# Patient Record
Sex: Male | Born: 1945 | Race: White | Hispanic: No | Marital: Married | Smoking: Never smoker
Health system: Southern US, Community
[De-identification: ages and names within clinical notes are randomized; demographics above are authoritative.]

## PROBLEM LIST (undated history)

## (undated) DIAGNOSIS — J189 Pneumonia, unspecified organism: Secondary | ICD-10-CM

## (undated) DIAGNOSIS — K629 Disease of anus and rectum, unspecified: Secondary | ICD-10-CM

## (undated) DIAGNOSIS — C801 Malignant (primary) neoplasm, unspecified: Secondary | ICD-10-CM

## (undated) DIAGNOSIS — I10 Essential (primary) hypertension: Secondary | ICD-10-CM

## (undated) DIAGNOSIS — M1990 Unspecified osteoarthritis, unspecified site: Secondary | ICD-10-CM

## (undated) DIAGNOSIS — H269 Unspecified cataract: Secondary | ICD-10-CM

## (undated) DIAGNOSIS — K219 Gastro-esophageal reflux disease without esophagitis: Secondary | ICD-10-CM

## (undated) DIAGNOSIS — E785 Hyperlipidemia, unspecified: Secondary | ICD-10-CM

## (undated) DIAGNOSIS — T7840XA Allergy, unspecified, initial encounter: Secondary | ICD-10-CM

## (undated) HISTORY — DX: Essential (primary) hypertension: I10

## (undated) HISTORY — PX: CATARACT EXTRACTION: SUR2

## (undated) HISTORY — PX: MOLE REMOVAL: SHX2046

## (undated) HISTORY — DX: Unspecified osteoarthritis, unspecified site: M19.90

## (undated) HISTORY — PX: NECK MASS EXCISION: SHX2079

## (undated) HISTORY — PX: TONSILLECTOMY: SUR1361

## (undated) HISTORY — DX: Allergy, unspecified, initial encounter: T78.40XA

## (undated) HISTORY — DX: Unspecified cataract: H26.9

## (undated) HISTORY — PX: COLONOSCOPY: SHX174

## (undated) HISTORY — DX: Gastro-esophageal reflux disease without esophagitis: K21.9

## (undated) HISTORY — DX: Hyperlipidemia, unspecified: E78.5

---

## 2010-10-17 LAB — HM COLONOSCOPY

## 2012-04-03 LAB — TSH: TSH: 2.37 u[IU]/mL (ref <=5.90)

## 2013-11-06 LAB — CBC AND DIFFERENTIAL
HEMATOCRIT: 41 % (ref 41–53)
HEMOGLOBIN: 14.7 g/dL (ref 13.5–17.5)
NEUTROS ABS: 59 /uL
Platelets: 210 10*3/uL (ref 150–399)
WBC: 5.8 10^3/mL

## 2013-11-06 LAB — BASIC METABOLIC PANEL
BUN: 18 mg/dL (ref 4–21)
CREATININE: 1.3 mg/dL (ref <=1.3)
GLUCOSE: 91 mg/dL
Potassium: 4.6 mmol/L (ref 3.4–5.3)
SODIUM: 141 mmol/L (ref 137–147)

## 2013-11-06 LAB — HEPATIC FUNCTION PANEL
ALK PHOS: 52 U/L (ref 25–125)
ALT: 12 U/L (ref 10–40)
AST: 14 U/L (ref 14–40)
Bilirubin, Total: 0.5 mg/dL

## 2013-11-06 LAB — LIPID PANEL
Cholesterol: 207 mg/dL — AB (ref 0–200)
HDL: 33 mg/dL — AB (ref 35–70)
LDL CALC: 151 mg/dL
LDL/HDL RATIO: 4.6
Triglycerides: 115 mg/dL (ref 40–160)

## 2014-05-16 LAB — PSA: PSA: 1.9

## 2014-06-04 DIAGNOSIS — M791 Myalgia, unspecified site: Secondary | ICD-10-CM | POA: Insufficient documentation

## 2014-06-04 DIAGNOSIS — K649 Unspecified hemorrhoids: Secondary | ICD-10-CM | POA: Insufficient documentation

## 2014-06-04 DIAGNOSIS — I10 Essential (primary) hypertension: Secondary | ICD-10-CM | POA: Insufficient documentation

## 2014-06-04 DIAGNOSIS — Z23 Encounter for immunization: Secondary | ICD-10-CM | POA: Insufficient documentation

## 2014-06-04 DIAGNOSIS — M25559 Pain in unspecified hip: Secondary | ICD-10-CM | POA: Insufficient documentation

## 2014-06-04 DIAGNOSIS — E7800 Pure hypercholesterolemia, unspecified: Secondary | ICD-10-CM | POA: Insufficient documentation

## 2014-06-04 DIAGNOSIS — E663 Overweight: Secondary | ICD-10-CM | POA: Insufficient documentation

## 2014-06-04 DIAGNOSIS — K589 Irritable bowel syndrome without diarrhea: Secondary | ICD-10-CM | POA: Insufficient documentation

## 2014-06-04 DIAGNOSIS — Z8669 Personal history of other diseases of the nervous system and sense organs: Secondary | ICD-10-CM | POA: Insufficient documentation

## 2014-06-04 DIAGNOSIS — L84 Corns and callosities: Secondary | ICD-10-CM | POA: Insufficient documentation

## 2014-09-23 DIAGNOSIS — I10 Essential (primary) hypertension: Secondary | ICD-10-CM | POA: Insufficient documentation

## 2014-09-23 DIAGNOSIS — Z789 Other specified health status: Secondary | ICD-10-CM | POA: Insufficient documentation

## 2014-09-23 DIAGNOSIS — N400 Enlarged prostate without lower urinary tract symptoms: Secondary | ICD-10-CM | POA: Insufficient documentation

## 2014-09-23 DIAGNOSIS — M1990 Unspecified osteoarthritis, unspecified site: Secondary | ICD-10-CM | POA: Insufficient documentation

## 2014-09-23 DIAGNOSIS — E559 Vitamin D deficiency, unspecified: Secondary | ICD-10-CM | POA: Insufficient documentation

## 2014-09-23 DIAGNOSIS — J309 Allergic rhinitis, unspecified: Secondary | ICD-10-CM | POA: Insufficient documentation

## 2014-09-23 DIAGNOSIS — L821 Other seborrheic keratosis: Secondary | ICD-10-CM | POA: Insufficient documentation

## 2014-09-23 DIAGNOSIS — K219 Gastro-esophageal reflux disease without esophagitis: Secondary | ICD-10-CM | POA: Insufficient documentation

## 2014-09-23 DIAGNOSIS — E7800 Pure hypercholesterolemia, unspecified: Secondary | ICD-10-CM | POA: Insufficient documentation

## 2014-10-30 DIAGNOSIS — Z961 Presence of intraocular lens: Secondary | ICD-10-CM | POA: Diagnosis not present

## 2014-10-30 DIAGNOSIS — H40013 Open angle with borderline findings, low risk, bilateral: Secondary | ICD-10-CM | POA: Diagnosis not present

## 2014-11-25 ENCOUNTER — Ambulatory Visit (INDEPENDENT_AMBULATORY_CARE_PROVIDER_SITE_OTHER): Payer: Medicare PPO | Admitting: Family Medicine

## 2014-11-25 ENCOUNTER — Encounter: Payer: Self-pay | Admitting: Family Medicine

## 2014-11-25 VITALS — BP 142/76 | HR 68 | Temp 98.0°F | Resp 16 | Ht 73.0 in | Wt 218.0 lb

## 2014-11-25 DIAGNOSIS — I10 Essential (primary) hypertension: Secondary | ICD-10-CM

## 2014-11-25 DIAGNOSIS — K219 Gastro-esophageal reflux disease without esophagitis: Secondary | ICD-10-CM

## 2014-11-25 DIAGNOSIS — E785 Hyperlipidemia, unspecified: Secondary | ICD-10-CM | POA: Diagnosis not present

## 2014-11-25 DIAGNOSIS — Z789 Other specified health status: Secondary | ICD-10-CM

## 2014-11-25 DIAGNOSIS — Z889 Allergy status to unspecified drugs, medicaments and biological substances status: Secondary | ICD-10-CM | POA: Diagnosis not present

## 2014-11-25 NOTE — Progress Notes (Signed)
Patient ID: Henry [name], male   DOB: 1945-06-28, 69 y.o.   MRN: [id number]   ORLIN KANN  MRN: [id number] DOB: [birth year]  Subjective:  HPI   1. Essential hypertension Patient is a 69 year old male who presents for follow up of his hypertension.  His last visit was 05/16/14.  His blood pressure  At that visit was 138/88.  No management changes were made at that time.  He reports good compliance and tolerance of his medicine.   - COMPLETE METABOLIC PANEL WITH GFR - TSH - CBC With Differential/Platelet  2. Gastroesophageal reflux disease, esophagitis presence not specified Patient reports that as long as he take the Omeprazole he has no difficulties.  3. Hyperlipidemia  Patient has been intolerant of statins.  He is currently not taking any medications. - Lipid Panel With LDL/HDL Ratio  4. Statin intolerance     Patient Active Problem List   Diagnosis Date Noted  . Allergic rhinitis 09/23/2014  . 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 Acid reflux 09/23/2014  . Hypercholesteremia 09/23/2014  . BP (high blood pressure) 09/23/2014  . Arthritis, degenerative 09/23/2014  . Basal cell papilloma 09/23/2014  . Drug intolerance 09/23/2014  . Avitaminosis D 09/23/2014  . Essential (primary) hypertension 06/04/2014  . Hemorrhoid 06/04/2014  . Adaptive colitis 06/04/2014  . Muscle ache 06/04/2014  . Excess weight 06/04/2014  . Hypercholesterolemia without hypertriglyceridemia 06/04/2014    Past Medical History  Diagnosis Date  . Allergy   . Arthritis   . Cataract   . GERD (gastroesophageal reflux disease)   . Hyperlipidemia   . Hypertension     History   Social History  . Marital Status: Married    Spouse Name: N/A  . Number of Children: N/A  . Years of Education: N/A   Occupational History  . Not on file.   Social History Main Topics  . Smoking status: Never Smoker   . Smokeless tobacco: Not on file  . Alcohol Use: 0.0 oz/week    0 Standard drinks or  equivalent per week     Comment: 2 beers 4-5 times per week  . Drug Use: No  . Sexual Activity: Not on file   Other Topics Concern  . Not on file   Social History Narrative    Outpatient Prescriptions Prior to Visit  Medication Sig Dispense Refill  . aspirin 81 MG tablet Take by mouth.    . cholecalciferol (VITAMIN D) 1000 UNITS tablet Take by mouth.    Marland Kitchen GLUCOSAMINE HCL PO Take by mouth.    Marland Kitchen lisinopril (PRINIVIL,ZESTRIL) 40 MG tablet Take by mouth.    Marland Kitchen omeprazole (PRILOSEC) 20 MG capsule Take by mouth.     No facility-administered medications prior to visit.    Allergies  Allergen Reactions  . Lovastatin Other (See Comments)  . Pravastatin Nausea And Vomiting    joint ache  . Pravastatin Sodium     joint ache  . Simvastatin Other (See Comments)    muscle ache muscle ache  . Statins   . Sulfa Antibiotics Rash and Itching    Review of Systems  Constitutional: Negative.   Respiratory: Negative.   Cardiovascular: Negative.   Neurological: Negative for dizziness and headaches.   Objective:  BP 142/76 mmHg  Pulse 68  Temp(Src) 98 F (36.7 C) (Oral)  Resp 16  Ht 6\' 1"  (1.854 m)  Wt 218 lb (98.884 kg)  BMI 28.77 kg/m2  Physical Exam  Constitutional: He is well-developed,  well-nourished, and in no distress.  Eyes: Conjunctivae and EOM are normal. Pupils are equal, round, and reactive to light.  Neck: Normal range of motion. Neck supple.  Cardiovascular: Normal rate, regular rhythm and normal heart sounds.   Pulmonary/Chest: Effort normal and breath sounds normal.  Skin: Skin is warm and dry.  Psychiatric: Mood, memory, affect and judgment normal.    Assessment and Plan :   1. Essential hypertension Patient stable.  Encouraged to work on exercise.  Will obtain labs today.  Continue medications.  Check readings at home and bring record in on next visit.  2. Gastroesophageal reflux disease, esophagitis presence not specified Well controlled on medication, no  changes.  3. Hyperlipidemia Patient intolerant of statins.  Will check levels today.  May need to add a different agent if levels go up.    4. Statin intolerance Discussed statin intolerance with the patient what it actually means.   Miguel Aschoff MD [facility] 11/25/2014 9:17 AM

## 2014-11-26 LAB — COMPREHENSIVE METABOLIC PANEL
ALT: 15 IU/L (ref 0–44)
AST: 14 IU/L (ref 0–40)
Albumin/Globulin Ratio: 1.7 (ref 1.1–2.5)
Albumin: 4.3 g/dL (ref 3.6–4.8)
Alkaline Phosphatase: 60 IU/L (ref 39–117)
BUN / CREAT RATIO: 14 (ref 10–22)
BUN: 19 mg/dL (ref 8–27)
Bilirubin Total: 0.6 mg/dL (ref 0.0–1.2)
CO2: 24 mmol/L (ref 18–29)
CREATININE: 1.34 mg/dL — AB (ref 0.76–1.27)
Calcium: 9.7 mg/dL (ref 8.6–10.2)
Chloride: 105 mmol/L (ref 97–108)
GFR calc Af Amer: 63 mL/min/{1.73_m2} (ref >59)
GFR calc non Af Amer: 55 mL/min/{1.73_m2} — ABNORMAL LOW (ref >59)
Globulin, Total: 2.6 g/dL (ref 1.5–4.5)
Glucose: 102 mg/dL — ABNORMAL HIGH (ref 65–99)
POTASSIUM: 4.7 mmol/L (ref 3.5–5.2)
Sodium: 142 mmol/L (ref 134–144)
Total Protein: 6.9 g/dL (ref 6.0–8.5)

## 2014-11-26 LAB — LIPID PANEL WITH LDL/HDL RATIO
Cholesterol, Total: 226 mg/dL — ABNORMAL HIGH (ref 100–199)
HDL: 33 mg/dL — AB (ref >39)
LDL Calculated: 169 mg/dL — ABNORMAL HIGH (ref 0–99)
LDL/HDL RATIO: 5.1 ratio — AB (ref 0.0–3.6)
TRIGLYCERIDES: 122 mg/dL (ref 0–149)
VLDL CHOLESTEROL CAL: 24 mg/dL (ref 5–40)

## 2014-11-26 LAB — TSH: TSH: 1.73 u[IU]/mL (ref 0.450–4.500)

## 2014-11-28 LAB — CBC WITH DIFFERENTIAL/PLATELET

## 2015-03-05 ENCOUNTER — Ambulatory Visit (INDEPENDENT_AMBULATORY_CARE_PROVIDER_SITE_OTHER): Payer: Medicare PPO

## 2015-03-05 DIAGNOSIS — Z23 Encounter for immunization: Secondary | ICD-10-CM

## 2015-03-12 DIAGNOSIS — M545 Low back pain, unspecified: Secondary | ICD-10-CM | POA: Insufficient documentation

## 2015-04-17 ENCOUNTER — Telehealth: Payer: Self-pay | Admitting: Family Medicine

## 2015-04-17 ENCOUNTER — Other Ambulatory Visit: Payer: Self-pay

## 2015-04-17 MED ORDER — LISINOPRIL 40 MG PO TABS: 40.0000 mg | ORAL_TABLET | Freq: Every day | ORAL | Status: DC

## 2015-04-17 NOTE — Telephone Encounter (Signed)
Pt contacted office for refill request on the following medications: lisinopril (PRINIVIL,ZESTRIL) 40 MG tablet to [facility] on [address] Thanks TNP

## 2015-04-19 DIAGNOSIS — M545 Low back pain: Secondary | ICD-10-CM | POA: Diagnosis not present

## 2015-05-29 ENCOUNTER — Ambulatory Visit (INDEPENDENT_AMBULATORY_CARE_PROVIDER_SITE_OTHER): Payer: Medicare Other | Admitting: Family Medicine

## 2015-05-29 ENCOUNTER — Encounter: Payer: Self-pay | Admitting: Family Medicine

## 2015-05-29 VITALS — BP 158/76 | HR 78 | Temp 98.1°F | Resp 16 | Ht 73.25 in | Wt 228.0 lb

## 2015-05-29 DIAGNOSIS — N529 Male erectile dysfunction, unspecified: Secondary | ICD-10-CM | POA: Diagnosis not present

## 2015-05-29 DIAGNOSIS — Z0000 Encounter for general adult medical examination without abnormal findings: Secondary | ICD-10-CM | POA: Diagnosis not present

## 2015-05-29 DIAGNOSIS — Z1211 Encounter for screening for malignant neoplasm of colon: Secondary | ICD-10-CM | POA: Diagnosis not present

## 2015-05-29 LAB — IFOBT (OCCULT BLOOD): IFOBT: NEGATIVE

## 2015-05-29 MED ORDER — SILDENAFIL CITRATE 100 MG PO TABS: 50.0000 mg | ORAL_TABLET | Freq: Every day | ORAL | Status: DC | PRN

## 2015-05-29 MED ORDER — OMEPRAZOLE 20 MG PO CPDR: 20.0000 mg | DELAYED_RELEASE_CAPSULE | Freq: Every day | ORAL | Status: DC

## 2015-05-29 MED ORDER — LISINOPRIL 40 MG PO TABS: 40.0000 mg | ORAL_TABLET | Freq: Every day | ORAL | Status: DC

## 2015-05-29 NOTE — Addendum Note (Signed)
Addended by: Arnette Norris on: 05/29/2015 12:31 PM   Modules accepted: Orders

## 2015-05-29 NOTE — Progress Notes (Signed)
Patient ID: Henry [name], male   DOB: [birth year], 70 y.o.   MRN: [id number]  Visit Date: 05/29/2015  Today's Provider: Wilhemena Durie, MD   Chief Complaint  Patient presents with  . Medicare Wellness   Subjective:   Henry [name] is a 70 y.o. male who presents today for his Subsequent Annual Wellness Visit. He feels well. 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 He reports he is sleeping well. Immunization History  Administered Date(s) Administered  . Influenza, High Dose Seasonal PF 03/05/2015  . Pneumococcal Conjugate-13 04/24/2013  . Pneumococcal Polysaccharide-23 05/16/2014  . Tdap 01/19/2005  . Zoster 03/30/2012   LAST Colonoscopy 12/17/10 hemorrhoids, repeat 5 to 10 years.   Review of Systems  Constitutional: Negative.   HENT: Negative.   Eyes: Negative.   Respiratory: Negative.   Cardiovascular: Negative.   Gastrointestinal: Negative.   Endocrine: Negative.   Genitourinary: Negative.   Musculoskeletal: Negative.   Skin: Negative.   Allergic/Immunologic: Negative.   Neurological: Negative.   Hematological: Negative.   Psychiatric/Behavioral: Negative.     Patient Active Problem List   Diagnosis Date Noted  . Low back pain 03/12/2015  . Allergic rhinitis 09/23/2014  . 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 Acid reflux 09/23/2014  . Hypercholesteremia 09/23/2014  . BP (high blood pressure) 09/23/2014  . Arthritis, degenerative 09/23/2014  . Basal cell papilloma 09/23/2014  . Drug intolerance 09/23/2014  . Avitaminosis D 09/23/2014  . Essential (primary) hypertension 06/04/2014  . Hemorrhoid 06/04/2014  . Adaptive colitis 06/04/2014  . Muscle ache 06/04/2014  . Excess weight 06/04/2014  . Hypercholesterolemia without hypertriglyceridemia 06/04/2014    Social History   Social History  . Marital Status: Married    Spouse Name: N/A  . Number of Children: N/A  . Years of Education: N/A    Occupational History  . Not on file.   Social History Main Topics  . Smoking status: Never Smoker   . Smokeless tobacco: Never Used  . Alcohol Use: 0.0 oz/week    0 Standard drinks or equivalent per week     Comment: 2 beers 4-5 times per week  . Drug Use: No  . Sexual Activity: Not on file   Other Topics Concern  . Not on file   Social History Narrative    Past Surgical History  Procedure Laterality Date  . Cataract extraction    . Neck mass excision    . Mole removal    . Tonsillectomy      His family history includes CAD in his mother; COPD in his sister; Heart attack in his father; Heart disease in his mother; Rheumatic fever in his mother.    Outpatient Prescriptions Prior to Visit  Medication Sig Dispense Refill  . aspirin 81 MG tablet Take by mouth.    . cholecalciferol (VITAMIN D) 1000 UNITS tablet Take by mouth.    Marland Kitchen GLUCOSAMINE HCL PO Take by mouth.    Marland Kitchen lisinopril (PRINIVIL,ZESTRIL) 40 MG tablet Take 1 tablet (40 mg total) by mouth daily. 30 tablet 12  . omeprazole (PRILOSEC) 20 MG capsule Take by mouth.     No facility-administered medications prior to visit.    Allergies  Allergen Reactions  . Lovastatin Other (See Comments)  . Pravastatin Nausea And Vomiting    joint ache  . Pravastatin Sodium     joint ache  . Simvastatin Other (See Comments)    muscle ache  muscle ache  . Statins   . Sulfa Antibiotics Rash and Itching    Patient Care Team: Jerrol Banana., MD as PCP - General (Family Medicine)  Objective:   Vitals:  Filed Vitals:   05/29/15 0910  BP: 158/76  Pulse: 78  Temp: 98.1 F (36.7 C)  Resp: 16  Height: 6' 1.25" (1.861 m)  Weight: 228 lb (103.42 kg)    Physical Exam  Activities of Daily Living In your present state of health, do you have any difficulty performing the following activities: 05/29/2015  Hearing? Y  Vision? N  Difficulty concentrating or making decisions? N  Walking or climbing stairs? N   Dressing or bathing? N  Doing errands, shopping? N    Fall Risk Assessment Fall Risk  05/29/2015  Falls in the past year? No     Depression Screen PHQ 2/9 Scores 05/29/2015  PHQ - 2 Score 0    Cognitive Testing - 6-CIT    Year: 0 4 points  Month: 0 3 points  Memorize "Pia Mau, [address] [place]"  Time (within 1 hour:) 0 3 points  Count backwards from 20: 0 2 4 points  Name months of year: 0 2 4 points  Repeat Address: 0 2 4 6 8 10  points   Total Score: 6/28  Interpretation : Normal (0-7) Abnormal (8-28)    Assessment & Plan:     Annual Wellness Visit  Reviewed patient's Family Medical History Reviewed and updated list of patient's medical providers Assessment of cognitive impairment was done Assessed patient's functional ability Established a written schedule for health screening [place] Completed and Reviewed   ED Discussed issue at length. Try Viagra 100 mg daily when necessary. #5 with year refills. Chronic low back pain [facility] 05/29/2015 9:10 AM  ------------------------------------------------------------------------------------------------------------

## 2015-06-26 ENCOUNTER — Other Ambulatory Visit: Payer: Self-pay

## 2015-06-26 MED ORDER — SILDENAFIL CITRATE 20 MG PO TABS: ORAL_TABLET | ORAL | Status: DC

## 2015-07-23 ENCOUNTER — Other Ambulatory Visit: Payer: Self-pay | Admitting: Family Medicine

## 2015-07-23 NOTE — Telephone Encounter (Signed)
Pt contacted office for refill request on the following medications: sildenafil (REVATIO) 20 MG tablet Pt stated that RX was written for 10 pills and he would like it to be changed to 25 pills per refill sent to [facility]. I advised pt that Dr. Rosanna Randy is out of the office until Monday 07/28/15. Pt stated no hurry. Please advise. Thanks TNP

## 2015-07-23 NOTE — Telephone Encounter (Signed)
Please review-aa 

## 2015-07-24 ENCOUNTER — Other Ambulatory Visit: Payer: Self-pay | Admitting: Family Medicine

## 2015-07-24 NOTE — Telephone Encounter (Signed)
For review by Dr. Rosanna Randy when he is back in the office.

## 2015-07-28 MED ORDER — SILDENAFIL CITRATE 20 MG PO TABS: ORAL_TABLET | ORAL | Status: DC

## 2015-07-28 NOTE — Telephone Encounter (Signed)
Done-aa 

## 2015-07-28 NOTE — Telephone Encounter (Signed)
Ok to change to 25 tabs.12rf.

## 2015-09-30 ENCOUNTER — Ambulatory Visit (INDEPENDENT_AMBULATORY_CARE_PROVIDER_SITE_OTHER): Payer: Medicare Other | Admitting: Family Medicine

## 2015-09-30 VITALS — BP 146/92 | HR 64 | Temp 98.3°F | Resp 16 | Wt 228.0 lb

## 2015-09-30 DIAGNOSIS — N529 Male erectile dysfunction, unspecified: Secondary | ICD-10-CM | POA: Diagnosis not present

## 2015-09-30 DIAGNOSIS — I10 Essential (primary) hypertension: Secondary | ICD-10-CM | POA: Diagnosis not present

## 2015-09-30 DIAGNOSIS — E663 Overweight: Secondary | ICD-10-CM

## 2015-09-30 DIAGNOSIS — M545 Low back pain: Secondary | ICD-10-CM

## 2015-09-30 DIAGNOSIS — G8929 Other chronic pain: Secondary | ICD-10-CM

## 2015-09-30 NOTE — Progress Notes (Signed)
Patient ID: Henry [name], male   DOB: 05-05-1946, 70 y.o.   MRN: [id number]    Subjective:  HPI  Patient is here to follow up on chronic back pain. He has had a back pain that has been re current for 8 years, pain located in the lower back. Flare ups vary. 1 year ago he had daily back pain all summer long, but the next summer he felt fine. Recent episode started 3 weeks ago. He has been taking Ibuprofen. He has never had back surgeries or imaging done before. He received tens unit from [facility] place but has not been able to use it because he states he does not have anyone to help him with this. He denies leg weakness, any numbness or tingling.  He use to have chronic knee pain also for 20 years that was constant but that has resolved and he feels fine with that.  Prior to Admission medications   Medication Sig Start Date End Date Taking? Authorizing Provider  aspirin 81 MG tablet Take by mouth. 10/02/12  Yes Historical Provider, MD  cetirizine (ZYRTEC) 10 MG tablet Take 10 mg by mouth daily.   Yes Historical Provider, MD  cholecalciferol (VITAMIN D) 1000 UNITS tablet Take by mouth. 10/02/12  Yes Historical Provider, MD  GLUCOSAMINE HCL PO Take by mouth. 10/02/12  Yes Historical Provider, MD  lisinopril (PRINIVIL,ZESTRIL) 40 MG tablet Take 1 tablet (40 mg total) by mouth daily. 05/29/15  Yes Richard Maceo Pro., MD  omeprazole (PRILOSEC) 20 MG capsule Take 1 capsule (20 mg total) by mouth daily. 05/29/15  Yes Richard Maceo Pro., MD  sildenafil (REVATIO) 20 MG tablet Up to 5 tablets daily as needed 07/28/15  Yes Richard Maceo Pro., MD    Patient Active Problem List   Diagnosis Date Noted  . Low back pain 03/12/2015  . Allergic rhinitis 09/23/2014  . 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 Acid reflux 09/23/2014  . Hypercholesteremia 09/23/2014  . BP (high blood pressure) 09/23/2014  . Arthritis, degenerative 09/23/2014  . Basal cell papilloma 09/23/2014  . Drug intolerance  09/23/2014  . Avitaminosis D 09/23/2014  . Essential (primary) hypertension 06/04/2014  . Hemorrhoid 06/04/2014  . Adaptive colitis 06/04/2014  . Muscle ache 06/04/2014  . Excess weight 06/04/2014  . Hypercholesterolemia without hypertriglyceridemia 06/04/2014    Past Medical History  Diagnosis Date  . Allergy   . Arthritis   . Cataract   . GERD (gastroesophageal reflux disease)   . Hyperlipidemia   . Hypertension     Social History   Social History  . Marital Status: Married    Spouse Name: N/A  . Number of Children: N/A  . Years of Education: N/A   Occupational History  . Not on file.   Social History Main Topics  . Smoking status: Never Smoker   . Smokeless tobacco: Never Used  . Alcohol Use: 0.0 oz/week    0 Standard drinks or equivalent per week     Comment: 2 beers 4-5 times per week  . Drug Use: No  . Sexual Activity: Not on file   Other Topics Concern  . Not on file   Social History Narrative    Allergies  Allergen Reactions  . Lovastatin Other (See Comments)  . Pravastatin Nausea And Vomiting    joint ache  . Pravastatin Sodium     joint ache  . Simvastatin Other (See Comments)    muscle ache muscle ache  . Statins   .  Sulfa Antibiotics Rash and Itching    Review of Systems  Constitutional: Negative.   Respiratory: Negative.   Cardiovascular: Negative.   Gastrointestinal: Negative.   Musculoskeletal: Positive for back pain.  Psychiatric/Behavioral: Negative.     Immunization History  Administered Date(s) Administered  . Influenza, High Dose Seasonal PF 03/05/2015  . Pneumococcal Conjugate-13 04/24/2013  . Pneumococcal Polysaccharide-23 05/16/2014  . Tdap 01/19/2005  . Zoster 03/30/2012   Objective:  BP 146/92 mmHg  Pulse 64  Temp(Src) 98.3 F (36.8 C)  Resp 16  Wt 228 lb (103.42 kg)  Physical Exam  Constitutional: He is oriented to person, place, and time and well-developed, well-nourished, and in no distress.  HENT:    Head: Normocephalic and atraumatic.  Eyes: Conjunctivae are normal. Pupils are equal, round, and reactive to light.  Neck: Normal range of motion. Neck supple.  Cardiovascular: Normal rate, regular rhythm, normal heart sounds and intact distal pulses.   No murmur heard. Pulmonary/Chest: Breath sounds normal. No respiratory distress. He has no wheezes.  Musculoskeletal: He exhibits no edema or tenderness.  Above SI joint discomfort on the left but no tenderness today on the exam, negative straight leg raise.  Neurological: He is alert and oriented to person, place, and time.  Psychiatric: Mood, memory, affect and judgment normal.    Lab Results  Component Value Date   WBC CANCELED 11/25/2014   HGB 14.7 11/06/2013   HCT CANCELED 11/25/2014   PLT CANCELED 11/25/2014   GLUCOSE 102* 11/25/2014   CHOL 226* 11/25/2014   TRIG 122 11/25/2014   HDL 33* 11/25/2014   LDLCALC 169* 11/25/2014   TSH 1.730 11/25/2014   PSA 1.9 05/16/2014    CMP     Component Value Date/Time   NA 142 11/25/2014 0925   K 4.7 11/25/2014 0925   CL 105 11/25/2014 0925   CO2 24 11/25/2014 0925   GLUCOSE 102* 11/25/2014 0925   BUN 19 11/25/2014 0925   CREATININE 1.34* 11/25/2014 0925   CREATININE 1.3 11/06/2013   CALCIUM 9.7 11/25/2014 0925   PROT 6.9 11/25/2014 0925   ALBUMIN 4.3 11/25/2014 0925   AST 14 11/25/2014 0925   ALT 15 11/25/2014 0925   ALKPHOS 60 11/25/2014 0925   BILITOT 0.6 11/25/2014 0925   GFRNONAA 55* 11/25/2014 0925   GFRAA 63 11/25/2014 0925    Assessment and Plan :  1. Chronic low back pain Encouraged patient to use tens unit and get his wife to help him apply it. Flare ups vary for this issue. Advised patient he can use Ibuprofen no more than half of the days of the month. Follow. Physical therapy certainly can help and a TENS unit can probably be very helpful. Rx Written for TENS unit 2. Excess weight Work on habits, loosing weight can help back pain also.  3. Essential  (primary) hypertension Elevated today. Patient is up 10 lbs, encouraged patient to walk and watch his b/p. Will re access on the next visit, may need to change medication then. Follow.  Patient was seen and examined by Dr. Eulas Post and note was scribed by Theressa Millard, RMA.    Miguel Aschoff MD [facility] 09/30/2015 9:41 AM

## 2015-12-10 ENCOUNTER — Telehealth: Payer: Self-pay

## 2015-12-10 NOTE — Telephone Encounter (Signed)
Nothing else that I know of. If he can put septum times a week and question the patient when he comes in next visit that be great. thank you

## 2015-12-10 NOTE — Telephone Encounter (Signed)
Spoke with Henry [name] from [place] in regards to the TENS Unit, we send them office notes about patient's back pain to get the unit covered. She states medicare will not cover the unit for back pain there has to be some other pain like in the legs or knees or having cervical/thoracic pain. Per last note when we saw him he denied having any other issues except the chronic back pain in the lumbar region. I wanted to let you know. She needs a call back letting her know if there is something else we missed in the note that would help get this covered if not I guess they will close the case, I am not sure I told her I would call her back. CB: [phone]

## 2015-12-10 NOTE — Telephone Encounter (Signed)
thx

## 2015-12-17 ENCOUNTER — Ambulatory Visit: Payer: Self-pay | Admitting: Family Medicine

## 2015-12-24 ENCOUNTER — Telehealth: Payer: Self-pay | Admitting: Family Medicine

## 2015-12-24 NOTE — Telephone Encounter (Signed)
Henry [name] with [facility] would like a letter on our letter head stating that the order for the Tens Unit was for back pain and the knee pain was written in error. She would like if faxed attention [facility] and faxed to [phone]. Please advise. Thanks TNP

## 2015-12-25 NOTE — Telephone Encounter (Signed)
Ok to write that--notes are for chronic back pain. TENS unit requesting came  I think with knee pain written on it

## 2015-12-25 NOTE — Telephone Encounter (Signed)
Letter is typed, waiting on approval from Dr. Rosanna Randy.  ED

## 2015-12-25 NOTE — Telephone Encounter (Signed)
Done and faxed ED

## 2015-12-25 NOTE — Telephone Encounter (Signed)
Please review. Thanks!  

## 2016-03-18 ENCOUNTER — Encounter: Payer: Self-pay | Admitting: Family Medicine

## 2016-03-18 ENCOUNTER — Ambulatory Visit (INDEPENDENT_AMBULATORY_CARE_PROVIDER_SITE_OTHER): Payer: Medicare Other | Admitting: Family Medicine

## 2016-03-18 VITALS — BP 142/78 | HR 70 | Temp 97.9°F | Resp 18 | Wt 230.0 lb

## 2016-03-18 DIAGNOSIS — Z23 Encounter for immunization: Secondary | ICD-10-CM

## 2016-03-18 DIAGNOSIS — L308 Other specified dermatitis: Secondary | ICD-10-CM | POA: Diagnosis not present

## 2016-03-18 DIAGNOSIS — I10 Essential (primary) hypertension: Secondary | ICD-10-CM | POA: Diagnosis not present

## 2016-03-18 DIAGNOSIS — S39012A Strain of muscle, fascia and tendon of lower back, initial encounter: Secondary | ICD-10-CM

## 2016-03-18 MED ORDER — AMLODIPINE BESYLATE 5 MG PO TABS: 5.0000 mg | ORAL_TABLET | Freq: Every day | ORAL | 3 refills | Status: DC

## 2016-03-18 MED ORDER — TRIAMCINOLONE ACETONIDE 0.1 % EX CREA: 1.0000 "application " | TOPICAL_CREAM | Freq: Two times a day (BID) | CUTANEOUS | 0 refills | Status: DC

## 2016-03-18 NOTE — Progress Notes (Signed)
Subjective:  HPI  Hypertension, follow-up:  BP Readings from Last 3 Encounters:  03/18/16 (!) 142/78  09/30/15 (!) 146/92  05/29/15 (!) 158/76    He was last seen for hypertension 6 months ago.  BP at that visit was 146/92. Management since that visit includes none. He reports good compliance with treatment. He is not having side effects.  He is exercising. Walks a lot and gardening. He is adherent to low salt diet.   Outside blood pressures are not being checked. He is experiencing none.  Patient denies chest pain, chest pressure/discomfort, claudication, dyspnea, exertional chest pressure/discomfort, fatigue, irregular heart beat, lower extremity edema, near-syncope, orthopnea, palpitations, paroxysmal nocturnal dyspnea and syncope.   Cardiovascular risk factors include advanced age (older than 71 for men, 30 for women), hypertension and male gender.   Wt Readings from Last 3 Encounters:  03/18/16 230 lb (104.3 kg)  09/30/15 228 lb (103.4 kg)  05/29/15 228 lb (103.4 kg)   ------------------------------------------------------------------------   Back pain- Pt reports that he hurt his back while riding on a boat. But the pain did not start until the day after while he was getting out of a chair. Back pain started 4 days ago.  He reports that the back pain is worse in the mornings and the more he moves the better it feels. He says sometimes it runs into his hip. He has tried Ibuprofen so this and it has not helped.   Prior to Admission medications   Medication Sig Start Date End Date Taking? Authorizing Provider  aspirin 81 MG tablet Take by mouth. 10/02/12   Historical Provider, MD  cetirizine (ZYRTEC) 10 MG tablet Take 10 mg by mouth daily.    Historical Provider, MD  cholecalciferol (VITAMIN D) 1000 UNITS tablet Take by mouth. 10/02/12   Historical Provider, MD  GLUCOSAMINE HCL PO Take by mouth. 10/02/12   Historical Provider, MD  lisinopril (PRINIVIL,ZESTRIL) 40 MG tablet  Take 1 tablet (40 mg total) by mouth daily. 05/29/15   Richard Maceo Pro., MD  omeprazole (PRILOSEC) 20 MG capsule Take 1 capsule (20 mg total) by mouth daily. 05/29/15   Richard Maceo Pro., MD  sildenafil (REVATIO) 20 MG tablet Up to 5 tablets daily as needed 07/28/15   Jerrol Banana., MD    Patient Active Problem List   Diagnosis Date Noted  . Low back pain 03/12/2015  . Allergic rhinitis 09/23/2014  . 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 Acid reflux 09/23/2014  . Hypercholesteremia 09/23/2014  . BP (high blood pressure) 09/23/2014  . Arthritis, degenerative 09/23/2014  . Basal cell papilloma 09/23/2014  . Drug intolerance 09/23/2014  . Avitaminosis D 09/23/2014  . Essential (primary) hypertension 06/04/2014  . Hemorrhoid 06/04/2014  . Adaptive colitis 06/04/2014  . Muscle ache 06/04/2014  . Excess weight 06/04/2014  . Hypercholesterolemia without hypertriglyceridemia 06/04/2014    Past Medical History:  Diagnosis Date  . Allergy   . Arthritis   . Cataract   . GERD (gastroesophageal reflux disease)   . Hyperlipidemia   . Hypertension     Social History   Social History  . Marital status: Married    Spouse name: N/A  . Number of children: N/A  . Years of education: N/A   Occupational History  . Not on file.   Social History Main Topics  . Smoking status: Never Smoker  . Smokeless tobacco: Never Used  . Alcohol use 0.0 oz/week     Comment: 2  beers 4-5 times per week  . Drug use: No  . Sexual activity: Not on file   Other Topics Concern  . Not on file   Social History Narrative  . No narrative on file    Allergies  Allergen Reactions  . Lovastatin Other (See Comments)  . Pravastatin Nausea And Vomiting    joint ache  . Pravastatin Sodium     joint ache  . Simvastatin Other (See Comments)    muscle ache muscle ache  . Statins   . Sulfa Antibiotics Rash and Itching    Review of Systems  Constitutional: Negative.   HENT: Negative.    Eyes: Negative.   Respiratory: Negative.   Cardiovascular: Negative.   Gastrointestinal: Negative.   Genitourinary: Negative.   Musculoskeletal: Positive for back pain.  Skin: Negative.   Neurological: Negative.   Endo/Heme/Allergies: Negative.   Psychiatric/Behavioral: Negative.     Immunization History  Administered Date(s) Administered  . Influenza, High Dose Seasonal PF 03/05/2015  . Pneumococcal Conjugate-13 04/24/2013  . Pneumococcal Polysaccharide-23 05/16/2014  . Tdap 01/19/2005  . Zoster 03/30/2012   Objective:  BP (!) 142/78 (BP Location: Left Arm, Patient Position: Sitting, Cuff Size: Normal)   Pulse 70   Temp 97.9 F (36.6 C) (Oral)   Resp 18   Wt 230 lb (104.3 kg)   BMI 30.14 kg/m   Physical Exam  Constitutional: He is oriented to person, place, and time and well-developed, well-nourished, and in no distress.  HENT:  Head: Normocephalic and atraumatic.  Right Ear: External ear normal.  Left Ear: External ear normal.  Nose: Nose normal.  Eyes: Conjunctivae and EOM are normal. Pupils are equal, round, and reactive to light.  Neck: Normal range of motion. Neck supple.  Cardiovascular: Normal rate, regular rhythm, normal heart sounds and intact distal pulses.   Pulmonary/Chest: Effort normal and breath sounds normal.  Abdominal: Soft.  Musculoskeletal: Normal range of motion. He exhibits no tenderness.  Neurological: He is alert and oriented to person, place, and time. He has normal reflexes. Gait normal. GCS score is 15.  Skin: Skin is warm and dry.  Mild eczema behind the left ear.  Psychiatric: Mood, memory, affect and judgment normal.    Lab Results  Component Value Date   WBC CANCELED 11/25/2014   HGB 14.7 11/06/2013   HCT CANCELED 11/25/2014   PLT CANCELED 11/25/2014   GLUCOSE 102 (H) 11/25/2014   CHOL 226 (H) 11/25/2014   TRIG 122 11/25/2014   HDL 33 (L) 11/25/2014   LDLCALC 169 (H) 11/25/2014   TSH 1.730 11/25/2014   PSA 1.9 05/16/2014      CMP     Component Value Date/Time   NA 142 11/25/2014 0925   K 4.7 11/25/2014 0925   CL 105 11/25/2014 0925   CO2 24 11/25/2014 0925   GLUCOSE 102 (H) 11/25/2014 0925   BUN 19 11/25/2014 0925   CREATININE 1.34 (H) 11/25/2014 0925   CALCIUM 9.7 11/25/2014 0925   PROT 6.9 11/25/2014 0925   ALBUMIN 4.3 11/25/2014 0925   AST 14 11/25/2014 0925   ALT 15 11/25/2014 0925   ALKPHOS 60 11/25/2014 0925   BILITOT 0.6 11/25/2014 0925   GFRNONAA 55 (L) 11/25/2014 0925   GFRAA 63 11/25/2014 0925    Assessment and Plan :  1. Essential hypertension Elevated the last 3 OV. Will add Amlodipine. Follow up 2 months.  - amLODipine (NORVASC) 5 MG tablet; Take 1 tablet (5 mg total) by mouth daily.  Dispense:  90 tablet; Refill: 3  2. Need for influenza vaccination  - Flu vaccine HIGH DOSE PF  3. Strain of lumbar region, initial encounter More than likely muscular. Instructed to use heating pad and call back if he does not continue to improve.  Recurrent issue. Physical therapy referral may be helpful in the future.  4. Other eczema  - triamcinolone cream (KENALOG) 0.1 %; Apply 1 application topically 2 (two) times daily. To affected area. Only use for a week at a time.  Dispense: 30 g; Refill: 0   HPI, Exam, and A&P Transcribed under the direction and in the presence of Richard L. Cranford Mon, MD  Electronically Signed: Webb Laws, CMA I have done the exam and reviewed the above chart and it is accurate to the best of my knowledge.  Miguel Aschoff MD [facility] 03/18/2016 8:25 AM

## 2016-04-02 ENCOUNTER — Telehealth: Payer: Self-pay | Admitting: Family Medicine

## 2016-04-02 NOTE — Telephone Encounter (Signed)
Pt has prescribed amLODipine (NORVASC) 5 MG tablet.  After he started taking it for several days.  He started having diarrhea.  He stopped the medication and the diarrhea stopped.  He does not know if it is coincidental or not.  He just wants to know if this would be a side effect of the amlodipine and if he should try to take it again and see what happens.  He does not want to have diarrhea though as a side effect.  He knows Dr. Rosanna Randy is out if the office for a week.  Thanks Con Memos

## 2016-04-02 NOTE — Telephone Encounter (Signed)
Please review and advise if you can-aa

## 2016-04-05 NOTE — Telephone Encounter (Signed)
Likely coincidental, but not impossible. Patient my try starting amlodipine again to see if it has the same effect. If it happens again, it will likely get better with time. But if it is intolerable to patient, he may stop the drug if he wishes and discuss alternatives with Dr. Rosanna Randy.

## 2016-04-05 NOTE — Telephone Encounter (Signed)
Please call patient Henry [name] sent it to me but I am not there today.-aa

## 2016-04-06 NOTE — Telephone Encounter (Signed)
Pt informed

## 2016-06-02 ENCOUNTER — Encounter: Payer: Self-pay | Admitting: Family Medicine

## 2016-07-01 ENCOUNTER — Other Ambulatory Visit: Payer: Self-pay | Admitting: Family Medicine

## 2016-07-15 ENCOUNTER — Encounter: Payer: Self-pay | Admitting: Family Medicine

## 2016-07-27 ENCOUNTER — Encounter: Payer: Medicare Other | Admitting: Family Medicine

## 2016-08-03 ENCOUNTER — Ambulatory Visit (INDEPENDENT_AMBULATORY_CARE_PROVIDER_SITE_OTHER): Payer: Medicare Other | Admitting: Family Medicine

## 2016-08-03 VITALS — BP 118/80 | HR 80 | Temp 98.3°F | Resp 16 | Ht 73.0 in | Wt 224.0 lb

## 2016-08-03 DIAGNOSIS — Z0000 Encounter for general adult medical examination without abnormal findings: Secondary | ICD-10-CM

## 2016-08-03 DIAGNOSIS — E7800 Pure hypercholesterolemia, unspecified: Secondary | ICD-10-CM

## 2016-08-03 DIAGNOSIS — I10 Essential (primary) hypertension: Secondary | ICD-10-CM | POA: Diagnosis not present

## 2016-08-03 DIAGNOSIS — Z1211 Encounter for screening for malignant neoplasm of colon: Secondary | ICD-10-CM

## 2016-08-03 LAB — IFOBT (OCCULT BLOOD): IFOBT: NEGATIVE

## 2016-08-03 NOTE — Progress Notes (Signed)
Patient: Henry [name], Male    DOB: 02-21-1946, 71 y.o.   MRN: [id number] Visit Date: 08/03/2016  Today's Provider: Wilhemena Durie, MD   Chief Complaint  Patient presents with  . Annual Exam   Subjective:  Henry [name] is a 71 y.o. male who presents today for health maintenance and complete physical. He feels well. He reports exercising none. He reports he is sleeping well. Family History --Mother died at 12 with Rheumatic Fever.Father died at 34 with ALS.  Immunization History  Administered Date(s) Administered  . Influenza, High Dose Seasonal PF 03/05/2015, 03/18/2016  . Pneumococcal Conjugate-13 04/24/2013  . Pneumococcal Polysaccharide-23 05/16/2014  . Tdap 01/19/2005  . Zoster 03/30/2012   12/27/10/ Colonoscopy-normal  Review of Systems  Constitutional: Negative.   HENT: Positive for tinnitus.   Eyes: Negative.   Respiratory: Negative.   Cardiovascular: Negative.   Gastrointestinal: Negative.   Endocrine: Negative.   Musculoskeletal: Negative.   Skin: Negative.   Allergic/Immunologic: Negative.   Neurological: Negative.   Hematological: Negative.   Psychiatric/Behavioral: Negative.     Social History   Social History  . Marital status: Married    Spouse name: N/A  . Number of children: N/A  . Years of education: N/A   Occupational History  . Not on file.   Social History Main Topics  . Smoking status: Never Smoker  . Smokeless tobacco: Never Used  . Alcohol use 0.0 oz/week     Comment: 2 beers 4-5 times per week  . Drug use: No  . Sexual activity: Not on file   Other Topics Concern  . Not on file   Social History Narrative  . No narrative on file    Patient Active Problem List   Diagnosis Date Noted  . Low back pain 03/12/2015  . Allergic rhinitis 09/23/2014  . 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 Acid reflux 09/23/2014  . Hypercholesteremia 09/23/2014  . BP (high blood pressure) 09/23/2014  . Arthritis, degenerative 09/23/2014   . Basal cell papilloma 09/23/2014  . Drug intolerance 09/23/2014  . Avitaminosis D 09/23/2014  . Essential (primary) hypertension 06/04/2014  . Hemorrhoid 06/04/2014  . Adaptive colitis 06/04/2014  . Muscle ache 06/04/2014  . Excess weight 06/04/2014  . Hypercholesterolemia without hypertriglyceridemia 06/04/2014    Past Surgical History:  Procedure Laterality Date  . CATARACT EXTRACTION    . MOLE REMOVAL    . NECK MASS EXCISION    . TONSILLECTOMY      His family history includes CAD in his mother; COPD in his sister; Heart attack in his father; Heart disease in his mother; Rheumatic fever in his mother.     Outpatient Encounter Prescriptions as of 08/03/2016  Medication Sig Note  . amLODipine (NORVASC) 5 MG tablet Take 1 tablet (5 mg total) by mouth daily.   Marland Kitchen aspirin 81 MG tablet Take by mouth. 09/23/2014: Received from: [facility]  . cetirizine (ZYRTEC) 10 MG tablet Take 10 mg by mouth daily.   . cholecalciferol (VITAMIN D) 1000 UNITS tablet Take by mouth. 09/23/2014: Received from: [facility]  . GLUCOSAMINE HCL PO Take by mouth. 09/23/2014: Received from: [facility]  . lisinopril (PRINIVIL,ZESTRIL) 40 MG tablet TAKE 1 TABLET (40 MG TOTAL) BY MOUTH DAILY.   Marland Kitchen omeprazole (PRILOSEC) 20 MG capsule TAKE 1 CAPSULE (20 MG TOTAL) BY MOUTH DAILY.   . sildenafil (REVATIO) 20 MG tablet Up to 5 tablets daily as needed   . triamcinolone cream (KENALOG) 0.1 %  Apply 1 application topically 2 (two) times daily. To affected area. Only use for a week at a time.    No facility-administered encounter medications on file as of 08/03/2016.     Patient Care Team: Jerrol Banana., MD as PCP - General (Family Medicine)      Objective:   Vitals:  Vitals:   08/03/16 1030  BP: 118/80  Pulse: 80  Resp: 16  Temp: 98.3 F (36.8 C)  TempSrc: Oral  Weight: 224 lb (101.6 kg)  Height: 6\' 1"  (1.854 m)    Physical Exam  Constitutional:  He is oriented to person, place, and time. He appears well-developed and well-nourished.  HENT:  Head: Normocephalic and atraumatic.  Right Ear: External ear normal.  Left Ear: External ear normal.  Nose: Nose normal.  Mouth/Throat: Oropharynx is clear and moist.  Eyes: Conjunctivae and EOM are normal. Pupils are equal, round, and reactive to light.  Neck: Normal range of motion. Neck supple.  Cardiovascular: Normal rate, regular rhythm, normal heart sounds and intact distal pulses.   Pulmonary/Chest: Effort normal and breath sounds normal.  Abdominal: Soft. Bowel sounds are normal.  Diathesis recti vs nontender ventral hernia.  Genitourinary: Rectum normal, prostate normal and penis normal.  Musculoskeletal: Normal range of motion.  Neurological: He is alert and oriented to person, place, and time.  Skin: Skin is warm and dry.  Psychiatric: He has a normal mood and affect. His behavior is normal. Judgment and thought content normal.   Current Exercise Habits: The patient does not participate in regular exercise at present    Fall Risk  08/03/2016 05/29/2015  Falls in the past year? No No    Depression Screen PHQ 2/9 Scores 08/03/2016 05/29/2015  PHQ - 2 Score 0 0  PHQ- 9 Score 0 -   Functional Status Survey: Is the patient deaf or have difficulty hearing?: Yes Does the patient have difficulty seeing, even when wearing glasses/contacts?: No Does the patient have difficulty concentrating, remembering, or making decisions?: No Does the patient have difficulty walking or climbing stairs?: No Does the patient have difficulty dressing or bathing?: No Does the patient have difficulty doing errands alone such as visiting a doctor's office or shopping?: No  Cognitive Testing - 6-CIT  Correct? Score   What year is it? yes 0 0 or 4  6What month is it? yes 0 0 or 3  Memorize:    Henry [name],  44,  [address]  [place],      What time is it? (within 1 hour) yes 0 0 or 3  Count backwards  from 20 yes 0 0, 2, or 4  Name the months of the year yes 0 0, 2, or 4  Repeat name & address above yes 0 0, 2, 4, 6, 8, or 10       TOTAL SCORE   6/28   Interpretation:  Normal  Normal (0-7) Abnormal (8-28)     Assessment & Plan:     Routine Health Maintenance and Physical Exam  Exercise Activities and Dietary recommendations Goals    None      Immunization History  Administered Date(s) Administered  . Influenza, High Dose Seasonal PF 03/05/2015, 03/18/2016  . Pneumococcal Conjugate-13 04/24/2013  . Pneumococcal Polysaccharide-23 05/16/2014  . Tdap 01/19/2005  . Zoster 03/30/2012    Health Maintenance  Topic Date Due  . Hepatitis C Screening  06/21/1945  . COLONOSCOPY  03/11/1998  . TETANUS/TDAP  01/20/2015  . INFLUENZA VACCINE  Completed  . PNA vac Low Risk Adult  Completed     Discussed health benefits of physical activity, and encouraged him to engage in regular exercise appropriate for his age and condition.    1. Visit for annual health examination  2. Essential (primary) hypertension  - CBC with Differential/Platelet - Comprehensive metabolic panel - TSH  3. Hypercholesteremia  - Lipid Panel With LDL/HDL Ratio  4. Colon cancer screening  - IFOBT POC (occult bld, rslt in office)   HPI, Exam and A&P Transcribed under the direction and in the presence of Miguel Aschoff, Brooke Bonito., MD. Electronically Signed: Althea Charon, RMA I have done the exam and reviewed the chart and it is accurate to the best of my knowledge. Development worker, community has been used and  any errors in dictation or transcription are unintentional. Miguel Aschoff M.D. [facility]

## 2016-08-04 LAB — COMPREHENSIVE METABOLIC PANEL
A/G RATIO: 1.6 (ref 1.2–2.2)
ALT: 12 IU/L (ref 0–44)
AST: 17 IU/L (ref 0–40)
Albumin: 4.2 g/dL (ref 3.6–4.8)
Alkaline Phosphatase: 74 IU/L (ref 39–117)
BUN/Creatinine Ratio: 11 (ref 10–24)
BUN: 14 mg/dL (ref 8–27)
Bilirubin Total: 0.5 mg/dL (ref 0.0–1.2)
CO2: 27 mmol/L (ref 18–29)
CREATININE: 1.31 mg/dL — AB (ref 0.76–1.27)
Calcium: 9.7 mg/dL (ref 8.6–10.2)
Chloride: 104 mmol/L (ref 96–106)
GFR, EST AFRICAN AMERICAN: 64 mL/min/{1.73_m2} (ref >59)
GFR, EST NON AFRICAN AMERICAN: 56 mL/min/{1.73_m2} — AB (ref >59)
GLOBULIN, TOTAL: 2.7 g/dL (ref 1.5–4.5)
GLUCOSE: 92 mg/dL (ref 65–99)
Potassium: 4.6 mmol/L (ref 3.5–5.2)
SODIUM: 143 mmol/L (ref 134–144)
TOTAL PROTEIN: 6.9 g/dL (ref 6.0–8.5)

## 2016-08-04 LAB — CBC WITH DIFFERENTIAL/PLATELET
BASOS ABS: 0.1 10*3/uL (ref 0.0–0.2)
BASOS: 1 %
EOS (ABSOLUTE): 0.2 10*3/uL (ref 0.0–0.4)
Eos: 4 %
HEMOGLOBIN: 15 g/dL (ref 13.0–17.7)
Hematocrit: 44.4 % (ref 37.5–51.0)
IMMATURE GRANS (ABS): 0 10*3/uL (ref 0.0–0.1)
IMMATURE GRANULOCYTES: 0 %
LYMPHS: 22 %
Lymphocytes Absolute: 1.4 10*3/uL (ref 0.7–3.1)
MCH: 30.5 pg (ref 26.6–33.0)
MCHC: 33.8 g/dL (ref 31.5–35.7)
MCV: 90 fL (ref 79–97)
MONOCYTES: 7 %
Monocytes Absolute: 0.5 10*3/uL (ref 0.1–0.9)
NEUTROS ABS: 4.2 10*3/uL (ref 1.4–7.0)
NEUTROS PCT: 66 %
PLATELETS: 217 10*3/uL (ref 150–379)
RBC: 4.91 x10E6/uL (ref 4.14–5.80)
RDW: 14.4 % (ref 12.3–15.4)
WBC: 6.3 10*3/uL (ref 3.4–10.8)

## 2016-08-04 LAB — LIPID PANEL WITH LDL/HDL RATIO
Cholesterol, Total: 222 mg/dL — ABNORMAL HIGH (ref 100–199)
HDL: 32 mg/dL — ABNORMAL LOW (ref >39)
LDL CALC: 152 mg/dL — AB (ref 0–99)
LDL/HDL RATIO: 4.8 ratio — AB (ref 0.0–3.6)
TRIGLYCERIDES: 191 mg/dL — AB (ref 0–149)
VLDL CHOLESTEROL CAL: 38 mg/dL (ref 5–40)

## 2016-08-04 LAB — TSH: TSH: 1.87 u[IU]/mL (ref 0.450–4.500)

## 2016-08-06 NOTE — Progress Notes (Signed)
Advised  ED 

## 2016-08-10 ENCOUNTER — Other Ambulatory Visit: Payer: Self-pay | Admitting: Family Medicine

## 2016-12-09 ENCOUNTER — Ambulatory Visit (INDEPENDENT_AMBULATORY_CARE_PROVIDER_SITE_OTHER): Payer: Medicare Other | Admitting: Family Medicine

## 2016-12-09 ENCOUNTER — Encounter: Payer: Self-pay | Admitting: Family Medicine

## 2016-12-09 VITALS — BP 150/94 | HR 73 | Temp 98.8°F | Resp 16 | Wt 225.2 lb

## 2016-12-09 DIAGNOSIS — R2232 Localized swelling, mass and lump, left upper limb: Secondary | ICD-10-CM

## 2016-12-09 NOTE — Progress Notes (Signed)
Subjective:     Patient ID: Henry [name], male   DOB: [birth year], 70 y.o.   MRN: [id number]  HPI  Chief Complaint  Patient presents with  . Arm Swelling    Patient comes in office today with concerns of swelling to his left underarm for the past 7 days. Patient denies any pain tenderness or redness.   States he accidentally discovered it while feeling under his arm. Denies pre-existing cyst.   Review of Systems     Objective:   Physical Exam  Constitutional: He appears well-developed and well-nourished. No distress.  Skin:  Left axilla with approx. 8 cm non-tender, mobile mass.       Assessment:    1. Axillary mass, left - Ambulatory referral to General Surgery    Plan:    F/u prn.

## 2016-12-09 NOTE — Patient Instructions (Signed)
We will call you with the surgical appointment.

## 2016-12-10 ENCOUNTER — Encounter: Payer: Self-pay | Admitting: General Surgery

## 2016-12-16 ENCOUNTER — Encounter: Payer: Self-pay | Admitting: General Surgery

## 2016-12-16 ENCOUNTER — Ambulatory Visit (INDEPENDENT_AMBULATORY_CARE_PROVIDER_SITE_OTHER): Payer: Medicare Other | Admitting: General Surgery

## 2016-12-16 ENCOUNTER — Other Ambulatory Visit (HOSPITAL_COMMUNITY)
Admission: RE | Admit: 2016-12-16 | Discharge: 2016-12-16 | Disposition: A | Discharge status: Home / Self Care | Payer: Medicare Other | Source: Ambulatory Visit | Attending: Anatomic Pathology & Clinical Pathology | Admitting: Anatomic Pathology & Clinical Pathology

## 2016-12-16 ENCOUNTER — Inpatient Hospital Stay: Payer: Self-pay

## 2016-12-16 VITALS — BP 124/70 | HR 74 | Resp 12 | Ht 73.0 in | Wt 222.0 lb

## 2016-12-16 DIAGNOSIS — R2232 Localized swelling, mass and lump, left upper limb: Secondary | ICD-10-CM | POA: Diagnosis not present

## 2016-12-16 NOTE — Progress Notes (Signed)
Patient ID: Henry [name], male   DOB: 1946/04/21, 71 y.o.   MRN: [id number]  Chief Complaint  Patient presents with  . Other    axillary mass    HPI Henry [name] is a 71 y.o. male here today for a evaluation of swelling and a ball under his left arm. Patient noticed this about two weeks ago. He denies any pain or redness.  He reports that the "ball" is gone but he still has some swelling in the area.  He reports that he does work in his garden daily.  HPI  Past Medical History:  Diagnosis Date  . Allergy   . Arthritis   . Cataract   . GERD (gastroesophageal reflux disease)   . Hyperlipidemia   . Hypertension     Past Surgical History:  Procedure Laterality Date  . CATARACT EXTRACTION    . MOLE REMOVAL    . NECK MASS EXCISION    . TONSILLECTOMY      Family History  Problem Relation Age of Onset  . Heart disease Mother   . Rheumatic fever Mother   . CAD Mother   . Heart attack Father   . COPD Sister     Social History Social History  Substance Use Topics  . Smoking status: Never Smoker  . Smokeless tobacco: Never Used  . Alcohol use 0.0 oz/week     Comment: 2 beers 4-5 times per week    Allergies  Allergen Reactions  . Lovastatin Other (See Comments)  . Pravastatin Nausea And Vomiting    joint ache  . Pravastatin Sodium     joint ache  . Simvastatin Other (See Comments)    muscle ache muscle ache  . Statins   . Sulfa Antibiotics Rash and Itching    Current Outpatient Prescriptions  Medication Sig Dispense Refill  . amLODipine (NORVASC) 5 MG tablet Take 1 tablet (5 mg total) by mouth daily. 90 tablet 3  . aspirin 81 MG tablet Take by mouth.    . cetirizine (ZYRTEC) 10 MG tablet Take 10 mg by mouth daily.    . cholecalciferol (VITAMIN D) 1000 UNITS tablet Take by mouth.    Marland Kitchen lisinopril (PRINIVIL,ZESTRIL) 40 MG tablet TAKE 1 TABLET (40 MG TOTAL) BY MOUTH DAILY. 90 tablet 2  . omeprazole (PRILOSEC) 20 MG capsule TAKE 1 CAPSULE (20 MG TOTAL) BY MOUTH  DAILY. 90 capsule 3  . sildenafil (REVATIO) 20 MG tablet UP TO 5 TABLETS DAILY AS NEEDED 50 tablet 11  . triamcinolone cream (KENALOG) 0.1 % Apply 1 application topically 2 (two) times daily. To affected area. Only use for a week at a time. 30 g 0   No current facility-administered medications for this visit.     Review of Systems Review of Systems  Constitutional: Negative.   Respiratory: Negative.   Cardiovascular: Negative.   Gastrointestinal: Negative.   Genitourinary: Negative.     Blood pressure 124/70, pulse 74, resp. rate 12, height 6\' 1"  (1.854 m), weight 222 lb (100.7 kg).  Physical Exam Physical Exam  Constitutional: He is oriented to person, place, and time. He appears well-developed and well-nourished.  Eyes: Conjunctivae are normal. No scleral icterus.  Neck: Neck supple.  Cardiovascular: Normal rate, regular rhythm and normal heart sounds.   Pulmonary/Chest: Effort normal and breath sounds normal.  Abdominal: Soft. Bowel sounds are normal. There is no tenderness. A hernia is present.    Lymphadenopathy:    He has no cervical adenopathy.  He has axillary adenopathy.       Right axillary: No pectoral and no lateral adenopathy present.       Left axillary: Lateral adenopathy present. No pectoral adenopathy present.      Right: No inguinal adenopathy present.       Left: No inguinal adenopathy present.  In the left axilla there is a 5 cm very firm smooth mobile mass.  Neurological: He is alert and oriented to person, place, and time.  Skin: Skin is warm and dry.   Ultrasound the left axilla was performed. There is a very large greater than 5 cm lobulated hypoechoic mass. No blood flow identified within the mass. This is consistent with abnormally enlarged suspicious lymph node or a group of lymph nodes matted lymph nodes.  Data Reviewed PCP notes  Assessment    Suspicious feeling and appearing large lymph node in the left axilla. Core biopsy is indicated.  Discussed fully with the patient and he is agreeable    Plan    core biopsy completed today. Await pathology report before deciding on further management     HPI, Physical Exam, Assessment and Plan have been scribed under the direction and in the presence of Mckinley Jewel, MD  Concepcion Living, LPN I have completed the exam and reviewed the above documentation for accuracy and completeness.  I agree with the above.  Haematologist has been used and any errors in dictation or transcription are unintentional.  Seeplaputhur G. Jamal Collin, M.D., F.A.C.S.   Junie Panning G 12/17/2016, 8:23 AM

## 2016-12-16 NOTE — Patient Instructions (Signed)
CARE AFTER BIOPSY  1. Leave the dressing on that your doctor applied after the biopsy. It is waterproof. You may bathe, shower and/or swim. The dressing can be removed in 3 days, you will see small strips of tape against your skin on the incision. Do not remove these strips they will gradually fall off in about 2-3 weeks. You may use an ice pack on and off for the first 12-24 hours for comfort.  2. You may want to use a gauze,cloth or similar protection to prevent rubbing against your dressing and incision. This is not necessary, but you may feel more comfortable doing so.  3. It is recommended that you wear a bra day and night to give support to the breast. This will prevent the weight of the breast from pulling on the incision.  4. The area may feel hard and lumpy under the incision. Do not be alarmed.  Softening of this tissue will occur in time.  5. You may have a follow up appointment or phone follow up in one week after your biopsy. The office phone number is [phone].  6. You will notice about a week or two after your office visit that the strips of the tape on your incision will begin to loosen. These may then be removed.  7. Report to your doctor any of the following:  * Severe pain not relieved by your pain medication  *Redness of the incision  * Drainage from the incision  *Fever greater than 101 degrees

## 2016-12-20 ENCOUNTER — Ambulatory Visit (INDEPENDENT_AMBULATORY_CARE_PROVIDER_SITE_OTHER): Payer: Medicare Other | Admitting: General Surgery

## 2016-12-20 ENCOUNTER — Encounter: Payer: Self-pay | Admitting: General Surgery

## 2016-12-20 VITALS — BP 140/84 | HR 82 | Resp 14 | Ht 73.0 in | Wt 219.0 lb

## 2016-12-20 DIAGNOSIS — C8334 Diffuse large B-cell lymphoma, lymph nodes of axilla and upper limb: Secondary | ICD-10-CM

## 2016-12-20 DIAGNOSIS — R2232 Localized swelling, mass and lump, left upper limb: Secondary | ICD-10-CM

## 2016-12-20 NOTE — Progress Notes (Signed)
Patient ID: Henry [name], male   DOB: 07/01/1945, 71 y.o.   MRN: [id number]  Chief Complaint  Patient presents with  . Follow-up    HPI Henry [name] is a 71 y.o. male here for discussion of pathology results following an axillary lymph node needle core biopsy. He reports no problems with the biopsy site.  HPI  Past Medical History:  Diagnosis Date  . Allergy   . Arthritis   . Cataract   . GERD (gastroesophageal reflux disease)   . Hyperlipidemia   . Hypertension     Past Surgical History:  Procedure Laterality Date  . CATARACT EXTRACTION    . MOLE REMOVAL    . NECK MASS EXCISION    . TONSILLECTOMY      Family History  Problem Relation Age of Onset  . Heart disease Mother   . Rheumatic fever Mother   . CAD Mother   . Heart attack Father   . COPD Sister     Social History Social History  Substance Use Topics  . Smoking status: Never Smoker  . Smokeless tobacco: Never Used  . Alcohol use 0.0 oz/week     Comment: 2 beers 4-5 times per week    Allergies  Allergen Reactions  . Lovastatin Other (See Comments)  . Pravastatin Nausea And Vomiting    joint ache  . Pravastatin Sodium     joint ache  . Simvastatin Other (See Comments)    muscle ache muscle ache  . Statins   . Sulfa Antibiotics Rash and Itching    Current Outpatient Prescriptions  Medication Sig Dispense Refill  . amLODipine (NORVASC) 5 MG tablet Take 1 tablet (5 mg total) by mouth daily. 90 tablet 3  . aspirin 81 MG tablet Take by mouth.    . cetirizine (ZYRTEC) 10 MG tablet Take 10 mg by mouth daily.    . cholecalciferol (VITAMIN D) 1000 UNITS tablet Take by mouth.    Marland Kitchen lisinopril (PRINIVIL,ZESTRIL) 40 MG tablet TAKE 1 TABLET (40 MG TOTAL) BY MOUTH DAILY. 90 tablet 2  . omeprazole (PRILOSEC) 20 MG capsule TAKE 1 CAPSULE (20 MG TOTAL) BY MOUTH DAILY. 90 capsule 3  . sildenafil (REVATIO) 20 MG tablet UP TO 5 TABLETS DAILY AS NEEDED 50 tablet 11  . triamcinolone cream (KENALOG) 0.1 % Apply 1  application topically 2 (two) times daily. To affected area. Only use for a week at a time. 30 g 0   No current facility-administered medications for this visit.     Review of Systems Review of Systems  Constitutional: Negative.   Respiratory: Negative.   Cardiovascular: Negative.     Blood pressure 140/84, pulse 82, resp. rate 14, height 6\' 1"  (1.854 m), weight 219 lb (99.3 kg).  Physical Exam Physical Exam  Constitutional: He is oriented to person, place, and time. He appears well-developed and well-nourished.  Neck: Neck supple.  Lymphadenopathy:    He has no cervical adenopathy.    He has axillary adenopathy (Axillary lymph node, no tenderness or evidence of regional inflammation).  Neurological: He is alert and oriented to person, place, and time.  Skin: Skin is warm and dry.  Psychiatric: He has a normal mood and affect.  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 Pt was agreeable.  Plan    Plan to schedule an appointment with the oncologist    HPI, Physical Exam, Assessment and Plan have been scribed under the direction and in the presence of Mckinley Jewel, MD.  Verlene Mayer, CMA  I have completed the exam and reviewed the above documentation for accuracy and completeness.  I agree with the above.  Haematologist has been used and any errors in dictation or transcription are unintentional.  Seeplaputhur G. Jamal Collin, M.D., F.A.C.S.  Junie Panning G 12/20/2016, 3:46 PM

## 2016-12-20 NOTE — Patient Instructions (Signed)
Plan to schedule appt with oncology

## 2016-12-21 ENCOUNTER — Telehealth: Payer: Self-pay | Admitting: *Deleted

## 2016-12-21 ENCOUNTER — Telehealth: Payer: Self-pay

## 2016-12-21 ENCOUNTER — Other Ambulatory Visit: Payer: Self-pay

## 2016-12-21 DIAGNOSIS — C8334 Diffuse large B-cell lymphoma, lymph nodes of axilla and upper limb: Secondary | ICD-10-CM

## 2016-12-21 NOTE — Telephone Encounter (Signed)
Patient has an appointment with Dr. Rogue Bussing 12/22/16 at 1:30pm. Patient was instructed of appointment and aware of time and date. Patient aware to call back with any questions or concerns.

## 2016-12-21 NOTE — Telephone Encounter (Signed)
Message left for patient to call for information on his scheduled PET scan.

## 2016-12-21 NOTE — Telephone Encounter (Signed)
Notified patient of PET scan scheduled for 12/23/16 at 12:30 pm. He will arrive at 12:00 pm and have noting to eat or drink after midnight. The patient is aware of date, time, and instructions.

## 2016-12-21 NOTE — Telephone Encounter (Signed)
The patient is scheduled for surgery at [facility] on 12/29/16. He will pre admit by phone. The patient is aware of date and instructions.

## 2016-12-22 ENCOUNTER — Other Ambulatory Visit: Payer: Self-pay | Admitting: General Surgery

## 2016-12-22 ENCOUNTER — Inpatient Hospital Stay: Payer: Medicare Other | Attending: Internal Medicine | Admitting: Internal Medicine

## 2016-12-22 ENCOUNTER — Inpatient Hospital Stay: Payer: Medicare Other

## 2016-12-22 DIAGNOSIS — Z5111 Encounter for antineoplastic chemotherapy: Secondary | ICD-10-CM | POA: Diagnosis not present

## 2016-12-22 DIAGNOSIS — I129 Hypertensive chronic kidney disease with stage 1 through stage 4 chronic kidney disease, or unspecified chronic kidney disease: Secondary | ICD-10-CM | POA: Diagnosis not present

## 2016-12-22 DIAGNOSIS — E785 Hyperlipidemia, unspecified: Secondary | ICD-10-CM | POA: Diagnosis not present

## 2016-12-22 DIAGNOSIS — C8334 Diffuse large B-cell lymphoma, lymph nodes of axilla and upper limb: Secondary | ICD-10-CM | POA: Insufficient documentation

## 2016-12-22 DIAGNOSIS — M1990 Unspecified osteoarthritis, unspecified site: Secondary | ICD-10-CM | POA: Insufficient documentation

## 2016-12-22 DIAGNOSIS — N181 Chronic kidney disease, stage 1: Secondary | ICD-10-CM | POA: Insufficient documentation

## 2016-12-22 DIAGNOSIS — Z7952 Long term (current) use of systemic steroids: Secondary | ICD-10-CM | POA: Insufficient documentation

## 2016-12-22 DIAGNOSIS — Z79899 Other long term (current) drug therapy: Secondary | ICD-10-CM

## 2016-12-22 DIAGNOSIS — I10 Essential (primary) hypertension: Secondary | ICD-10-CM | POA: Diagnosis not present

## 2016-12-22 DIAGNOSIS — Z7689 Persons encountering health services in other specified circumstances: Secondary | ICD-10-CM | POA: Insufficient documentation

## 2016-12-22 DIAGNOSIS — Z7982 Long term (current) use of aspirin: Secondary | ICD-10-CM | POA: Diagnosis not present

## 2016-12-22 DIAGNOSIS — K219 Gastro-esophageal reflux disease without esophagitis: Secondary | ICD-10-CM | POA: Diagnosis not present

## 2016-12-22 NOTE — Progress Notes (Signed)
[place] NOTE  Patient Care Team: Jerrol Banana., MD as PCP - General (Family Medicine) Carmon Ginsberg, [place] as Referring Physician (Family Medicine) Christene Lye, MD (General Surgery)  CHIEF COMPLAINTS/PURPOSE OF CONSULTATION:  Lymphoma  #  Oncology History   # AUG 6th 2018- DLBCL [LEFT axilla- Core Bx- Dr.Sankar; CD-20, bcl-2;bcl-6 positive]; Ki- 67 >90%.      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  male referred to [place] for further evaluation and recommendations for new diagnosis of diffuse large B-cell lymphoma.  Patient states that he noted to have a lump under his left underarm approximately 3 weeks ago. This is promptly evaluated by PCP followed by referral to Dr. Jamal Collin for further evaluation. Patient states the lump has not gotten any worse; maybe slightly smaller over the last 3 weeks or so.  Patient denies any pain. Denies any night sweats. He denies any lumps or bumps anywhere else. No nausea no vomiting. No headaches. No night sweats no fevers or chills. Patient denies any history of tingling and numbness in feet.    ROS: A complete 10 point review of system is done which is negative except mentioned above in history of present illness  MEDICAL HISTORY:  Past Medical History:  Diagnosis Date  . Allergy   . Arthritis   . Cataract   . GERD (gastroesophageal reflux disease)   . Hyperlipidemia   . Hypertension     SURGICAL HISTORY: Past Surgical History:  Procedure Laterality Date  . CATARACT EXTRACTION    . MOLE REMOVAL    . NECK MASS EXCISION    . TONSILLECTOMY      SOCIAL HISTORY:lives in [place], never smoked; 2 beers/ day. 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  Social History   Social History  . Marital status: Married    Spouse name: N/A  . Number of children: N/A  . Years of education: N/A   Occupational  History  . Not on file.   Social History Main Topics  . Smoking status: Never Smoker  . Smokeless tobacco: Never Used  . Alcohol use 0.0 oz/week     Comment: 2 beers 4-5 times per week  . Drug use: No  . Sexual activity: Not on file   Other Topics Concern  . Not on file   Social History Narrative  . No narrative on file    FAMILY HISTORY: Family History  Problem Relation Age of Onset  . Heart disease Mother   . Rheumatic fever Mother   . CAD Mother   . Heart attack Father   . COPD Sister     ALLERGIES:  is allergic to lovastatin; pravastatin; pravastatin sodium; simvastatin; statins; and sulfa antibiotics.  MEDICATIONS:  Current Outpatient Prescriptions  Medication Sig Dispense Refill  . amLODipine (NORVASC) 5 MG tablet Take 1 tablet (5 mg total) by mouth daily. 90 tablet 3  . aspirin 81 MG tablet Take by mouth.    . cetirizine (ZYRTEC) 10 MG tablet Take 10 mg by mouth daily.    . cholecalciferol (VITAMIN D) 1000 UNITS tablet Take by mouth.    Marland Kitchen lisinopril (PRINIVIL,ZESTRIL) 40 MG tablet TAKE 1 TABLET (40 MG TOTAL) BY MOUTH DAILY. 90 tablet 2  . omeprazole (PRILOSEC) 20 MG capsule TAKE 1 CAPSULE (20 MG TOTAL) BY MOUTH DAILY. 90 capsule 3  . triamcinolone cream (KENALOG) 0.1 %  Apply 1 application topically 2 (two) times daily. To affected area. Only use for a week at a time. 30 g 0  . sildenafil (REVATIO) 20 MG tablet UP TO 5 TABLETS DAILY AS NEEDED 50 tablet 11   No current facility-administered medications for this visit.       Marland Kitchen  PHYSICAL EXAMINATION: ECOG PERFORMANCE STATUS: 1 - Symptomatic but completely ambulatory  Vitals:   12/22/16 1337  BP: 125/84  Pulse: 76  Resp: 16  Temp: (!) 97.1 F (36.2 C)   Filed Weights   12/22/16 1337  Weight: 217 lb 12.8 oz (98.8 kg)    GENERAL: Well-nourished well-developed; Alert, no distress and comfortable.   Accompanied by his wife. EYES: no pallor or icterus OROPHARYNX: no thrush or ulceration; good dentition   NECK: supple, no masses felt LYMPH:  no palpable lymphadenopathy in the cervical,  or inguinal regions. Soft vague/palpable mass noted in the left axilla ~3-4 cm in size LUNGS: clear to auscultation and  No wheeze or crackles HEART/CVS: regular rate & rhythm and no murmurs; No lower extremity edema ABDOMEN: abdomen soft, non-tender and normal bowel sounds Musculoskeletal:no cyanosis of digits and no clubbing  PSYCH: alert & oriented x 3 with fluent speech NEURO: no focal motor/sensory deficits SKIN:  no rashes or significant lesions  LABORATORY DATA:  I have reviewed the data as listed Lab Results  Component Value Date   WBC 6.3 08/03/2016   HGB 15.0 08/03/2016   HCT 44.4 08/03/2016   MCV 90 08/03/2016   PLT 217 08/03/2016    Recent Labs  08/03/16 1117  NA 143  K 4.6  CL 104  CO2 27  GLUCOSE 92  BUN 14  CREATININE 1.31*  CALCIUM 9.7  GFRNONAA 56*  GFRAA 64  PROT 6.9  ALBUMIN 4.2  AST 17  ALT 12  ALKPHOS 74  BILITOT 0.5    RADIOGRAPHIC STUDIES: I have personally reviewed the radiological images as listed and agreed with the findings in the report. [place] Limited Joint Space Structures Up Left  Result Date: 12/17/2016 Ultrasound evaluation of the left axilla was performed to assess a palpable large mass. There is a lobulated large   hypoechoic mass measuring in excess of 5 cm in maximal length. No other large lymph nodes identified. The dominant mass is likely a group of lymph nodes matted and enlarged. No blood flow was identified within the mass. Core biopsy of this mass was recommended to the patient. He was agreeable and procedure completed. With the ultrasound guidance 10 mL of 0.5% Marcaine mixed with 1% Xylocaine was instilled along the planned core biopsy tract. Area was prepped with the ChloraPrep, sterile towels were placed. Tiny stab incision was made. A 14-gauge Bard core biopsy device was then employed with ultrasound guidance obtaining 6 samples. Specimen was  sent to pathology. The stab incision was closed with Steri-Strips and tincture benzoin. Telfa and Tegaderm dressings placed. No immediate problems encountered from the procedure.    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 Melina Copa- recommend further workup for ABC versus GCB. Also recommend checking for [facility] IHC; also [facility] for myc/BCL-2/ bcl-6 gene rearrangements. As per the discussion-not suggestive of mantle cell lymphoma.   # Recommend a PET scan; is up on the PET scan would recommend a bone marrow biopsy for further evaluation. PET scan planned tomorrow. If early stage recommend chemotherapy followed by radiation; if  advanced recommend chemotherapy alone. Discussed that the code of treatments would be cured.  # Discussed the usual treatment for diffuse large B-cell lymphoma [pending final confirmation of pathology]- R CHOP chemotherapy every 3 weeks; on pro. I discussed the individual drugs- and also the adverse events.  Discussed the potential side effects including but not limited to-increasing fatigue, nausea vomiting, diarrhea, hair loss, sores in the mouth, increase risk of infection and also neuropathy.   # Chemotherapy education. Also recommend port placement for chemotherapy. Check labs CBC CMP and LDH hepatitis panel.  # follow up on aug 17th/ MD; R-CHOP/on pro on 20th. The above plan of care was discussed with Dr. Jamal Collin.   Thank you Dr.Sankar for allowing me to participate in the care of your pleasant patient. Please do not hesitate to contact me with questions or concerns in the interim.  All questions were answered. The patient knows to call the clinic with any problems, questions or concerns.    Cammie Sickle, MD 12/23/2016 8:51 AM

## 2016-12-22 NOTE — Assessment & Plan Note (Addendum)
Diffuse large B-cell lymphoma- discussed with pathologist Dr. Melina Copa- recommend further workup for ABC versus GCB. Also recommend checking for [facility] IHC; also [place] for myc/BCL-2/ bcl-6 gene rearrangements. As per the discussion-not suggestive of mantle cell lymphoma.   # Recommend a PET scan; is up on the PET scan would recommend a bone marrow biopsy for further evaluation. PET scan planned tomorrow. If early stage recommend chemotherapy followed by radiation; if advanced recommend chemotherapy alone. Discussed that the code of treatments would be cured.  # Discussed the usual treatment for diffuse large B-cell lymphoma [pending final confirmation of pathology]- R CHOP chemotherapy every 3 weeks; on pro. I discussed the individual drugs- and also the adverse events.  Discussed the potential side effects including but not limited to-increasing fatigue, nausea vomiting, diarrhea, hair loss, sores in the mouth, increase risk of infection and also neuropathy.   # Chemotherapy education. Also recommend port placement for chemotherapy. Check labs CBC CMP and LDH hepatitis panel.  # follow up on aug 17th/ MD; R-CHOP/on pro on 20th. The above plan of care was discussed with Dr. Jamal Collin.   Thank you Dr.Sankar for allowing me to participate in the care of your pleasant patient. Please do not hesitate to contact me with questions or concerns in the interim.

## 2016-12-22 NOTE — Progress Notes (Signed)
Patient is here as a new patient  

## 2016-12-23 ENCOUNTER — Ambulatory Visit
Admission: RE | Admit: 2016-12-23 | Discharge: 2016-12-23 | Disposition: A | Discharge status: Home / Self Care | Payer: Medicare Other | Source: Ambulatory Visit | Attending: General Surgery | Admitting: General Surgery

## 2016-12-23 ENCOUNTER — Telehealth: Payer: Self-pay | Admitting: *Deleted

## 2016-12-23 ENCOUNTER — Inpatient Hospital Stay: Payer: Medicare Other

## 2016-12-23 DIAGNOSIS — C8334 Diffuse large B-cell lymphoma, lymph nodes of axilla and upper limb: Secondary | ICD-10-CM | POA: Insufficient documentation

## 2016-12-23 LAB — GLUCOSE, CAPILLARY: GLUCOSE-CAPILLARY: 98 mg/dL (ref 65–99)

## 2016-12-23 MED ORDER — FLUDEOXYGLUCOSE F - 18 (FDG) INJECTION
12.0000 | Freq: Once | INTRAVENOUS | Status: AC | PRN
  Administered 2016-12-23: 12.73 via INTRAVENOUS

## 2016-12-23 NOTE — Telephone Encounter (Signed)
msg from Dr. Rogue Bussing - Pt- Henry [name], [place]- 2045/08/22; will need lab appt tomorrow; that could be coordinated with MUGA scan which is being done tomorrow.  Please inform pt.  Attempted to reach patient. No answer. Left vm requesting patient to return my phone call.- Also sent mychart msg to patient. msg sent to scheduling to arrange for lab apt.

## 2016-12-23 NOTE — Patient Instructions (Signed)
Rituximab injection What is this medicine? RITUXIMAB (ri TUX i mab) is a monoclonal antibody. It is used to treat certain types of cancer like non-Hodgkin lymphoma and chronic lymphocytic leukemia. It is also used to treat rheumatoid arthritis, granulomatosis with polyangiitis (or Wegener's granulomatosis), and microscopic polyangiitis. This medicine may be used for other purposes; ask your health care provider or pharmacist if you have questions. COMMON BRAND NAME(S): Rituxan What should I tell my health care provider before I take this medicine? They need to know if you have any of these conditions: -heart disease -infection (especially a virus infection such as hepatitis B, chickenpox, cold sores, or herpes) -immune system problems -irregular heartbeat -kidney disease -lung or breathing disease, like asthma -recently received or scheduled to receive a vaccine -an unusual or allergic reaction to rituximab, mouse proteins, other medicines, foods, dyes, or preservatives -pregnant or trying to get pregnant -breast-feeding How should I use this medicine? This medicine is for infusion into a vein. It is administered in a hospital or clinic by a specially trained health care professional. A special MedGuide will be given to you by the pharmacist with each prescription and refill. Be sure to read this information carefully each time. Talk to your pediatrician regarding the use of this medicine in children. This medicine is not approved for use in children. Overdosage: If you think you have taken too much of this medicine contact a poison control center or emergency room at once. NOTE: This medicine is only for you. Do not share this medicine with others. What if I miss a dose? It is important not to miss a dose. Call your doctor or health care professional if you are unable to keep an appointment. What may interact with this medicine? -cisplatin -other medicines for arthritis like disease  modifying antirheumatic drugs or tumor necrosis factor inhibitors -live virus vaccines This list may not describe all possible interactions. Give your health care provider a list of all the medicines, herbs, non-prescription drugs, or dietary supplements you use. Also tell them if you smoke, drink alcohol, or use illegal drugs. Some items may interact with your medicine. What should I watch for while using this medicine? Your condition will be monitored carefully while you are receiving this medicine. You may need blood work done while you are taking this medicine. This medicine can cause serious allergic reactions. To reduce your risk you may need to take medicine before treatment with this medicine. Take your medicine as directed. In some patients, this medicine may cause a serious brain infection that may cause death. If you have any problems seeing, thinking, speaking, walking, or standing, tell your doctor right away. If you cannot reach your doctor, urgently seek other source of medical care. Call your doctor or health care professional for advice if you get a fever, chills or sore throat, or other symptoms of a cold or flu. Do not treat yourself. This drug decreases your body's ability to fight infections. Try to avoid being around people who are sick. Do not become pregnant while taking this medicine or for 12 months after stopping it. Women should inform their doctor if they wish to become pregnant or think they might be pregnant. There is a potential for serious side effects to an unborn child. Talk to your health care professional or pharmacist for more information. What side effects may I notice from receiving this medicine? Side effects that you should report to your doctor or health care professional as soon as possible: -breathing   problems -chest pain -dizziness or feeling faint -fast, irregular heartbeat -low blood counts - this medicine may decrease the number of white blood cells,  red blood cells and platelets. You may be at increased risk for infections and bleeding. -mouth sores -redness, blistering, peeling or loosening of the skin, including inside the mouth (this can be added for any serious or exfoliative rash that could lead to hospitalization) -signs of infection - fever or chills, cough, sore throat, pain or difficulty passing urine -signs and symptoms of kidney injury like trouble passing urine or change in the amount of urine -signs and symptoms of liver injury like dark yellow or brown urine; general ill feeling or flu-like symptoms; light-colored stools; loss of appetite; nausea; right upper belly pain; unusually weak or tired; yellowing of the eyes or skin -stomach pain -vomiting Side effects that usually do not require medical attention (report to your doctor or health care professional if they continue or are bothersome): -headache -joint pain -muscle cramps or muscle pain This list may not describe all possible side effects. Call your doctor for medical advice about side effects. You may report side effects to FDA at 1-800-FDA-1088. Where should I keep my medicine? This drug is given in a hospital or clinic and will not be stored at home. NOTE: This sheet is a summary. It may not cover all possible information. If you have questions about this medicine, talk to your doctor, pharmacist, or health care provider.  2018 Elsevier/Gold Standard (2015-12-10 15:28:09) Doxorubicin injection What is this medicine? DOXORUBICIN (dox oh ROO bi sin) is a chemotherapy drug. It is used to treat many kinds of cancer like leukemia, lymphoma, neuroblastoma, sarcoma, and Wilms' tumor. It is also used to treat bladder cancer, breast cancer, lung cancer, ovarian cancer, stomach cancer, and thyroid cancer. This medicine may be used for other purposes; ask your health care provider or pharmacist if you have questions. COMMON BRAND NAME(S): Adriamycin, Adriamycin PFS, Adriamycin  RDF, Rubex What should I tell my health care provider before I take this medicine? They need to know if you have any of these conditions: -heart disease -history of low blood counts caused by a medicine -liver disease -recent or ongoing radiation therapy -an unusual or allergic reaction to doxorubicin, other chemotherapy agents, other medicines, foods, dyes, or preservatives -pregnant or trying to get pregnant -breast-feeding How should I use this medicine? This drug is given as an infusion into a vein. It is administered in a hospital or clinic by a specially trained health care professional. If you have pain, swelling, burning or any unusual feeling around the site of your injection, tell your health care professional right away. Talk to your pediatrician regarding the use of this medicine in children. Special care may be needed. Overdosage: If you think you have taken too much of this medicine contact a poison control center or emergency room at once. NOTE: This medicine is only for you. Do not share this medicine with others. What if I miss a dose? It is important not to miss your dose. Call your doctor or health care professional if you are unable to keep an appointment. What may interact with this medicine? This medicine may interact with the following medications: -6-mercaptopurine -paclitaxel -phenytoin -St. John's Wort -trastuzumab -verapamil This list may not describe all possible interactions. Give your health care provider a list of all the medicines, herbs, non-prescription drugs, or dietary supplements you use. Also tell them if you smoke, drink alcohol, or use illegal drugs.   Some items may interact with your medicine. What should I watch for while using this medicine? This drug may make you feel generally unwell. This is not uncommon, as chemotherapy can affect healthy cells as well as cancer cells. Report any side effects. Continue your course of treatment even though you  feel ill unless your doctor tells you to stop. There is a maximum amount of this medicine you should receive throughout your life. The amount depends on the medical condition being treated and your overall health. Your doctor will watch how much of this medicine you receive in your lifetime. Tell your doctor if you have taken this medicine before. You may need blood work done while you are taking this medicine. Your urine may turn red for a few days after your dose. This is not blood. If your urine is dark or brown, call your doctor. In some cases, you may be given additional medicines to help with side effects. Follow all directions for their use. Call your doctor or health care professional for advice if you get a fever, chills or sore throat, or other symptoms of a cold or flu. Do not treat yourself. This drug decreases your body's ability to fight infections. Try to avoid being around people who are sick. This medicine may increase your risk to bruise or bleed. Call your doctor or health care professional if you notice any unusual bleeding. Talk to your doctor about your risk of cancer. You may be more at risk for certain types of cancers if you take this medicine. Do not become pregnant while taking this medicine or for 6 months after stopping it. Women should inform their doctor if they wish to become pregnant or think they might be pregnant. Men should not father a child while taking this medicine and for 6 months after stopping it. There is a potential for serious side effects to an unborn child. Talk to your health care professional or pharmacist for more information. Do not breast-feed an infant while taking this medicine. This medicine has caused ovarian failure in some women and reduced sperm counts in some men This medicine may interfere with the ability to have a child. Talk with your doctor or health care professional if you are concerned about your fertility. What side effects may I notice  from receiving this medicine? Side effects that you should report to your doctor or health care professional as soon as possible: -allergic reactions like skin rash, itching or hives, swelling of the face, lips, or tongue -breathing problems -chest pain -fast or irregular heartbeat -low blood counts - this medicine may decrease the number of white blood cells, red blood cells and platelets. You may be at increased risk for infections and bleeding. -pain, redness, or irritation at site where injected -signs of infection - fever or chills, cough, sore throat, pain or difficulty passing urine -signs of decreased platelets or bleeding - bruising, pinpoint red spots on the skin, black, tarry stools, blood in the urine -swelling of the ankles, feet, hands -tiredness -weakness Side effects that usually do not require medical attention (report to your doctor or health care professional if they continue or are bothersome): -diarrhea -hair loss -mouth sores -nail discoloration or damage -nausea -red colored urine -vomiting This list may not describe all possible side effects. Call your doctor for medical advice about side effects. You may report side effects to FDA at 1-800-FDA-1088. Where should I keep my medicine? This drug is given in a hospital or clinic   and will not be stored at home. NOTE: This sheet is a summary. It may not cover all possible information. If you have questions about this medicine, talk to your doctor, pharmacist, or health care provider.  2018 Elsevier/Gold Standard (2015-06-30 11:28:51) Vincristine injection What is this medicine? VINCRISTINE (vin KRIS teen) is a chemotherapy drug. It slows the growth of cancer cells. This medicine is used to treat many types of cancer like Hodgkin's disease, leukemia, non-Hodgkin's lymphoma, neuroblastoma (brain cancer), rhabdomyosarcoma, and Wilms' tumor. This medicine may be used for other purposes; ask your health care provider or  pharmacist if you have questions. COMMON BRAND NAME(S): Oncovin, Vincasar PFS What should I tell my health care provider before I take this medicine? They need to know if you have any of these conditions: -blood disorders -gout -infection (especially chickenpox, cold sores, or herpes) -kidney disease -liver disease -lung disease -nervous system disease like Charcot-Marie-Tooth (CMT) -recent or ongoing radiation therapy -an unusual or allergic reaction to vincristine, other chemotherapy agents, other medicines, foods, dyes, or preservatives -pregnant or trying to get pregnant -breast-feeding How should I use this medicine? This drug is given as an infusion into a vein. It is administered in a hospital or clinic by a specially trained health care professional. If you have pain, swelling, burning, or any unusual feeling around the site of your injection, tell your health care professional right away. Talk to your pediatrician regarding the use of this medicine in children. While this drug may be prescribed for selected conditions, precautions do apply. Overdosage: If you think you have taken too much of this medicine contact a poison control center or emergency room at once. NOTE: This medicine is only for you. Do not share this medicine with others. What if I miss a dose? It is important not to miss your dose. Call your doctor or health care professional if you are unable to keep an appointment. What may interact with this medicine? Do not take this medicine with any of the following medications: -itraconazole -mibefradil -voriconazole This medicine may also interact with the following medications: -cyclosporine -erythromycin -fluconazole -ketoconazole -medicines for HIV like delavirdine, efavirenz, nevirapine -medicines for seizures like ethotoin, fosphenotoin, phenytoin -medicines to increase blood counts like filgrastim, pegfilgrastim, sargramostim -other chemotherapy drugs like  cisplatin, L-asparaginase, methotrexate, mitomycin, paclitaxel -pegaspargase -vaccines -zalcitabine, ddC Talk to your doctor or health care professional before taking any of these medicines: -acetaminophen -aspirin -ibuprofen -ketoprofen -naproxen This list may not describe all possible interactions. Give your health care provider a list of all the medicines, herbs, non-prescription drugs, or dietary supplements you use. Also tell them if you smoke, drink alcohol, or use illegal drugs. Some items may interact with your medicine. What should I watch for while using this medicine? Your condition will be monitored carefully while you are receiving this medicine. You will need important blood work done while you are taking this medicine. This drug may make you feel generally unwell. This is not uncommon, as chemotherapy can affect healthy cells as well as cancer cells. Report any side effects. Continue your course of treatment even though you feel ill unless your doctor tells you to stop. In some cases, you may be given additional medicines to help with side effects. Follow all directions for their use. Call your doctor or health care professional for advice if you get a fever, chills or sore throat, or other symptoms of a cold or flu. Do not treat yourself. Avoid taking products that contain aspirin, acetaminophen, ibuprofen,   naproxen, or ketoprofen unless instructed by your doctor. These medicines may hide a fever. Do not become pregnant while taking this medicine. Women should inform their doctor if they wish to become pregnant or think they might be pregnant. There is a potential for serious side effects to an unborn child. Talk to your health care professional or pharmacist for more information. Do not breast-feed an infant while taking this medicine. Men may have a lower sperm count while taking this medicine. Talk to your doctor if you plan to father a child. What side effects may I notice from  receiving this medicine? Side effects that you should report to your doctor or health care professional as soon as possible: -allergic reactions like skin rash, itching or hives, swelling of the face, lips, or tongue -breathing problems -confusion or changes in emotions or moods -constipation -cough -mouth sores -muscle weakness -nausea and vomiting -pain, swelling, redness or irritation at the injection site -pain, tingling, numbness in the hands or feet -problems with balance, talking, walking -seizures -stomach pain -trouble passing urine or change in the amount of urine Side effects that usually do not require medical attention (report to your doctor or health care professional if they continue or are bothersome): -diarrhea -hair loss -jaw pain -loss of appetite This list may not describe all possible side effects. Call your doctor for medical advice about side effects. You may report side effects to FDA at 1-800-FDA-1088. Where should I keep my medicine? This drug is given in a hospital or clinic and will not be stored at home. NOTE: This sheet is a summary. It may not cover all possible information. If you have questions about this medicine, talk to your doctor, pharmacist, or health care provider.  2018 Elsevier/Gold Standard (2008-01-29 17:17:13) Cyclophosphamide injection What is this medicine? CYCLOPHOSPHAMIDE (sye kloe FOSS fa mide) is a chemotherapy drug. It slows the growth of cancer cells. This medicine is used to treat many types of cancer like lymphoma, myeloma, leukemia, breast cancer, and ovarian cancer, to name a few. This medicine may be used for other purposes; ask your health care provider or pharmacist if you have questions. COMMON BRAND NAME(S): Cytoxan, Neosar What should I tell my health care provider before I take this medicine? They need to know if you have any of these conditions: -blood disorders -history of other chemotherapy -infection -kidney  disease -liver disease -recent or ongoing radiation therapy -tumors in the bone marrow -an unusual or allergic reaction to cyclophosphamide, other chemotherapy, other medicines, foods, dyes, or preservatives -pregnant or trying to get pregnant -breast-feeding How should I use this medicine? This drug is usually given as an injection into a vein or muscle or by infusion into a vein. It is administered in a hospital or clinic by a specially trained health care professional. Talk to your pediatrician regarding the use of this medicine in children. Special care may be needed. Overdosage: If you think you have taken too much of this medicine contact a poison control center or emergency room at once. NOTE: This medicine is only for you. Do not share this medicine with others. What if I miss a dose? It is important not to miss your dose. Call your doctor or health care professional if you are unable to keep an appointment. What may interact with this medicine? This medicine may interact with the following medications: -amiodarone -amphotericin B -azathioprine -certain antiviral medicines for HIV or AIDS such as protease inhibitors (e.g., indinavir, ritonavir) and zidovudine -certain blood   pressure medications such as benazepril, captopril, enalapril, fosinopril, lisinopril, moexipril, monopril, perindopril, quinapril, ramipril, trandolapril -certain cancer medications such as anthracyclines (e.g., daunorubicin, doxorubicin), busulfan, cytarabine, paclitaxel, pentostatin, tamoxifen, trastuzumab -certain diuretics such as chlorothiazide, chlorthalidone, hydrochlorothiazide, indapamide, metolazone -certain medicines that treat or prevent blood clots like warfarin -certain muscle relaxants such as succinylcholine -cyclosporine -etanercept -indomethacin -medicines to increase blood counts like filgrastim, pegfilgrastim, sargramostim -medicines used as general  anesthesia -metronidazole -natalizumab This list may not describe all possible interactions. Give your health care provider a list of all the medicines, herbs, non-prescription drugs, or dietary supplements you use. Also tell them if you smoke, drink alcohol, or use illegal drugs. Some items may interact with your medicine. What should I watch for while using this medicine? Visit your doctor for checks on your progress. This drug may make you feel generally unwell. This is not uncommon, as chemotherapy can affect healthy cells as well as cancer cells. Report any side effects. Continue your course of treatment even though you feel ill unless your doctor tells you to stop. Drink water or other fluids as directed. Urinate often, even at night. In some cases, you may be given additional medicines to help with side effects. Follow all directions for their use. Call your doctor or health care professional for advice if you get a fever, chills or sore throat, or other symptoms of a cold or flu. Do not treat yourself. This drug decreases your body's ability to fight infections. Try to avoid being around people who are sick. This medicine may increase your risk to bruise or bleed. Call your doctor or health care professional if you notice any unusual bleeding. Be careful brushing and flossing your teeth or using a toothpick because you may get an infection or bleed more easily. If you have any dental work done, tell your dentist you are receiving this medicine. You may get drowsy or dizzy. Do not drive, use machinery, or do anything that needs mental alertness until you know how this medicine affects you. Do not become pregnant while taking this medicine or for 1 year after stopping it. Women should inform their doctor if they wish to become pregnant or think they might be pregnant. Men should not father a child while taking this medicine and for 4 months after stopping it. There is a potential for serious side  effects to an unborn child. Talk to your health care professional or pharmacist for more information. Do not breast-feed an infant while taking this medicine. This medicine may interfere with the ability to have a child. This medicine has caused ovarian failure in some women. This medicine has caused reduced sperm counts in some men. You should talk with your doctor or health care professional if you are concerned about your fertility. If you are going to have surgery, tell your doctor or health care professional that you have taken this medicine. What side effects may I notice from receiving this medicine? Side effects that you should report to your doctor or health care professional as soon as possible: -allergic reactions like skin rash, itching or hives, swelling of the face, lips, or tongue -low blood counts - this medicine may decrease the number of white blood cells, red blood cells and platelets. You may be at increased risk for infections and bleeding. -signs of infection - fever or chills, cough, sore throat, pain or difficulty passing urine -signs of decreased platelets or bleeding - bruising, pinpoint red spots on the skin, black, tarry stools, blood   in the urine -signs of decreased red blood cells - unusually weak or tired, fainting spells, lightheadedness -breathing problems -dark urine -dizziness -palpitations -swelling of the ankles, feet, hands -trouble passing urine or change in the amount of urine -weight gain -yellowing of the eyes or skin Side effects that usually do not require medical attention (report to your doctor or health care professional if they continue or are bothersome): -changes in nail or skin color -hair loss -missed menstrual periods -mouth sores -nausea, vomiting This list may not describe all possible side effects. Call your doctor for medical advice about side effects. You may report side effects to FDA at 1-800-FDA-1088. Where should I keep my  medicine? This drug is given in a hospital or clinic and will not be stored at home. NOTE: This sheet is a summary. It may not cover all possible information. If you have questions about this medicine, talk to your doctor, pharmacist, or health care provider.  2018 [employer]/Gold Standard (2012-03-17 16:22:58)  

## 2016-12-24 ENCOUNTER — Telehealth: Payer: Self-pay | Admitting: *Deleted

## 2016-12-24 ENCOUNTER — Other Ambulatory Visit: Payer: Self-pay

## 2016-12-24 ENCOUNTER — Ambulatory Visit: Payer: Medicare Other

## 2016-12-24 ENCOUNTER — Other Ambulatory Visit: Payer: Self-pay | Admitting: *Deleted

## 2016-12-24 DIAGNOSIS — T451X5A Adverse effect of antineoplastic and immunosuppressive drugs, initial encounter: Secondary | ICD-10-CM

## 2016-12-24 DIAGNOSIS — Z95828 Presence of other vascular implants and grafts: Secondary | ICD-10-CM

## 2016-12-24 DIAGNOSIS — R112 Nausea with vomiting, unspecified: Secondary | ICD-10-CM

## 2016-12-24 DIAGNOSIS — C8334 Diffuse large B-cell lymphoma, lymph nodes of axilla and upper limb: Secondary | ICD-10-CM

## 2016-12-24 MED ORDER — PROCHLORPERAZINE MALEATE 10 MG PO TABS: 10.0000 mg | ORAL_TABLET | Freq: Four times a day (QID) | ORAL | 3 refills | Status: DC | PRN

## 2016-12-24 MED ORDER — LIDOCAINE-PRILOCAINE 2.5-2.5 % EX CREA: 1.0000 "application " | TOPICAL_CREAM | CUTANEOUS | 3 refills | Status: DC | PRN

## 2016-12-24 MED ORDER — ONDANSETRON HCL 8 MG PO TABS: 8.0000 mg | ORAL_TABLET | Freq: Three times a day (TID) | ORAL | 3 refills | Status: DC | PRN

## 2016-12-24 NOTE — Telephone Encounter (Signed)
Returned phone call to daughter. Discussed goals of care, purpose of upcoming bone marrow biopsy and pet scan results with daughter. Pet di Also discussed follow-up questions from chemotherapy education class. Pt needed antiemetics and emla cream sent to pharmacy. Discussed constipation and diarrhea interventions. Daughter will pick up stools softeners and laxative and imodium for patient to have on hand. Also discussed the importance of the patient's meticulous mouth care while on chemotherapy and avoiding alcohol based mouth washes.

## 2016-12-24 NOTE — Telephone Encounter (Signed)
Henry [name], Daughter left msg with RN - would like Dr. B and his team to contact her today as well with the plan of care. [phone]

## 2016-12-24 NOTE — Telephone Encounter (Signed)
Spoke with [id number] reiterated the same goals of care/ plan of care / appointments with the patient as previously discussed with daughter. Discussed pet scan results. Discussed prescriptions that were sent to pharmacy and otc constipation/diarrhea interventions. Discussed oral care during chemotherapy. Pt gave verbal understanding.

## 2016-12-27 ENCOUNTER — Inpatient Hospital Stay: Admission: RE | Admit: 2016-12-27 | Payer: Self-pay | Source: Ambulatory Visit

## 2016-12-27 ENCOUNTER — Encounter
Admission: RE | Admit: 2016-12-27 | Discharge: 2016-12-27 | Disposition: A | Discharge status: Home / Self Care | Payer: Medicare Other | Source: Ambulatory Visit | Attending: Internal Medicine | Admitting: Internal Medicine

## 2016-12-27 ENCOUNTER — Inpatient Hospital Stay: Payer: Medicare Other

## 2016-12-27 ENCOUNTER — Encounter
Admission: RE | Admit: 2016-12-27 | Discharge: 2016-12-27 | Disposition: A | Discharge status: Home / Self Care | Payer: Medicare Other | Source: Ambulatory Visit | Attending: General Surgery | Admitting: General Surgery

## 2016-12-27 DIAGNOSIS — C8334 Diffuse large B-cell lymphoma, lymph nodes of axilla and upper limb: Secondary | ICD-10-CM | POA: Insufficient documentation

## 2016-12-27 HISTORY — DX: Malignant (primary) neoplasm, unspecified: C80.1

## 2016-12-27 LAB — COMPREHENSIVE METABOLIC PANEL
ALBUMIN: 4.1 g/dL (ref 3.5–5.0)
ALT: 14 U/L — AB (ref 17–63)
AST: 20 U/L (ref 15–41)
Alkaline Phosphatase: 51 U/L (ref 38–126)
Anion gap: 7 (ref 5–15)
BUN: 19 mg/dL (ref 6–20)
CHLORIDE: 110 mmol/L (ref 101–111)
CO2: 23 mmol/L (ref 22–32)
Calcium: 9.2 mg/dL (ref 8.9–10.3)
Creatinine, Ser: 1.28 mg/dL — ABNORMAL HIGH (ref 0.61–1.24)
GFR calc Af Amer: >60 mL/min (ref >60)
GFR, EST NON AFRICAN AMERICAN: 56 mL/min — AB (ref >60)
GLUCOSE: 112 mg/dL — AB (ref 65–99)
POTASSIUM: 3.9 mmol/L (ref 3.5–5.1)
SODIUM: 140 mmol/L (ref 135–145)
Total Bilirubin: 0.7 mg/dL (ref 0.3–1.2)
Total Protein: 6.9 g/dL (ref 6.5–8.1)

## 2016-12-27 LAB — CBC WITH DIFFERENTIAL/PLATELET
BASOS ABS: 0.1 10*3/uL (ref 0–0.1)
BASOS PCT: 1 %
EOS PCT: 4 %
Eosinophils Absolute: 0.3 10*3/uL (ref 0–0.7)
HCT: 41.2 % (ref 40.0–52.0)
Hemoglobin: 14.2 g/dL (ref 13.0–18.0)
LYMPHS PCT: 23 %
Lymphs Abs: 1.5 10*3/uL (ref 1.0–3.6)
MCH: 31.3 pg (ref 26.0–34.0)
MCHC: 34.6 g/dL (ref 32.0–36.0)
MCV: 90.5 fL (ref 80.0–100.0)
Monocytes Absolute: 0.4 10*3/uL (ref 0.2–1.0)
Monocytes Relative: 7 %
Neutro Abs: 4.2 10*3/uL (ref 1.4–6.5)
Neutrophils Relative %: 65 %
PLATELETS: 212 10*3/uL (ref 150–440)
RBC: 4.55 MIL/uL (ref 4.40–5.90)
RDW: 13.4 % (ref 11.5–14.5)
WBC: 6.4 10*3/uL (ref 3.8–10.6)

## 2016-12-27 LAB — LACTATE DEHYDROGENASE: LDH: 117 U/L (ref 98–192)

## 2016-12-27 MED ORDER — TECHNETIUM TC 99M-LABELED RED BLOOD CELLS IV KIT
20.0000 | PACK | Freq: Once | INTRAVENOUS | Status: AC | PRN
  Administered 2016-12-27: 22.93 via INTRAVENOUS

## 2016-12-27 NOTE — Patient Instructions (Signed)
Your procedure is scheduled [id number] December 29, 2016 Report to Same Day Surgery on the 2nd floor in the [facility] To find out your arrival time, please call [phone] between 1PM - 3PM on: Tuesday AUGUST 14, [address]  REMEMBER: Instructions that are not followed completely may result in serious medical risk up to and including death; or upon the discretion of your surgeon and anesthesiologist your surgery may need to be rescheduled.  Do not eat food or drink liquids after midnight. No gum chewing or hard candies  No Alcohol for 24 hours before or after surgery.  No Smoking for 24 hours prior to surgery.  Notify your doctor if there is any change in your medical condition (cold, fever, infection).  Do not wear jewelry, make-up, hairpins, clips or nail polish.  Do not wear lotions, powders, or perfumes.   Do not shave 48 hours prior to surgery. Men may shave face and neck.  Contacts and dentures may not be worn into surgery.  Do not bring valuables to the hospital. [facility] is not responsible for any belongings or valuables.   TAKE THESE MEDICATIONS THE MORNING OF SURGERY WITH A SIP OF WATER:  AMLODIPINE ZYRTEC OMEPRAZOLE   Use CHG Soap or wipes as directed on instruction sheet  Stop Anti-inflammatories such as Advil, Aleve, Ibuprofen, Motrin, Naproxen, Naprosyn, Goodie powder, or aspirin products. (May take Tylenol or Acetaminophen and Celebrex if needed.)  Stop supplements until after surgery. (May continue Vitamin D, Vitamin B, and multivitamin.)  If you are being admitted to the hospital overnight, leave your suitcase in the car. After surgery it may be brought to your room.  If you are being discharged the day of surgery, you will not be allowed to drive home. You will need someone to drive you home and stay with you that night.   If you are taking public transportation, you will need to have a responsible adult to with you.

## 2016-12-28 ENCOUNTER — Ambulatory Visit: Payer: Medicare Other

## 2016-12-28 LAB — HEPATITIS B CORE ANTIBODY, IGM: Hep B C IgM: NEGATIVE

## 2016-12-28 LAB — HIV ANTIBODY (ROUTINE TESTING W REFLEX): HIV Screen 4th Generation wRfx: NONREACTIVE

## 2016-12-28 LAB — HEPATITIS B SURFACE ANTIGEN: HEP B S AG: NEGATIVE

## 2016-12-28 MED ORDER — CEFAZOLIN SODIUM-DEXTROSE 2-4 GM/100ML-% IV SOLN
2.0000 g | INTRAVENOUS | Status: AC
  Administered 2016-12-29: 2 g via INTRAVENOUS

## 2016-12-29 ENCOUNTER — Encounter
Admission: RE | Disposition: A | Discharge status: Home / Self Care | Payer: Self-pay | Source: Ambulatory Visit | Attending: General Surgery

## 2016-12-29 ENCOUNTER — Ambulatory Visit: Payer: Medicare Other | Admitting: Anesthesiology

## 2016-12-29 ENCOUNTER — Ambulatory Visit: Payer: Medicare Other

## 2016-12-29 ENCOUNTER — Other Ambulatory Visit: Payer: Self-pay | Admitting: Radiology

## 2016-12-29 ENCOUNTER — Encounter: Payer: Self-pay | Admitting: *Deleted

## 2016-12-29 ENCOUNTER — Other Ambulatory Visit: Payer: Self-pay | Admitting: Internal Medicine

## 2016-12-29 ENCOUNTER — Ambulatory Visit
Admission: RE | Admit: 2016-12-29 | Discharge: 2016-12-29 | Disposition: A | Discharge status: Home / Self Care | Payer: Medicare Other | Source: Ambulatory Visit | Attending: General Surgery | Admitting: General Surgery

## 2016-12-29 DIAGNOSIS — C8330 Diffuse large B-cell lymphoma, unspecified site: Secondary | ICD-10-CM | POA: Insufficient documentation

## 2016-12-29 DIAGNOSIS — Z888 Allergy status to other drugs, medicaments and biological substances status: Secondary | ICD-10-CM | POA: Insufficient documentation

## 2016-12-29 DIAGNOSIS — Z882 Allergy status to sulfonamides status: Secondary | ICD-10-CM | POA: Diagnosis not present

## 2016-12-29 DIAGNOSIS — C8334 Diffuse large B-cell lymphoma, lymph nodes of axilla and upper limb: Secondary | ICD-10-CM

## 2016-12-29 DIAGNOSIS — Z825 Family history of asthma and other chronic lower respiratory diseases: Secondary | ICD-10-CM | POA: Insufficient documentation

## 2016-12-29 DIAGNOSIS — Z79899 Other long term (current) drug therapy: Secondary | ICD-10-CM | POA: Diagnosis not present

## 2016-12-29 DIAGNOSIS — I10 Essential (primary) hypertension: Secondary | ICD-10-CM | POA: Insufficient documentation

## 2016-12-29 DIAGNOSIS — K219 Gastro-esophageal reflux disease without esophagitis: Secondary | ICD-10-CM | POA: Insufficient documentation

## 2016-12-29 DIAGNOSIS — E785 Hyperlipidemia, unspecified: Secondary | ICD-10-CM | POA: Diagnosis not present

## 2016-12-29 DIAGNOSIS — Z8249 Family history of ischemic heart disease and other diseases of the circulatory system: Secondary | ICD-10-CM | POA: Diagnosis not present

## 2016-12-29 DIAGNOSIS — M1990 Unspecified osteoarthritis, unspecified site: Secondary | ICD-10-CM | POA: Diagnosis not present

## 2016-12-29 DIAGNOSIS — Z7982 Long term (current) use of aspirin: Secondary | ICD-10-CM | POA: Insufficient documentation

## 2016-12-29 DIAGNOSIS — Z9849 Cataract extraction status, unspecified eye: Secondary | ICD-10-CM | POA: Insufficient documentation

## 2016-12-29 DIAGNOSIS — Z95828 Presence of other vascular implants and grafts: Secondary | ICD-10-CM

## 2016-12-29 HISTORY — PX: PORTACATH PLACEMENT: SHX2246

## 2016-12-29 SURGERY — INSERTION, TUNNELED CENTRAL VENOUS DEVICE, WITH PORT
Anesthesia: Monitor Anesthesia Care | Wound class: Clean

## 2016-12-29 MED ORDER — ACETAMINOPHEN 10 MG/ML IV SOLN
INTRAVENOUS | Status: AC
  Filled 2016-12-29: qty 100

## 2016-12-29 MED ORDER — HEPARIN SODIUM (PORCINE) 5000 UNIT/ML IJ SOLN
INTRAMUSCULAR | Status: AC
  Filled 2016-12-29: qty 1

## 2016-12-29 MED ORDER — TRAMADOL HCL 50 MG PO TABS: 50.0000 mg | ORAL_TABLET | Freq: Four times a day (QID) | ORAL | 0 refills | Status: DC | PRN

## 2016-12-29 MED ORDER — BUPIVACAINE HCL 0.5 % IJ SOLN
INTRAMUSCULAR | Status: DC | PRN
  Administered 2016-12-29: 12 mL

## 2016-12-29 MED ORDER — CEFAZOLIN SODIUM-DEXTROSE 2-4 GM/100ML-% IV SOLN
INTRAVENOUS | Status: AC
  Filled 2016-12-29: qty 100

## 2016-12-29 MED ORDER — HEPARIN SODIUM (PORCINE) 1000 UNIT/ML IJ SOLN
INTRAMUSCULAR | Status: DC | PRN
  Administered 2016-12-29: 11:00:00 12 mL via INTRAMUSCULAR

## 2016-12-29 MED ORDER — PROPOFOL 10 MG/ML IV BOLUS
INTRAVENOUS | Status: AC
  Filled 2016-12-29: qty 20

## 2016-12-29 MED ORDER — FENTANYL CITRATE (PF) 100 MCG/2ML IJ SOLN
INTRAMUSCULAR | Status: AC
  Filled 2016-12-29: qty 2

## 2016-12-29 MED ORDER — ACETAMINOPHEN 10 MG/ML IV SOLN
INTRAVENOUS | Status: DC | PRN
  Administered 2016-12-29: 1000 mg via INTRAVENOUS

## 2016-12-29 MED ORDER — LIDOCAINE HCL (PF) 1 % IJ SOLN
INTRAMUSCULAR | Status: AC
  Filled 2016-12-29: qty 30

## 2016-12-29 MED ORDER — CHLORHEXIDINE GLUCONATE CLOTH 2 % EX PADS
6.0000 | MEDICATED_PAD | Freq: Once | CUTANEOUS | Status: DC
Start: 2016-12-29 — End: 2016-12-29

## 2016-12-29 MED ORDER — PROPOFOL 10 MG/ML IV BOLUS
INTRAVENOUS | Status: DC | PRN
  Administered 2016-12-29: 60 mg via INTRAVENOUS

## 2016-12-29 MED ORDER — MIDAZOLAM HCL 2 MG/2ML IJ SOLN
INTRAMUSCULAR | Status: AC
  Filled 2016-12-29: qty 2

## 2016-12-29 MED ORDER — LACTATED RINGERS IV SOLN
INTRAVENOUS | Status: DC
  Administered 2016-12-29: 09:00:00 via INTRAVENOUS

## 2016-12-29 MED ORDER — PROPOFOL 500 MG/50ML IV EMUL
INTRAVENOUS | Status: DC | PRN
  Administered 2016-12-29: 50 ug/kg/min via INTRAVENOUS

## 2016-12-29 MED ORDER — BUPIVACAINE HCL (PF) 0.5 % IJ SOLN
INTRAMUSCULAR | Status: AC
  Filled 2016-12-29: qty 30

## 2016-12-29 MED ORDER — MIDAZOLAM HCL 2 MG/2ML IJ SOLN
INTRAMUSCULAR | Status: DC | PRN
  Administered 2016-12-29: 2 mg via INTRAVENOUS

## 2016-12-29 MED ORDER — PROPOFOL 500 MG/50ML IV EMUL
INTRAVENOUS | Status: AC
  Filled 2016-12-29: qty 50

## 2016-12-29 SURGICAL SUPPLY — 29 items
BAG DECANTER FOR FLEXI CONT (MISCELLANEOUS) ×3 IMPLANT
BLADE SURG 15 STRL SS SAFETY (BLADE) ×3 IMPLANT
CANISTER SUCT 1200ML W/VALVE (MISCELLANEOUS) ×3 IMPLANT
CHLORAPREP W/TINT 26ML (MISCELLANEOUS) ×3 IMPLANT
COVER LIGHT HANDLE STERIS (MISCELLANEOUS) ×6 IMPLANT
DECANTER SPIKE VIAL GLASS SM (MISCELLANEOUS) ×6 IMPLANT
DERMABOND ADVANCED (GAUZE/BANDAGES/DRESSINGS) ×2
DERMABOND ADVANCED .7 DNX12 (GAUZE/BANDAGES/DRESSINGS) ×1 IMPLANT
DRAPE C-ARM XRAY 36X54 (DRAPES) ×3 IMPLANT
ELECT REM PT RETURN 9FT ADLT (ELECTROSURGICAL) ×3
ELECTRODE REM PT RTRN 9FT ADLT (ELECTROSURGICAL) ×1 IMPLANT
GLOVE BIO SURGEON STRL SZ7 (GLOVE) ×3 IMPLANT
GOWN STRL REUS W/ TWL LRG LVL3 (GOWN DISPOSABLE) ×2 IMPLANT
GOWN STRL REUS W/TWL LRG LVL3 (GOWN DISPOSABLE) ×4
IV NS 500ML (IV SOLUTION) ×2
IV NS 500ML BAXH (IV SOLUTION) ×1 IMPLANT
KIT PORT POWER 8FR ISP CVUE (Miscellaneous) ×3 IMPLANT
KIT RM TURNOVER STRD PROC AR (KITS) ×3 IMPLANT
LABEL OR SOLS (LABEL) ×3 IMPLANT
NEEDLE FILTER BLUNT 18X 1/2SAF (NEEDLE) ×2
NEEDLE FILTER BLUNT 18X1 1/2 (NEEDLE) ×1 IMPLANT
NEEDLE HYPO 25GX1X1/2 BEV (NEEDLE) ×3 IMPLANT
NS IRRIG 500ML POUR BTL (IV SOLUTION) ×3 IMPLANT
PACK PORT-A-CATH (MISCELLANEOUS) ×3 IMPLANT
SUT PROLENE 2 0 SH DA (SUTURE) ×3 IMPLANT
SUT VIC AB 3-0 SH 27 (SUTURE) ×2
SUT VIC AB 3-0 SH 27X BRD (SUTURE) ×1 IMPLANT
SUT VIC AB 4-0 FS2 27 (SUTURE) ×3 IMPLANT
SYR 3ML LL SCALE MARK (SYRINGE) ×3 IMPLANT

## 2016-12-29 NOTE — H&P (View-Only) (Signed)
Patient ID: Henry [name], male   DOB: 1946/04/06, 71 y.o.   MRN: [id number]  Chief Complaint  Patient presents with  . Follow-up    HPI Henry [name] is a 71 y.o. male here for discussion of pathology results following an axillary lymph node needle core biopsy. He reports no problems with the biopsy site.  HPI  Past Medical History:  Diagnosis Date  . Allergy   . Arthritis   . Cataract   . GERD (gastroesophageal reflux disease)   . Hyperlipidemia   . Hypertension     Past Surgical History:  Procedure Laterality Date  . CATARACT EXTRACTION    . MOLE REMOVAL    . NECK MASS EXCISION    . TONSILLECTOMY      Family History  Problem Relation Age of Onset  . Heart disease Mother   . Rheumatic fever Mother   . CAD Mother   . Heart attack Father   . COPD Sister     Social History Social History  Substance Use Topics  . Smoking status: Never Smoker  . Smokeless tobacco: Never Used  . Alcohol use 0.0 oz/week     Comment: 2 beers 4-5 times per week    Allergies  Allergen Reactions  . Lovastatin Other (See Comments)  . Pravastatin Nausea And Vomiting    joint ache  . Pravastatin Sodium     joint ache  . Simvastatin Other (See Comments)    muscle ache muscle ache  . Statins   . Sulfa Antibiotics Rash and Itching    Current Outpatient Prescriptions  Medication Sig Dispense Refill  . amLODipine (NORVASC) 5 MG tablet Take 1 tablet (5 mg total) by mouth daily. 90 tablet 3  . aspirin 81 MG tablet Take by mouth.    . cetirizine (ZYRTEC) 10 MG tablet Take 10 mg by mouth daily.    . cholecalciferol (VITAMIN D) 1000 UNITS tablet Take by mouth.    Marland Kitchen lisinopril (PRINIVIL,ZESTRIL) 40 MG tablet TAKE 1 TABLET (40 MG TOTAL) BY MOUTH DAILY. 90 tablet 2  . omeprazole (PRILOSEC) 20 MG capsule TAKE 1 CAPSULE (20 MG TOTAL) BY MOUTH DAILY. 90 capsule 3  . sildenafil (REVATIO) 20 MG tablet UP TO 5 TABLETS DAILY AS NEEDED 50 tablet 11  . triamcinolone cream (KENALOG) 0.1 % Apply 1  application topically 2 (two) times daily. To affected area. Only use for a week at a time. 30 g 0   No current facility-administered medications for this visit.     Review of Systems Review of Systems  Constitutional: Negative.   Respiratory: Negative.   Cardiovascular: Negative.     Blood pressure 140/84, pulse 82, resp. rate 14, height 6\' 1"  (1.854 m), weight 219 lb (99.3 kg).  Physical Exam Physical Exam  Constitutional: He is oriented to person, place, and time. He appears well-developed and well-nourished.  Neck: Neck supple.  Lymphadenopathy:    He has no cervical adenopathy.    He has axillary adenopathy (Axillary lymph node, no tenderness or evidence of regional inflammation).  Neurological: He is alert and oriented to person, place, and time.  Skin: Skin is warm and dry.  Psychiatric: He has a normal mood and affect.  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 Pt was agreeable.  Plan    Plan to schedule an appointment with the oncologist    HPI, Physical Exam, Assessment and Plan have been scribed under the direction and in the presence of Mckinley Jewel, MD.  Verlene Mayer, CMA  I have completed the exam and reviewed the above documentation for accuracy and completeness.  I agree with the above.  Haematologist has been used and any errors in dictation or transcription are unintentional.  Ardie Dragoo G. Jamal Collin, M.D., F.A.C.S.  Junie Panning G 12/20/2016, 3:46 PM

## 2016-12-29 NOTE — Anesthesia Post-op Follow-up Note (Signed)
Anesthesia QCDR form completed.        

## 2016-12-29 NOTE — H&P (View-Only) (Signed)
Patient ID: Henry [name], male   DOB: 1946/03/10, 71 y.o.   MRN: [id number]  Chief Complaint  Patient presents with  . Other    axillary mass    HPI Henry [name] is a 71 y.o. male here today for a evaluation of swelling and a ball under his left arm. Patient noticed this about two weeks ago. He denies any pain or redness.  He reports that the "ball" is gone but he still has some swelling in the area.  He reports that he does work in his garden daily.  HPI  Past Medical History:  Diagnosis Date  . Allergy   . Arthritis   . Cataract   . GERD (gastroesophageal reflux disease)   . Hyperlipidemia   . Hypertension     Past Surgical History:  Procedure Laterality Date  . CATARACT EXTRACTION    . MOLE REMOVAL    . NECK MASS EXCISION    . TONSILLECTOMY      Family History  Problem Relation Age of Onset  . Heart disease Mother   . Rheumatic fever Mother   . CAD Mother   . Heart attack Father   . COPD Sister     Social History Social History  Substance Use Topics  . Smoking status: Never Smoker  . Smokeless tobacco: Never Used  . Alcohol use 0.0 oz/week     Comment: 2 beers 4-5 times per week    Allergies  Allergen Reactions  . Lovastatin Other (See Comments)  . Pravastatin Nausea And Vomiting    joint ache  . Pravastatin Sodium     joint ache  . Simvastatin Other (See Comments)    muscle ache muscle ache  . Statins   . Sulfa Antibiotics Rash and Itching    Current Outpatient Prescriptions  Medication Sig Dispense Refill  . amLODipine (NORVASC) 5 MG tablet Take 1 tablet (5 mg total) by mouth daily. 90 tablet 3  . aspirin 81 MG tablet Take by mouth.    . cetirizine (ZYRTEC) 10 MG tablet Take 10 mg by mouth daily.    . cholecalciferol (VITAMIN D) 1000 UNITS tablet Take by mouth.    Marland Kitchen lisinopril (PRINIVIL,ZESTRIL) 40 MG tablet TAKE 1 TABLET (40 MG TOTAL) BY MOUTH DAILY. 90 tablet 2  . omeprazole (PRILOSEC) 20 MG capsule TAKE 1 CAPSULE (20 MG TOTAL) BY MOUTH  DAILY. 90 capsule 3  . sildenafil (REVATIO) 20 MG tablet UP TO 5 TABLETS DAILY AS NEEDED 50 tablet 11  . triamcinolone cream (KENALOG) 0.1 % Apply 1 application topically 2 (two) times daily. To affected area. Only use for a week at a time. 30 g 0   No current facility-administered medications for this visit.     Review of Systems Review of Systems  Constitutional: Negative.   Respiratory: Negative.   Cardiovascular: Negative.   Gastrointestinal: Negative.   Genitourinary: Negative.     Blood pressure 124/70, pulse 74, resp. rate 12, height 6\' 1"  (1.854 m), weight 222 lb (100.7 kg).  Physical Exam Physical Exam  Constitutional: He is oriented to person, place, and time. He appears well-developed and well-nourished.  Eyes: Conjunctivae are normal. No scleral icterus.  Neck: Neck supple.  Cardiovascular: Normal rate, regular rhythm and normal heart sounds.   Pulmonary/Chest: Effort normal and breath sounds normal.  Abdominal: Soft. Bowel sounds are normal. There is no tenderness. A hernia is present.    Lymphadenopathy:    He has no cervical adenopathy.  He has axillary adenopathy.       Right axillary: No pectoral and no lateral adenopathy present.       Left axillary: Lateral adenopathy present. No pectoral adenopathy present.      Right: No inguinal adenopathy present.       Left: No inguinal adenopathy present.  In the left axilla there is a 5 cm very firm smooth mobile mass.  Neurological: He is alert and oriented to person, place, and time.  Skin: Skin is warm and dry.   Ultrasound the left axilla was performed. There is a very large greater than 5 cm lobulated hypoechoic mass. No blood flow identified within the mass. This is consistent with abnormally enlarged suspicious lymph node or a group of lymph nodes matted lymph nodes.  Data Reviewed PCP notes  Assessment    Suspicious feeling and appearing large lymph node in the left axilla. Core biopsy is indicated.  Discussed fully with the patient and he is agreeable    Plan    core biopsy completed today. Await pathology report before deciding on further management     HPI, Physical Exam, Assessment and Plan have been scribed under the direction and in the presence of Mckinley Jewel, MD  Concepcion Living, LPN I have completed the exam and reviewed the above documentation for accuracy and completeness.  I agree with the above.  Haematologist has been used and any errors in dictation or transcription are unintentional.  Ruari Duggan G. Jamal Collin, M.D., F.A.C.S.   Junie Panning G 12/17/2016, 8:23 AM

## 2016-12-29 NOTE — Op Note (Signed)
Preop diagnosis: Diffuse large B cell lymphoma  Post op diagnosis: Same  Operation: Insertion venous access port with ultrasound and fluoroscopic guidance  Surgeon: Mckinley Jewel  Assistant:     Anesthesia:  monitored anesthesia care  Complications:  none  EBL:  minimal  Drains:  none  Description:  patient was placed supine on the operating table and then placed in slight Trendelenburg. With adequate sedation and monitoring the right upper chest and neck area were prepped and draped as sterile field and timeout performed. The ultrasound probe brought up the subclavian vein was identified beneath the lateral end of the clavicle. After local anesthetic of 0.5% Marcaine mixed with 1% Xylocaine instilled at the puncture site a small stab incision was made and with ultrasound guidance of needle was successfully positioned in the subclavian vein. Seldinger technique was then utilized placeing catheter going into the distal  SVC with fluoroscopic guidance. The skin mark at the puncture site was 20 cm. Local anesthetic was then instilled in the port site over the second interspace. Total amount used approximately 20 mL. A transverse incision was then made and a subcutaneous pocket created with the use of cautery. Catheter was tunneled through to the port site and cut to approximate length and then fixed to a prefilled port. Port was placed in the pocket and anchored to the underlying fascia with 3 stitches of 2-0 Prolene. Fluoroscopy was repeated to ensure of the catheter was showing no kinks along the course and the distal end was again noted near the distal SVC right atrial junction. Port was flushed through with heparinized saline. Subcutaneous tissue closed with 3-0 Vicryl. Skin closed with subcuticular 4-0 Vicryl covered with Dermabond. Procedure was well-tolerated and subsequently returned recovery room stable condition.

## 2016-12-29 NOTE — Transfer of Care (Signed)
Immediate Anesthesia Transfer of Care Note  Patient: Henry [name]  Procedure(s) Performed: Procedure(s): INSERTION PORT-A-CATH (N/A)  Patient Location: PACU  Anesthesia Type:General  Level of Consciousness: awake, alert  and oriented  Airway & Oxygen Therapy: Patient Spontanous Breathing  Post-op Assessment: Report given to RN and Post -op Vital signs reviewed and stable  Post vital signs: Reviewed and stable  Last Vitals:  Vitals:   12/29/16 0856 12/29/16 1100  BP: (!) 141/88 111/79  Pulse: 78 60  Resp: 16 14  Temp: (!) 35.8 C (!) 36.3 C  SpO2: 98% 98%    Last Pain:  Vitals:   12/29/16 0856  TempSrc: Tympanic         Complications: No apparent anesthesia complications

## 2016-12-29 NOTE — Interval H&P Note (Signed)
History and Physical Interval Note:  12/29/2016 9:32 AM  Henry [name]  has presented today for surgery, with the diagnosis of lymphoma  The various methods of treatment have been discussed with the patient and family. After consideration of risks, benefits and other options for treatment, the patient has consented to  Procedure(s): INSERTION PORT-A-CATH (N/A) as a surgical intervention .  The patient's history has been reviewed, patient examined, no change in status, stable for surgery.  I have reviewed the patient's chart and labs.  Questions were answered to the patient's satisfaction.     Sunny Gains G

## 2016-12-29 NOTE — Progress Notes (Signed)
START ON PATHWAY REGIMEN - Lymphoma and CLL     A cycle is every 21 days:     Rituximab      Cyclophosphamide      Doxorubicin      Vincristine      Prednisone   **Always confirm dose/schedule in your pharmacy ordering system**    Patient Characteristics: Diffuse Large B Cell Lymphoma, First Line, Stage I and II, No Bulk Disease Type: Not Applicable Disease Type: Diffuse Large B Cell Line of therapy: First Line [place] Stage: I Disease Characteristics: No Bulk Intent of Therapy: Curative Intent, Discussed with Patient

## 2016-12-29 NOTE — Interval H&P Note (Signed)
History and Physical Interval Note:  12/29/2016 9:32 AM  Henry [name]  has presented today for surgery, with the diagnosis of lymphoma  The various methods of treatment have been discussed with the patient and family. After consideration of risks, benefits and other options for treatment, the patient has consented to  Procedure(s): INSERTION PORT-A-CATH (N/A) as a surgical intervention .  The patient's history has been reviewed, patient examined, no change in status, stable for surgery.  I have reviewed the patient's chart and labs.  Questions were answered to the patient's satisfaction.     Shaundrea Carrigg G

## 2016-12-29 NOTE — Discharge Instructions (Signed)
AMBULATORY SURGERY  DISCHARGE INSTRUCTIONS   1) The drugs that you were given will stay in your system until tomorrow so for the next 24 hours you should not:  A) Drive an automobile B) Make any legal decisions C) Drink any alcoholic beverage   2) You may resume regular meals tomorrow.  Today it is better to start with liquids and gradually work up to solid foods.  You may eat anything you prefer, but it is better to start with liquids, then soup and crackers, and gradually work up to solid foods.   3) Please notify your doctor immediately if you have any unusual bleeding, trouble breathing, redness and pain at the surgery site, drainage, fever, or pain not relieved by medication.    4) Additional Instructions:drink plenty of fluids. Do not pull off dermabond from incision.  You may shower and pat it dry but do not put creams on incision site.   Please contact your physician with any problems or Same Day Surgery at [phone], Monday through Friday 6 am to 4 pm, or [facility] number at [phone].

## 2016-12-30 ENCOUNTER — Other Ambulatory Visit (HOSPITAL_COMMUNITY)
Admission: RE | Admit: 2016-12-30 | Discharge: 2016-12-30 | Disposition: A | Discharge status: Home / Self Care | Payer: Medicare Other | Source: Ambulatory Visit | Attending: Internal Medicine | Admitting: Internal Medicine

## 2016-12-30 ENCOUNTER — Ambulatory Visit
Admission: RE | Admit: 2016-12-30 | Discharge: 2016-12-30 | Disposition: A | Discharge status: Home / Self Care | Payer: Medicare Other | Source: Ambulatory Visit | Attending: Internal Medicine | Admitting: Internal Medicine

## 2016-12-30 DIAGNOSIS — I10 Essential (primary) hypertension: Secondary | ICD-10-CM | POA: Diagnosis not present

## 2016-12-30 DIAGNOSIS — C8334 Diffuse large B-cell lymphoma, lymph nodes of axilla and upper limb: Secondary | ICD-10-CM | POA: Diagnosis not present

## 2016-12-30 DIAGNOSIS — Z79899 Other long term (current) drug therapy: Secondary | ICD-10-CM | POA: Diagnosis not present

## 2016-12-30 DIAGNOSIS — E785 Hyperlipidemia, unspecified: Secondary | ICD-10-CM | POA: Diagnosis not present

## 2016-12-30 DIAGNOSIS — Z7982 Long term (current) use of aspirin: Secondary | ICD-10-CM | POA: Insufficient documentation

## 2016-12-30 DIAGNOSIS — Z8249 Family history of ischemic heart disease and other diseases of the circulatory system: Secondary | ICD-10-CM | POA: Diagnosis not present

## 2016-12-30 DIAGNOSIS — K219 Gastro-esophageal reflux disease without esophagitis: Secondary | ICD-10-CM | POA: Diagnosis not present

## 2016-12-30 LAB — CBC WITH DIFFERENTIAL/PLATELET
BASOS ABS: 0.1 10*3/uL (ref 0–0.1)
Basophils Relative: 1 %
Eosinophils Absolute: 0.2 10*3/uL (ref 0–0.7)
Eosinophils Relative: 4 %
HEMATOCRIT: 42.7 % (ref 40.0–52.0)
Hemoglobin: 14.7 g/dL (ref 13.0–18.0)
LYMPHS ABS: 1.1 10*3/uL (ref 1.0–3.6)
LYMPHS PCT: 16 %
MCH: 31.6 pg (ref 26.0–34.0)
MCHC: 34.5 g/dL (ref 32.0–36.0)
MCV: 91.4 fL (ref 80.0–100.0)
MONO ABS: 0.5 10*3/uL (ref 0.2–1.0)
MONOS PCT: 8 %
NEUTROS ABS: 4.8 10*3/uL (ref 1.4–6.5)
Neutrophils Relative %: 71 %
Platelets: 196 10*3/uL (ref 150–440)
RBC: 4.67 MIL/uL (ref 4.40–5.90)
RDW: 13.5 % (ref 11.5–14.5)
WBC: 6.8 10*3/uL (ref 3.8–10.6)

## 2016-12-30 LAB — PROTIME-INR
INR: 1.02
Prothrombin Time: 13.4 seconds (ref 11.4–15.2)

## 2016-12-30 LAB — APTT: aPTT: 33 seconds (ref 24–36)

## 2016-12-30 MED ORDER — MIDAZOLAM HCL 5 MG/5ML IJ SOLN
INTRAMUSCULAR | Status: AC | PRN
  Administered 2016-12-30 (×2): 1 mg via INTRAVENOUS
  Administered 2016-12-30: 0.5 mg via INTRAVENOUS

## 2016-12-30 MED ORDER — HEPARIN SOD (PORK) LOCK FLUSH 100 UNIT/ML IV SOLN
INTRAVENOUS | Status: AC
  Filled 2016-12-30: qty 5

## 2016-12-30 MED ORDER — MIDAZOLAM HCL 5 MG/5ML IJ SOLN
INTRAMUSCULAR | Status: AC
  Filled 2016-12-30: qty 5

## 2016-12-30 MED ORDER — FENTANYL CITRATE (PF) 100 MCG/2ML IJ SOLN
INTRAMUSCULAR | Status: AC
  Filled 2016-12-30: qty 4

## 2016-12-30 MED ORDER — FENTANYL CITRATE (PF) 100 MCG/2ML IJ SOLN
INTRAMUSCULAR | Status: AC | PRN
  Administered 2016-12-30: 50 ug via INTRAVENOUS
  Administered 2016-12-30 (×2): 25 ug via INTRAVENOUS

## 2016-12-30 MED ORDER — SODIUM CHLORIDE 0.9 % IV SOLN
INTRAVENOUS | Status: DC
  Administered 2016-12-30: 09:00:00 via INTRAVENOUS

## 2016-12-30 NOTE — Procedures (Signed)
Diffuse B-cell lymphoma  Status post CT right iliac bone marrow aspirate and core biopsy  No immediate complication  EBL less than 5 mL  Pathology pending  Full report in PACs

## 2016-12-30 NOTE — H&P (Signed)
Chief Complaint:  Diffuse large B-cell lymphoma   Referring Physician(s): Brahmanday,Govinda R   History of Present Illness: Henry [name] is a 71 y.o. male recently diagnosed with lymphoma. Plan for image guided bone marrow biopsy today. No current complaints.  Past Medical History:  Diagnosis Date  . Allergy   . Arthritis   . 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 Cataract   . GERD (gastroesophageal reflux disease)   . Hyperlipidemia   . Hypertension     Past Surgical History:  Procedure Laterality Date  . CATARACT EXTRACTION Bilateral   . MOLE REMOVAL    . NECK MASS EXCISION    . PORTACATH PLACEMENT N/A 12/29/2016   Procedure: INSERTION PORT-A-CATH;  Surgeon: Christene Lye, MD;  Location: [facility] ORS;  Service: General;  Laterality: N/A;  . TONSILLECTOMY      Allergies: Lovastatin; Pravastatin; Pravastatin sodium; Simvastatin; Statins; and Sulfa antibiotics  Medications: Prior to Admission medications   Medication Sig Start Date End Date Taking? Authorizing Provider  amLODipine (NORVASC) 5 MG tablet Take 1 tablet (5 mg total) by mouth daily. 03/18/16  Yes Jerrol Banana., MD  ibuprofen (ADVIL,MOTRIN) 200 MG tablet Take 800 mg by mouth every 8 (eight) hours as needed (for pain).   Yes [provider]  lidocaine-prilocaine (EMLA) cream Apply 1 application topically as needed. To port a cath 12/24/16  Yes Cammie Sickle, MD  lisinopril (PRINIVIL,ZESTRIL) 40 MG tablet TAKE 1 TABLET (40 MG TOTAL) BY MOUTH DAILY. 07/01/16  Yes Jerrol Banana., MD  omeprazole (PRILOSEC) 20 MG capsule TAKE 1 CAPSULE (20 MG TOTAL) BY MOUTH DAILY. 07/01/16  Yes Jerrol Banana., MD  triamcinolone cream (KENALOG) 0.1 % Apply 1 application topically 2 (two) times daily. To affected area. Only use for a week at a time. Patient taking differently: Apply 1 application topically 2 (two) times daily as needed (for rash/irritated skin). To affected area. Only use  for a week at a time. 03/18/16  Yes Jerrol Banana., MD  aspirin EC 81 MG tablet Take 81 mg by mouth daily.    [provider]  cetirizine (ZYRTEC) 10 MG tablet Take 10 mg by mouth daily.    [provider]  cholecalciferol (VITAMIN D) 1000 UNITS tablet Take 1,000 Units by mouth daily.  10/02/12   [provider]  docusate sodium (COLACE) 100 MG capsule Take 100 mg by mouth daily.    [provider]  ondansetron (ZOFRAN) 8 MG tablet Take 1 tablet (8 mg total) by mouth every 8 (eight) hours as needed for nausea or vomiting. 12/24/16   Cammie Sickle, MD  phenylephrine-shark liver oil-mineral oil-petrolatum (PREPARATION H) 0.25-14-74.9 % rectal ointment Place 1 application rectally 2 (two) times daily as needed for hemorrhoids.    [provider]  prochlorperazine (COMPAZINE) 10 MG tablet Take 1 tablet (10 mg total) by mouth every 6 (six) hours as needed for nausea or vomiting. 12/24/16   Cammie Sickle, MD  sildenafil (REVATIO) 20 MG tablet UP TO 5 TABLETS DAILY AS NEEDED Patient taking differently: UP TO 5 TABLETS DAILY AS NEEDED FOR ED 08/11/16   Jerrol Banana., MD  traMADol (ULTRAM) 50 MG tablet Take 1 tablet (50 mg total) by mouth every 6 (six) hours as needed. 12/29/16   Christene Lye, MD     Family History  Problem Relation Age of Onset  . Heart disease Mother   .  Rheumatic fever Mother   . CAD Mother   . Heart attack Father   . COPD Sister     Social History   Social History  . Marital status: Married    Spouse name: N/A  . Number of children: N/A  . Years of education: N/A   Social History Main Topics  . Smoking status: Never Smoker  . Smokeless tobacco: Never Used  . Alcohol use 0.0 oz/week     Comment: 2 beers 4-5 times per week  . Drug use: No  . Sexual activity: Not Asked   Other Topics Concern  . None   Social History Narrative  . None    ECOG Status: 0 - Asymptomatic  Review of  Systems: A 12 point ROS discussed and pertinent positives are indicated in the HPI above.  All other systems are negative.  Review of Systems  Vital Signs: BP 140/82   Pulse 65   Temp 98.5 F (36.9 C) (Oral)   Resp 16   SpO2 96%   Physical Exam  Constitutional: He is oriented to person, place, and time. He appears well-developed and well-nourished. No distress.  Eyes: Conjunctivae are normal. No scleral icterus.  Cardiovascular: Normal rate and regular rhythm.   No murmur heard. Pulmonary/Chest: Effort normal and breath sounds normal. No respiratory distress.  Abdominal: Soft. Bowel sounds are normal. He exhibits no distension.  Musculoskeletal: Normal range of motion. He exhibits no edema.  Neurological: He is alert and oriented to person, place, and time.  Skin: Skin is warm and dry. He is not diaphoretic. No erythema.  Psychiatric: He has a normal mood and affect. His behavior is normal.    Mallampati Score:   1  Imaging: Dg Chest 1 View  Result Date: 12/29/2016 CLINICAL DATA:  Status post Port-A-Cath placement. EXAM: CHEST 1 VIEW COMPARISON:  None in PACs FINDINGS: The lungs are well-expanded and clear. There is no postprocedure pneumothorax or hemothorax. The heart and pulmonary vascularity are normal. The porta catheter tip projects over the midportion of the SVC. The bony thorax is unremarkable. IMPRESSION: There is no postprocedure complication following Port-A-Cath placement. Electronically Signed   By: David  Martinique M.D.   On: 12/29/2016 11:24   Nm Cardiac Muga Rest  Result Date: 12/28/2016 CLINICAL DATA:  Lymphoma.  Soon to begin chemotherapy EXAM: NUCLEAR MEDICINE CARDIAC BLOOD POOL IMAGING (MUGA) TECHNIQUE: Cardiac multi-gated acquisition was performed at rest following intravenous injection of Tc-31mlabeled red blood cells. RADIOPHARMACEUTICALS:  22.93 mCi Tc-914mDP in-vitro labeled red blood cells IV COMPARISON:  None. FINDINGS: The left ventricular ejection  fraction is within normal limits equal to 56.5%. Normal left ventricular wall motion. IMPRESSION: 1. Left ventricular ejection fraction equals 56.5%. 2. No wall motion abnormalities. Electronically Signed   By: TaKerby Moors.D.   On: 12/28/2016 08:42   Nm Pet Image Initial (pi) Skull Base To Thigh  Result Date: 12/23/2016 CLINICAL DATA:  Initial Treatment strategy for lymphoma . EXAM: NUCLEAR MEDICINE PET SKULL BASE TO THIGH TECHNIQUE: 12.7 mCi F-18 FDG was injected intravenously. Full-ring PET imaging was performed from the skull base to thigh after the radiotracer. CT data was obtained and used for attenuation correction and anatomic localization. FASTING BLOOD GLUCOSE:  Value: 98 mg/dl COMPARISON:  None FINDINGS: NECK: No hypermetabolic lymph nodes in the neck. CHEST: Enlarged and hypermetabolic nodal mass in the left axilla measures 4.4 x 2.5 cm and has an SUV max equal to 36.6. No hypermetabolic supraclavicular or right axillary lymph  nodes. No hypermetabolic mediastinal or hilar lymph nodes. No pleural effusion.  No airspace consolidation or atelectasis. ABDOMEN/PELVIS: No abnormal hypermetabolic activity within the liver, pancreas, adrenal glands, or spleen. No hypermetabolic lymph nodes in the abdomen or pelvis. SKELETON: No focal hypermetabolic activity to suggest skeletal metastasis. IMPRESSION: 1. Within the left axilla there is a intensely hypermetabolic nodal mass within SUV max equal to 36.6. Imaging findings are compatible with the clinical history of lymphoma. 2. No additional sites of hypermetabolic tumor identified. Electronically Signed   By: Kerby Moors M.D.   On: 12/23/2016 16:07   [place] Limited Joint Space Structures Up Left  Result Date: 12/17/2016 Ultrasound evaluation of the left axilla was performed to assess a palpable large mass. There is a lobulated large   hypoechoic mass measuring in excess of 5 cm in maximal length. No other large lymph nodes identified. The dominant mass is  likely a group of lymph nodes matted and enlarged. No blood flow was identified within the mass. Core biopsy of this mass was recommended to the patient. He was agreeable and procedure completed. With the ultrasound guidance 10 mL of 0.5% Marcaine mixed with 1% Xylocaine was instilled along the planned core biopsy tract. Area was prepped with the ChloraPrep, sterile towels were placed. Tiny stab incision was made. A 14-gauge Bard core biopsy device was then employed with ultrasound guidance obtaining 6 samples. Specimen was sent to pathology. The stab incision was closed with Steri-Strips and tincture benzoin. Telfa and Tegaderm dressings placed. No immediate problems encountered from the procedure.   Dg C-arm 1-60 Min-no Report  Result Date: 12/29/2016 Fluoroscopy was utilized by the requesting physician.  No radiographic interpretation.    Labs:  CBC:  Recent Labs  08/03/16 1117 12/27/16 1408 12/30/16 0803  WBC 6.3 6.4 6.8  HGB 15.0 14.2 14.7  HCT 44.4 41.2 42.7  PLT 217 212 196    COAGS: No results for input(s): INR, APTT in the last 8760 hours.  BMP:  Recent Labs  08/03/16 1117 12/27/16 1408  NA 143 140  K 4.6 3.9  CL 104 110  CO2 27 23  GLUCOSE 92 112*  BUN 14 19  CALCIUM 9.7 9.2  CREATININE 1.31* 1.28*  GFRNONAA 56* 56*  GFRAA 64 >60    LIVER FUNCTION TESTS:  Recent Labs  08/03/16 1117 12/27/16 1408  BILITOT 0.5 0.7  AST 17 20  ALT 12 14*  ALKPHOS 74 51  PROT 6.9 6.9  ALBUMIN 4.2 4.1    TUMOR MARKERS: No results for input(s): AFPTM, CEA, CA199, CHROMGRNA in the last 8760 hours.  Assessment and Plan:  Recently diagnosed diffuse large B-cell lymphoma. Plan for CT-guided bone marrow aspirate and core biopsy today.  Risks and benefits discussed with the patient including, but not limited to bleeding, infection, damage to adjacent structures or low yield requiring additional tests. All of the patient's questions were answered, patient is agreeable to  proceed. Consent signed and in chart.    Thank you for this interesting consult.  I greatly enjoyed meeting Henry [name] and look forward to participating in their care.  A copy of this report was sent to the requesting provider on this date.  Electronically Signed: Greggory Keen, MD 12/30/2016, 8:31 AM   I spent a total of  15 Minutes   in face to face in clinical consultation, greater than 50% of which was counseling/coordinating care for this patient with lymphoma.

## 2016-12-31 ENCOUNTER — Inpatient Hospital Stay (HOSPITAL_BASED_OUTPATIENT_CLINIC_OR_DEPARTMENT_OTHER): Payer: Medicare Other | Admitting: Internal Medicine

## 2016-12-31 VITALS — BP 142/87 | HR 79 | Temp 97.6°F | Resp 16 | Wt 219.6 lb

## 2016-12-31 DIAGNOSIS — K219 Gastro-esophageal reflux disease without esophagitis: Secondary | ICD-10-CM

## 2016-12-31 DIAGNOSIS — C8334 Diffuse large B-cell lymphoma, lymph nodes of axilla and upper limb: Secondary | ICD-10-CM | POA: Diagnosis not present

## 2016-12-31 DIAGNOSIS — N181 Chronic kidney disease, stage 1: Secondary | ICD-10-CM | POA: Diagnosis not present

## 2016-12-31 DIAGNOSIS — Z79899 Other long term (current) drug therapy: Secondary | ICD-10-CM

## 2016-12-31 DIAGNOSIS — Z7982 Long term (current) use of aspirin: Secondary | ICD-10-CM

## 2016-12-31 DIAGNOSIS — E785 Hyperlipidemia, unspecified: Secondary | ICD-10-CM

## 2016-12-31 DIAGNOSIS — Z7952 Long term (current) use of systemic steroids: Secondary | ICD-10-CM | POA: Diagnosis not present

## 2016-12-31 DIAGNOSIS — M1990 Unspecified osteoarthritis, unspecified site: Secondary | ICD-10-CM

## 2016-12-31 DIAGNOSIS — I129 Hypertensive chronic kidney disease with stage 1 through stage 4 chronic kidney disease, or unspecified chronic kidney disease: Secondary | ICD-10-CM

## 2016-12-31 MED ORDER — PREDNISONE 50 MG PO TABS: ORAL_TABLET | ORAL | 4 refills | Status: DC

## 2016-12-31 NOTE — Assessment & Plan Note (Addendum)
Diffuse large B-cell lymphoma- stage I based on PET scan [right axillary adenopathy only]; bone marrow biopsy pending. GCB subtype. Positive for myc-IHC. FISH for myc/BCL-2/ bcl-6 gene rearrangements-pending. Again spoke to pathology lab re: FISH testing.   # Long discussion the patient regarding the plan of care- plan is R-CHOP every 3 weeks 3 cycles followed by radiation. I discussed the individual drugs- and also the adverse events. Given prescription for prednisone 100 mg/day x 5 days.   Discussed the potential side effects including but not limited to-increasing fatigue, nausea vomiting, diarrhea, hair loss, sores in the mouth, increase risk of infection and also neuropathy. Also discussed regarding potential side effects of heart failure/leukemia with Adriamycin. Aug 2018- MUGA EF ~56%. Patient's hepatitis panel negative.  Growth factor-Neulasta/On pro would be given as prophylaxis for chemotherapy-induced neutropenia to prevent febrile neutropenias. Discussed potential side effect- myalgias/arthralgias- recommend Claritin for 7 days.   # CKD- Stage I- monitor for now.   # Follow up in on sep 10th- labs/R-CHOP #2; On pro. 10 days- cbc/bmp.   # I reviewed the blood work- with the patient in detail; also reviewed the imaging independently [as summarized above]; and with the patient in detail.

## 2016-12-31 NOTE — Progress Notes (Signed)
**Henry Henry** Henry Henry  Patient Care Team: Jerrol Banana., MD as PCP - General (Family Medicine) Carmon Ginsberg, [place] as Referring Physician (Family Medicine) Christene Lye, MD (General Surgery)  CHIEF COMPLAINTS/PURPOSE OF CONSULTATION:  Lymphoma  #  Oncology History   # AUG 6th 2018- DLBCL [LEFT axilla- Core Bx- Dr.Sankar; CD-20, bcl-2;bcl-6 positive]; Ki- 67 >90%. MUM-1 NEG. GCB subtype; Myc- POS; FISH studies-pending. PET scan- Right Ax LN [4.5cm; SUV 35]     # AUG 13th 2018. MUGA scan-56%.      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  Henry Henry of diffuse large B-cell lymphoma- is here to review the results of his PET scan/ plan treatment.  In the interim patient underwent port placement. He also underwent a bone marrow biopsy yesterday results pending.  He denies the left under arm swelling getting any significantly worse.   Patient continues to deny any pain. Denies any night sweats. He denies any lumps or bumps anywhere else. No nausea no vomiting. No headaches. No night sweats no fevers or chills.    ROS: A complete 10 point review of system is done which is negative except mentioned above in history of present illness  MEDICAL HISTORY:  Past Medical History:  [name] Date  . Allergy   . Arthritis   . Cancer ([place])    left axillary  . Cataract   . GERD (gastroesophageal reflux disease)   . Hyperlipidemia   . Hypertension     SURGICAL HISTORY: Past Surgical History:  Procedure Laterality Date  . CATARACT EXTRACTION Bilateral   . MOLE REMOVAL    . NECK MASS EXCISION    . PORTACATH PLACEMENT N/A 12/29/2016   Procedure: INSERTION PORT-A-CATH;  Surgeon: Christene Lye, MD;  Location: [facility] ORS;  Service: General;  Laterality: N/A;  . TONSILLECTOMY      SOCIAL HISTORY:lives in [place], never smoked; 2 beers/ day. retd  Development worker, international aid. 2 grown up- children.  Social History   Social History  . Marital status: Married    Spouse name: N/A  . Number of children: N/A  . Years of education: N/A   Occupational History  . Not on file.   Social History Main Topics  . Smoking status: Never Smoker  . Smokeless tobacco: Never Used  . Alcohol use 0.0 oz/week     Comment: 2 beers 4-5 times per week  . Drug use: No  . Sexual activity: Not on file   Other Topics Concern  . Not on file   Social History Narrative  . No narrative on file    FAMILY HISTORY: Family History  Problem Relation Age of Onset  . Heart disease Mother   . Rheumatic fever Mother   . CAD Mother   . Heart attack Father   . COPD Sister     ALLERGIES:  is allergic to lovastatin; pravastatin; pravastatin sodium; simvastatin; statins; and sulfa antibiotics.  MEDICATIONS:  Current Outpatient Prescriptions  Medication Sig Dispense Refill  . amLODipine (NORVASC) 5 MG tablet Take 1 tablet (5 mg total) by mouth daily. 90 tablet 3  . aspirin EC 81 MG tablet Take 81 mg by mouth daily.    . cetirizine (ZYRTEC) 10 MG tablet Take 10 mg by mouth daily.    . cholecalciferol (VITAMIN D) 1000 UNITS tablet Take 1,000 Units by mouth daily.     Marland Kitchen  docusate sodium (COLACE) 100 MG capsule Take 100 mg by mouth daily.    Marland Kitchen ibuprofen (ADVIL,MOTRIN) 200 MG tablet Take 800 mg by mouth every 8 (eight) hours as needed (for pain).    Marland Kitchen lidocaine-prilocaine (EMLA) cream Apply 1 application topically as needed. To port a cath 30 g 3  . lisinopril (PRINIVIL,ZESTRIL) 40 MG tablet TAKE 1 TABLET (40 MG TOTAL) BY MOUTH DAILY. 90 tablet 2  . omeprazole (PRILOSEC) 20 MG capsule TAKE 1 CAPSULE (20 MG TOTAL) BY MOUTH DAILY. 90 capsule 3  . ondansetron (ZOFRAN) 8 MG tablet Take 1 tablet (8 mg total) by mouth every 8 (eight) hours as needed for nausea or vomiting. 20 tablet 3  . phenylephrine-shark liver oil-mineral oil-petrolatum (PREPARATION H) 0.25-14-74.9 %  rectal ointment Place 1 application rectally 2 (two) times daily as needed for hemorrhoids.    . prochlorperazine (COMPAZINE) 10 MG tablet Take 1 tablet (10 mg total) by mouth every 6 (six) hours as needed for nausea or vomiting. 30 tablet 3  . sildenafil (REVATIO) 20 MG tablet UP TO 5 TABLETS DAILY AS NEEDED (Patient taking differently: UP TO 5 TABLETS DAILY AS NEEDED FOR ED) 50 tablet 11  . triamcinolone cream (KENALOG) 0.1 % Apply 1 application topically 2 (two) times daily. To affected area. Only use for a week at a time. (Patient taking differently: Apply 1 application topically 2 (two) times daily as needed (for rash/irritated skin). To affected area. Only use for a week at a time.) 30 g 0  . predniSONE (DELTASONE) 50 MG tablet Take 2 tablets once day x 5 days. START on day of your chemotherapy. Take with food. 10 tablet 4   No current facility-administered medications for this visit.       Marland Kitchen  PHYSICAL EXAMINATION: ECOG PERFORMANCE STATUS: 1 - Symptomatic but completely ambulatory  Vitals:   12/31/16 1110  BP: (!) 142/87  Pulse: 79  Resp: 16  Temp: 97.6 F (36.4 C)   Filed Weights   12/31/16 1110  Weight: 219 lb 9.6 oz (99.6 kg)    GENERAL: Well-nourished well-developed; Alert, no distress and comfortable.   Accompanied by his wife/daughter.  EYES: no pallor or icterus OROPHARYNX: no thrush or ulceration; good dentition  NECK: supple, no masses felt LYMPH:  no palpable lymphadenopathy in the cervical,  or inguinal regions. Soft vague/palpable mass noted in the left axilla ~3-4 cm in size LUNGS: clear to auscultation and  No wheeze or crackles HEART/CVS: regular rate & rhythm and no murmurs; No lower extremity edema ABDOMEN: abdomen soft, non-tender and normal bowel sounds Musculoskeletal:no cyanosis of digits and no clubbing  PSYCH: alert & oriented x 3 with fluent speech NEURO: no focal motor/sensory deficits SKIN:  no rashes or significant lesions  LABORATORY DATA:   I have reviewed the data as listed Lab Results  Component Value Date   WBC 6.8 12/30/2016   HGB 14.7 12/30/2016   HCT 42.7 12/30/2016   MCV 91.4 12/30/2016   PLT 196 12/30/2016    Recent Labs  08/03/16 1117 12/27/16 1408  NA 143 140  K 4.6 3.9  CL 104 110  CO2 27 23  GLUCOSE 92 112*  BUN 14 19  CREATININE 1.31* 1.28*  CALCIUM 9.7 9.2  GFRNONAA 56* 56*  GFRAA 64 >60  PROT 6.9 6.9  ALBUMIN 4.2 4.1  AST 17 20  ALT 12 14*  ALKPHOS 74 51  BILITOT 0.5 0.7    RADIOGRAPHIC STUDIES: I have personally reviewed the radiological  images as listed and agreed with the findings in the report. Dg Chest 1 View  Result Date: 12/29/2016 CLINICAL DATA:  Status post Port-A-Cath placement. EXAM: CHEST 1 VIEW COMPARISON:  None in PACs FINDINGS: The lungs are well-expanded and clear. There is no postprocedure pneumothorax or hemothorax. The heart and pulmonary vascularity are normal. The porta catheter tip projects over the midportion of the SVC. The bony thorax is unremarkable. IMPRESSION: There is no postprocedure complication following Port-A-Cath placement. Electronically Signed   By: David  Martinique M.D.   On: 12/29/2016 11:24   Nm Cardiac Muga Rest  Result Date: 12/28/2016 CLINICAL DATA:  Lymphoma.  Soon to begin chemotherapy EXAM: NUCLEAR MEDICINE CARDIAC BLOOD POOL IMAGING (MUGA) TECHNIQUE: Cardiac multi-gated acquisition was performed at rest following intravenous injection of Tc-49mlabeled red blood cells. RADIOPHARMACEUTICALS:  22.93 mCi Tc-947mDP in-vitro labeled red blood cells IV COMPARISON:  None. FINDINGS: The left ventricular ejection fraction is within normal limits equal to 56.5%. Normal left ventricular wall motion. IMPRESSION: 1. Left ventricular ejection fraction equals 56.5%. 2. No wall motion abnormalities. Electronically Signed   By: TaKerby Moors.D.   On: 12/28/2016 08:42   Nm Pet Image Initial (pi) Skull Base To Thigh  Result Date: 12/23/2016 CLINICAL DATA:  Initial  Treatment strategy for lymphoma . EXAM: NUCLEAR MEDICINE PET SKULL BASE TO THIGH TECHNIQUE: 12.7 mCi F-18 FDG was injected intravenously. Full-ring PET imaging was performed from the skull base to thigh after the radiotracer. CT data was obtained and used for attenuation correction and anatomic localization. FASTING BLOOD GLUCOSE:  Value: 98 mg/dl COMPARISON:  None FINDINGS: NECK: No hypermetabolic lymph nodes in the neck. CHEST: Enlarged and hypermetabolic nodal mass in the left axilla measures 4.4 x 2.5 cm and has an SUV max equal to 36.6. No hypermetabolic supraclavicular or right axillary lymph nodes. No hypermetabolic mediastinal or hilar lymph nodes. No pleural effusion.  No airspace consolidation or atelectasis. ABDOMEN/PELVIS: No abnormal hypermetabolic activity within the liver, pancreas, adrenal glands, or spleen. No hypermetabolic lymph nodes in the abdomen or pelvis. SKELETON: No focal hypermetabolic activity to suggest skeletal metastasis. IMPRESSION: 1. Within the left axilla there is a intensely hypermetabolic nodal mass within SUV max equal to 36.6. Imaging findings are compatible with the clinical history of lymphoma. 2. No additional sites of hypermetabolic tumor identified. Electronically Signed   By: TaKerby Moors.D.   On: 12/23/2016 16:07   UsKoreaimited Joint Space Structures Up Left  Result Date: 12/17/2016 Ultrasound evaluation of the left axilla was performed to assess a palpable large mass. There is a lobulated large   hypoechoic mass measuring in excess of 5 cm in maximal length. No other large lymph nodes identified. The dominant mass is likely a group of lymph nodes matted and enlarged. No blood flow was identified within the mass. Core biopsy of this mass was recommended to the patient. He was agreeable and procedure completed. With the ultrasound guidance 10 mL of 0.5% Marcaine mixed with 1% Xylocaine was instilled along the planned core biopsy tract. Area was prepped with the  ChloraPrep, sterile towels were placed. Tiny stab incision was made. A 14-gauge Bard core biopsy device was then employed with ultrasound guidance obtaining 6 samples. Specimen was sent to pathology. The stab incision was closed with Steri-Strips and tincture benzoin. Telfa and Tegaderm dressings placed. No immediate problems encountered from the procedure.   Ct Biopsy  Result Date: 12/30/2016 INDICATION: DIFFUSE B-CELL LYMPHOMA EXAM: CT GUIDED RIGHT ILIAC BONE MARROW  ASPIRATION AND CORE BIOPSY Date:  8/16/20188/16/2018 10:04 am Radiologist:  M. Daryll Brod, MD Guidance:  CT FLUOROSCOPY TIME:  Fluoroscopy Time: None. MEDICATIONS: None. ANESTHESIA/SEDATION: 2.5 mg IV Versed; 100 mcg IV Fentanyl Moderate Sedation Time:  19 minutes The patient was continuously monitored during the procedure by the interventional radiology nurse under my direct supervision. CONTRAST:  None. COMPLICATIONS: None PROCEDURE: Informed consent was obtained from the patient following explanation of the procedure, risks, benefits and alternatives. The patient understands, agrees and consents for the procedure. All questions were addressed. A time out was performed. The patient was positioned prone and non-contrast localization CT was performed of the pelvis to demonstrate the iliac marrow spaces. Maximal barrier sterile technique utilized including caps, mask, sterile gowns, sterile gloves, large sterile drape, hand hygiene, and Betadine prep. Under sterile conditions and local anesthesia, an 11 gauge coaxial bone biopsy needle was advanced into the right iliac marrow space. Needle position was confirmed with CT imaging. Initially, bone marrow aspiration was performed. Next, the 11 gauge outer cannula was utilized to obtain a right iliac bone marrow core biopsy. Needle was removed. Hemostasis was obtained with compression. The patient tolerated the procedure well. Samples were prepared with the cytotechnologist. No immediate complications.  IMPRESSION: CT guided right iliac bone marrow aspiration and core biopsy. Electronically Signed   By: Jerilynn Mages.  Shick M.D.   On: 12/30/2016 11:19   Dg C-arm 1-60 Min-no Report  Result Date: 12/29/2016 Fluoroscopy was utilized by the requesting physician.  No radiographic interpretation.    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 GCB subtype. Positive for myc-IHC. FISH for myc/BCL-2/ bcl-6 gene rearrangements-pending. Again spoke to pathology lab re: FISH testing.   # Long discussion the patient regarding the plan of care- plan is R-CHOP every 3 weeks 3 cycles followed by radiation. I discussed the individual drugs- and also the adverse events. Given prescription for prednisone 100 mg/day x 5 days.   Discussed the potential side effects including but not limited to-increasing fatigue, nausea vomiting, diarrhea, hair loss, sores in the mouth, increase risk of infection and also neuropathy. Also discussed regarding potential side effects of heart failure/leukemia with Adriamycin. Aug 2018- MUGA EF ~56%. Patient's hepatitis panel negative.  Growth factor-Neulasta/On pro would be given as prophylaxis for chemotherapy-induced neutropenia to prevent febrile neutropenias. Discussed potential side effect- myalgias/arthralgias- recommend Claritin for 7 days.   # CKD- Stage I- monitor for now.   # Follow up in on sep 10th- labs/R-CHOP #2; On pro. 10 days- cbc/bmp.   # I reviewed the blood work- with the patient in detail; also reviewed the imaging independently [as summarized above]; and with the patient in detail.   All questions were answered. The patient knows to call the clinic with any problems, questions or concerns.    Cammie Sickle, MD 12/31/2016 1:09 PM

## 2017-01-03 ENCOUNTER — Other Ambulatory Visit: Payer: Self-pay | Admitting: Internal Medicine

## 2017-01-03 ENCOUNTER — Inpatient Hospital Stay: Payer: Medicare Other

## 2017-01-03 VITALS — BP 154/81 | HR 60 | Temp 96.2°F | Resp 18

## 2017-01-03 DIAGNOSIS — C8334 Diffuse large B-cell lymphoma, lymph nodes of axilla and upper limb: Secondary | ICD-10-CM

## 2017-01-03 MED ORDER — DIPHENHYDRAMINE HCL 25 MG PO CAPS
50.0000 mg | ORAL_CAPSULE | Freq: Once | ORAL | Status: AC
  Administered 2017-01-03: 50 mg via ORAL
  Filled 2017-01-03: qty 2

## 2017-01-03 MED ORDER — SODIUM CHLORIDE 0.9 % IV SOLN
1700.0000 mg | Freq: Once | INTRAVENOUS | Status: AC
  Administered 2017-01-03: 1700 mg via INTRAVENOUS
  Filled 2017-01-03: qty 85

## 2017-01-03 MED ORDER — DOXORUBICIN HCL CHEMO IV INJECTION 2 MG/ML
50.0000 mg/m2 | Freq: Once | INTRAVENOUS | Status: AC
Start: 2017-01-03 — End: 2017-01-03
  Administered 2017-01-03: 114 mg via INTRAVENOUS
  Filled 2017-01-03: qty 57

## 2017-01-03 MED ORDER — DEXAMETHASONE SODIUM PHOSPHATE 10 MG/ML IJ SOLN
10.0000 mg | Freq: Once | INTRAMUSCULAR | Status: AC
  Administered 2017-01-03: 10 mg via INTRAVENOUS
  Filled 2017-01-03: qty 1

## 2017-01-03 MED ORDER — HEPARIN SOD (PORK) LOCK FLUSH 100 UNIT/ML IV SOLN
500.0000 [IU] | Freq: Once | INTRAVENOUS | Status: AC | PRN
  Administered 2017-01-03: 500 [IU]
  Filled 2017-01-03: qty 5

## 2017-01-03 MED ORDER — SODIUM CHLORIDE 0.9 % IV SOLN
375.0000 mg/m2 | Freq: Once | INTRAVENOUS | Status: AC
  Administered 2017-01-03: 900 mg via INTRAVENOUS
  Filled 2017-01-03: qty 50

## 2017-01-03 MED ORDER — SODIUM CHLORIDE 0.9 % IV SOLN: 10.0000 mg | Freq: Once | INTRAVENOUS | Status: DC

## 2017-01-03 MED ORDER — PALONOSETRON HCL INJECTION 0.25 MG/5ML
0.2500 mg | Freq: Once | INTRAVENOUS | Status: AC
  Administered 2017-01-03: 0.25 mg via INTRAVENOUS
  Filled 2017-01-03: qty 5

## 2017-01-03 MED ORDER — SODIUM CHLORIDE 0.9 % IV SOLN
Freq: Once | INTRAVENOUS | Status: AC
Start: 2017-01-03 — End: 2017-01-03
  Administered 2017-01-03: 09:00:00 via INTRAVENOUS
  Filled 2017-01-03: qty 1000

## 2017-01-03 MED ORDER — PEGFILGRASTIM 6 MG/0.6ML ~~LOC~~ PSKT
6.0000 mg | PREFILLED_SYRINGE | Freq: Once | SUBCUTANEOUS | Status: AC
  Administered 2017-01-03: 6 mg via SUBCUTANEOUS
  Filled 2017-01-03: qty 0.6

## 2017-01-03 MED ORDER — VINCRISTINE SULFATE CHEMO INJECTION 1 MG/ML
2.0000 mg | Freq: Once | INTRAVENOUS | Status: AC
  Administered 2017-01-03: 2 mg via INTRAVENOUS
  Filled 2017-01-03: qty 2

## 2017-01-03 MED ORDER — SODIUM CHLORIDE 0.9% FLUSH
10.0000 mL | INTRAVENOUS | Status: DC | PRN
  Administered 2017-01-03: 10 mL
  Filled 2017-01-03: qty 10

## 2017-01-03 MED ORDER — ACETAMINOPHEN 325 MG PO TABS
650.0000 mg | ORAL_TABLET | Freq: Once | ORAL | Status: AC
  Administered 2017-01-03: 650 mg via ORAL
  Filled 2017-01-03: qty 2

## 2017-01-04 ENCOUNTER — Telehealth: Payer: Self-pay | Admitting: *Deleted

## 2017-01-04 NOTE — Anesthesia Preprocedure Evaluation (Signed)
Anesthesia Evaluation  Patient identified by MRN, date of birth, ID band Patient awake    Reviewed: Allergy & Precautions, H&P , NPO status , Patient's Chart, lab work & pertinent test results, reviewed documented beta blocker date and time   Airway Mallampati: II   Neck ROM: full    Dental  (+) Poor Dentition   Pulmonary neg pulmonary ROS,    Pulmonary exam normal        Cardiovascular hypertension, negative cardio ROS Normal cardiovascular exam Rhythm:regular Rate:Normal     Neuro/Psych negative neurological ROS  negative psych ROS   GI/Hepatic negative GI ROS, Neg liver ROS,   Endo/Other  negative endocrine ROS  Renal/GU negative Renal ROS  negative genitourinary   Musculoskeletal   Abdominal   Peds  Hematology negative hematology ROS (+)   Anesthesia Other Findings Past Medical History: No date: Allergy No date: Arthritis No date: Cancer [facility])     Comment:  left axillary No date: Cataract No date: GERD (gastroesophageal reflux disease) No date: Hyperlipidemia No date: Hypertension Past Surgical History: No date: CATARACT EXTRACTION; Bilateral No date: MOLE REMOVAL No date: NECK MASS EXCISION 12/29/2016: PORTACATH PLACEMENT; N/A     Comment:  Procedure: INSERTION PORT-A-CATH;  Surgeon: Christene Lye, MD;  Location: [facility] ORS;  Service:               General;  Laterality: N/A; No date: TONSILLECTOMY BMI    Body Mass Index:  29.69 kg/m     Reproductive/Obstetrics negative OB ROS                             Anesthesia Physical Anesthesia Plan  ASA: III  Anesthesia Plan: General   Post-op Pain Management:    Induction:   PONV Risk Score and Plan:   Airway Management Planned:   Additional Equipment:   Intra-op Plan:   Post-operative Plan:   Informed Consent: I have reviewed the patients History and Physical, chart, labs and discussed the  procedure including the risks, benefits and alternatives for the proposed anesthesia with the patient or authorized representative who has indicated his/her understanding and acceptance.   Dental Advisory Given  Plan Discussed with: CRNA  Anesthesia Plan Comments:         Anesthesia Quick Evaluation

## 2017-01-04 NOTE — Telephone Encounter (Signed)
msg left for patient-returning pt's phone call. Will have MD reach out to patient tomorrow to further discuss test results.

## 2017-01-04 NOTE — Telephone Encounter (Signed)
-----   Message from Wallene Dales sent at 01/04/2017  4:17 PM EDT ----- Regarding: Biopsy Results Please call pt with Biopsy Results. He is really wanting them and he doesn't see Dr. Jacinto Reap again until Sept 10th, 2018.  Thank you

## 2017-01-04 NOTE — Anesthesia Postprocedure Evaluation (Signed)
Anesthesia Post Note  Patient: Henry [name]  Procedure(s) Performed: Procedure(s) (LRB): INSERTION PORT-A-CATH (N/A)  Patient location during evaluation: PACU Anesthesia Type: General Level of consciousness: awake and alert Pain management: pain level controlled Vital Signs Assessment: post-procedure vital signs reviewed and stable Respiratory status: spontaneous breathing, nonlabored ventilation, respiratory function stable and patient connected to nasal cannula oxygen Cardiovascular status: blood pressure returned to baseline and stable Postop Assessment: no signs of nausea or vomiting Anesthetic complications: no     Last Vitals:  Vitals:   12/29/16 1147 12/29/16 1249  BP: 132/87 129/70  Pulse: (!) 46 60  Resp: 15   Temp: (!) 35.9 C   SpO2: 99% 100%    Last Pain:  Vitals:   12/29/16 1249  TempSrc:   PainSc: 0-No pain                 Molli Barrows

## 2017-01-05 ENCOUNTER — Encounter: Payer: Self-pay | Admitting: General Surgery

## 2017-01-05 ENCOUNTER — Ambulatory Visit (INDEPENDENT_AMBULATORY_CARE_PROVIDER_SITE_OTHER): Payer: Medicare Other | Admitting: General Surgery

## 2017-01-05 VITALS — BP 134/76 | HR 72 | Resp 14 | Ht 73.0 in | Wt 223.0 lb

## 2017-01-05 DIAGNOSIS — C8334 Diffuse large B-cell lymphoma, lymph nodes of axilla and upper limb: Secondary | ICD-10-CM

## 2017-01-05 NOTE — Telephone Encounter (Signed)
Msg left for patient to return my phone call. I personally spoke with pt's daughter Henry [name] to also provide the results.

## 2017-01-05 NOTE — Progress Notes (Signed)
Patient ID: Henry [name], male   DOB: Mar 26, 1946, 71 y.o.   MRN: [id number]  Chief Complaint  Patient presents with  . Routine Post Op    HPI Henry [name] is a 71 y.o. male here today for his follow up port placement done on 12/29/2016. Patient states he is doing well. First treatment was on Monday.  HPI  Past Medical History:  Diagnosis Date  . Allergy   . Arthritis   . Cancer ([place])    left axillary  . Cataract   . GERD (gastroesophageal reflux disease)   . Hyperlipidemia   . Hypertension     Past Surgical History:  Procedure Laterality Date  . CATARACT EXTRACTION Bilateral   . MOLE REMOVAL    . NECK MASS EXCISION    . PORTACATH PLACEMENT N/A 12/29/2016   Procedure: INSERTION PORT-A-CATH;  Surgeon: Christene Lye, MD;  Location: [facility] ORS;  Service: General;  Laterality: N/A;  . TONSILLECTOMY      Family History  Problem Relation Age of Onset  . Heart disease Mother   . Rheumatic fever Mother   . CAD Mother   . Heart attack Father   . COPD Sister     Social History Social History  Substance Use Topics  . Smoking status: Never Smoker  . Smokeless tobacco: Never Used  . Alcohol use 0.0 oz/week     Comment: 2 beers 4-5 times per week    Allergies  Allergen Reactions  . Lovastatin Other (See Comments)  . Pravastatin Nausea And Vomiting    joint ache  . Pravastatin Sodium     joint ache  . Simvastatin Other (See Comments)    muscle ache muscle ache  . Statins   . Sulfa Antibiotics Rash and Itching    Current Outpatient Prescriptions  Medication Sig Dispense Refill  . amLODipine (NORVASC) 5 MG tablet Take 1 tablet (5 mg total) by mouth daily. 90 tablet 3  . aspirin EC 81 MG tablet Take 81 mg by mouth daily.    . cetirizine (ZYRTEC) 10 MG tablet Take 10 mg by mouth daily.    . cholecalciferol (VITAMIN D) 1000 UNITS tablet Take 1,000 Units by mouth daily.     Marland Kitchen docusate sodium (COLACE) 100 MG capsule Take 100 mg by mouth daily.    Marland Kitchen ibuprofen  (ADVIL,MOTRIN) 200 MG tablet Take 800 mg by mouth every 8 (eight) hours as needed (for pain).    Marland Kitchen lidocaine-prilocaine (EMLA) cream Apply 1 application topically as needed. To port a cath 30 g 3  . lisinopril (PRINIVIL,ZESTRIL) 40 MG tablet TAKE 1 TABLET (40 MG TOTAL) BY MOUTH DAILY. 90 tablet 2  . omeprazole (PRILOSEC) 20 MG capsule TAKE 1 CAPSULE (20 MG TOTAL) BY MOUTH DAILY. 90 capsule 3  . ondansetron (ZOFRAN) 8 MG tablet Take 1 tablet (8 mg total) by mouth every 8 (eight) hours as needed for nausea or vomiting. 20 tablet 3  . phenylephrine-shark liver oil-mineral oil-petrolatum (PREPARATION H) 0.25-14-74.9 % rectal ointment Place 1 application rectally 2 (two) times daily as needed for hemorrhoids.    . predniSONE (DELTASONE) 50 MG tablet Take 2 tablets once day x 5 days. START on day of your chemotherapy. Take with food. 10 tablet 4  . prochlorperazine (COMPAZINE) 10 MG tablet Take 1 tablet (10 mg total) by mouth every 6 (six) hours as needed for nausea or vomiting. 30 tablet 3  . sildenafil (REVATIO) 20 MG tablet UP TO 5 TABLETS DAILY AS NEEDED (  Patient taking differently: UP TO 5 TABLETS DAILY AS NEEDED FOR ED) 50 tablet 11  . triamcinolone cream (KENALOG) 0.1 % Apply 1 application topically 2 (two) times daily. To affected area. Only use for a week at a time. (Patient taking differently: Apply 1 application topically 2 (two) times daily as needed (for rash/irritated skin). To affected area. Only use for a week at a time.) 30 g 0   No current facility-administered medications for this visit.     Review of Systems Review of Systems  Constitutional: Negative.   Respiratory: Negative.   Cardiovascular: Negative.     Blood pressure 134/76, pulse 72, resp. rate 14, height 6\' 1"  (1.854 m), weight 223 lb (101.2 kg).  Physical Exam Physical Exam  Constitutional: He is oriented to person, place, and time. He appears well-developed and well-nourished.  Pulmonary/Chest: Effort normal and  breath sounds normal.  Neurological: He is alert and oriented to person, place, and time.  Skin: Skin is warm and dry.   Port site is clean and healing well.  Data Reviewed Prior notes reviewed   Assessment    Diffuse large B-cell lymphoma, appears to involving only left axillary node. Chemo started    Plan     Patient to return as needed. The patient is aware to call back for any questions or concerns.   HPI, Physical Exam, Assessment and Plan have been scribed under the direction and in the presence of Mckinley Jewel, MD  Gaspar Cola, CMA   Junie Panning G 01/05/2017, 1:46 PM

## 2017-01-05 NOTE — Telephone Encounter (Signed)
Patient returned phone call at 1127- results also provided to patient.

## 2017-01-05 NOTE — Patient Instructions (Signed)
Return as needed

## 2017-01-10 ENCOUNTER — Inpatient Hospital Stay: Payer: Medicare Other

## 2017-01-10 DIAGNOSIS — C8334 Diffuse large B-cell lymphoma, lymph nodes of axilla and upper limb: Secondary | ICD-10-CM | POA: Diagnosis not present

## 2017-01-10 LAB — CBC WITH DIFFERENTIAL/PLATELET
BASOS ABS: 0 10*3/uL (ref 0–0.1)
Basophils Relative: 0 %
EOS PCT: 8 %
Eosinophils Absolute: 0.2 10*3/uL (ref 0–0.7)
HEMATOCRIT: 37.6 % — AB (ref 40.0–52.0)
HEMOGLOBIN: 13.2 g/dL (ref 13.0–18.0)
LYMPHS PCT: 21 %
Lymphs Abs: 0.5 10*3/uL — ABNORMAL LOW (ref 1.0–3.6)
MCH: 31.6 pg (ref 26.0–34.0)
MCHC: 35.1 g/dL (ref 32.0–36.0)
MCV: 90.2 fL (ref 80.0–100.0)
MONOS PCT: 7 %
Monocytes Absolute: 0.2 10*3/uL (ref 0.2–1.0)
Neutro Abs: 1.5 10*3/uL (ref 1.4–6.5)
Neutrophils Relative %: 64 %
Platelets: 103 10*3/uL — ABNORMAL LOW (ref 150–440)
RBC: 4.17 MIL/uL — AB (ref 4.40–5.90)
RDW: 13.1 % (ref 11.5–14.5)
WBC: 2.4 10*3/uL — AB (ref 3.8–10.6)

## 2017-01-10 LAB — COMPREHENSIVE METABOLIC PANEL
ALBUMIN: 3.6 g/dL (ref 3.5–5.0)
ALK PHOS: 77 U/L (ref 38–126)
ALT: 32 U/L (ref 17–63)
AST: 17 U/L (ref 15–41)
Anion gap: 6 (ref 5–15)
BILIRUBIN TOTAL: 0.9 mg/dL (ref 0.3–1.2)
BUN: 20 mg/dL (ref 6–20)
CO2: 28 mmol/L (ref 22–32)
CREATININE: 1.24 mg/dL (ref 0.61–1.24)
Calcium: 8.8 mg/dL — ABNORMAL LOW (ref 8.9–10.3)
Chloride: 102 mmol/L (ref 101–111)
GFR calc Af Amer: >60 mL/min (ref >60)
GFR calc non Af Amer: 58 mL/min — ABNORMAL LOW (ref >60)
Glucose, Bld: 106 mg/dL — ABNORMAL HIGH (ref 65–99)
POTASSIUM: 3.8 mmol/L (ref 3.5–5.1)
Sodium: 136 mmol/L (ref 135–145)
Total Protein: 6.4 g/dL — ABNORMAL LOW (ref 6.5–8.1)

## 2017-01-14 ENCOUNTER — Encounter: Payer: Self-pay | Admitting: Family Medicine

## 2017-01-14 ENCOUNTER — Encounter (HOSPITAL_COMMUNITY): Payer: Self-pay

## 2017-01-21 LAB — TISSUE HYBRIDIZATION TO NCBH

## 2017-01-21 LAB — CHROMOSOME ANALYSIS, BONE MARROW

## 2017-01-24 ENCOUNTER — Inpatient Hospital Stay: Payer: Medicare Other

## 2017-01-24 ENCOUNTER — Inpatient Hospital Stay: Payer: Medicare Other | Attending: Internal Medicine | Admitting: Internal Medicine

## 2017-01-24 VITALS — BP 125/82 | HR 84 | Temp 98.2°F | Resp 16 | Wt 219.6 lb

## 2017-01-24 DIAGNOSIS — C8334 Diffuse large B-cell lymphoma, lymph nodes of axilla and upper limb: Secondary | ICD-10-CM | POA: Diagnosis present

## 2017-01-24 DIAGNOSIS — G620 Drug-induced polyneuropathy: Secondary | ICD-10-CM | POA: Diagnosis not present

## 2017-01-24 DIAGNOSIS — Z5111 Encounter for antineoplastic chemotherapy: Secondary | ICD-10-CM | POA: Diagnosis not present

## 2017-01-24 DIAGNOSIS — I129 Hypertensive chronic kidney disease with stage 1 through stage 4 chronic kidney disease, or unspecified chronic kidney disease: Secondary | ICD-10-CM | POA: Diagnosis not present

## 2017-01-24 DIAGNOSIS — K219 Gastro-esophageal reflux disease without esophagitis: Secondary | ICD-10-CM

## 2017-01-24 DIAGNOSIS — Z7952 Long term (current) use of systemic steroids: Secondary | ICD-10-CM | POA: Diagnosis not present

## 2017-01-24 DIAGNOSIS — Z7689 Persons encountering health services in other specified circumstances: Secondary | ICD-10-CM | POA: Insufficient documentation

## 2017-01-24 DIAGNOSIS — T451X5S Adverse effect of antineoplastic and immunosuppressive drugs, sequela: Secondary | ICD-10-CM | POA: Diagnosis not present

## 2017-01-24 DIAGNOSIS — N181 Chronic kidney disease, stage 1: Secondary | ICD-10-CM

## 2017-01-24 DIAGNOSIS — Z7982 Long term (current) use of aspirin: Secondary | ICD-10-CM | POA: Diagnosis not present

## 2017-01-24 DIAGNOSIS — E785 Hyperlipidemia, unspecified: Secondary | ICD-10-CM | POA: Diagnosis not present

## 2017-01-24 DIAGNOSIS — Z79899 Other long term (current) drug therapy: Secondary | ICD-10-CM | POA: Insufficient documentation

## 2017-01-24 DIAGNOSIS — K5900 Constipation, unspecified: Secondary | ICD-10-CM | POA: Insufficient documentation

## 2017-01-24 DIAGNOSIS — M1990 Unspecified osteoarthritis, unspecified site: Secondary | ICD-10-CM | POA: Diagnosis not present

## 2017-01-24 LAB — COMPREHENSIVE METABOLIC PANEL
ALBUMIN: 3.9 g/dL (ref 3.5–5.0)
ALT: 17 U/L (ref 17–63)
ANION GAP: 6 (ref 5–15)
AST: 17 U/L (ref 15–41)
Alkaline Phosphatase: 72 U/L (ref 38–126)
BILIRUBIN TOTAL: 0.6 mg/dL (ref 0.3–1.2)
BUN: 18 mg/dL (ref 6–20)
CO2: 25 mmol/L (ref 22–32)
Calcium: 9.4 mg/dL (ref 8.9–10.3)
Chloride: 107 mmol/L (ref 101–111)
Creatinine, Ser: 1.37 mg/dL — ABNORMAL HIGH (ref 0.61–1.24)
GFR calc Af Amer: 60 mL/min — ABNORMAL LOW (ref >60)
GFR, EST NON AFRICAN AMERICAN: 51 mL/min — AB (ref >60)
GLUCOSE: 97 mg/dL (ref 65–99)
POTASSIUM: 4 mmol/L (ref 3.5–5.1)
Sodium: 138 mmol/L (ref 135–145)
TOTAL PROTEIN: 7 g/dL (ref 6.5–8.1)

## 2017-01-24 LAB — CBC WITH DIFFERENTIAL/PLATELET
Basophils Absolute: 0.2 10*3/uL — ABNORMAL HIGH (ref 0–0.1)
Basophils Relative: 3 %
EOS ABS: 0.1 10*3/uL (ref 0–0.7)
Eosinophils Relative: 2 %
HEMATOCRIT: 39.4 % — AB (ref 40.0–52.0)
HEMOGLOBIN: 14.1 g/dL (ref 13.0–18.0)
LYMPHS ABS: 1 10*3/uL (ref 1.0–3.6)
LYMPHS PCT: 14 %
MCH: 31.8 pg (ref 26.0–34.0)
MCHC: 35.8 g/dL (ref 32.0–36.0)
MCV: 88.9 fL (ref 80.0–100.0)
MONOS PCT: 11 %
Monocytes Absolute: 0.7 10*3/uL (ref 0.2–1.0)
NEUTROS ABS: 4.9 10*3/uL (ref 1.4–6.5)
NEUTROS PCT: 70 %
PLATELETS: 362 10*3/uL (ref 150–440)
RBC: 4.43 MIL/uL (ref 4.40–5.90)
RDW: 13.3 % (ref 11.5–14.5)
WBC: 6.9 10*3/uL (ref 3.8–10.6)

## 2017-01-24 LAB — LACTATE DEHYDROGENASE: LDH: 118 U/L (ref 98–192)

## 2017-01-24 MED ORDER — SODIUM CHLORIDE 0.9 % IV SOLN
900.0000 mg | Freq: Once | INTRAVENOUS | Status: AC
  Administered 2017-01-24: 900 mg via INTRAVENOUS
  Filled 2017-01-24: qty 50

## 2017-01-24 MED ORDER — SODIUM CHLORIDE 0.9 % IV SOLN
Freq: Once | INTRAVENOUS | Status: AC
  Administered 2017-01-24: 10:00:00 via INTRAVENOUS
  Filled 2017-01-24: qty 1000

## 2017-01-24 MED ORDER — PEGFILGRASTIM 6 MG/0.6ML ~~LOC~~ PSKT
6.0000 mg | PREFILLED_SYRINGE | Freq: Once | SUBCUTANEOUS | Status: AC
  Administered 2017-01-24: 6 mg via SUBCUTANEOUS
  Filled 2017-01-24: qty 0.6

## 2017-01-24 MED ORDER — DOXORUBICIN HCL CHEMO IV INJECTION 2 MG/ML
50.0000 mg/m2 | Freq: Once | INTRAVENOUS | Status: AC
  Administered 2017-01-24: 114 mg via INTRAVENOUS
  Filled 2017-01-24: qty 57

## 2017-01-24 MED ORDER — ACETAMINOPHEN 325 MG PO TABS
650.0000 mg | ORAL_TABLET | Freq: Once | ORAL | Status: AC
Start: 2017-01-24 — End: 2017-01-24
  Administered 2017-01-24: 650 mg via ORAL
  Filled 2017-01-24: qty 2

## 2017-01-24 MED ORDER — RITUXIMAB CHEMO INJECTION 500 MG/50ML
375.0000 mg/m2 | Freq: Once | INTRAVENOUS | Status: DC
Start: 2017-01-24 — End: 2017-01-24

## 2017-01-24 MED ORDER — SODIUM CHLORIDE 0.9% FLUSH
10.0000 mL | Freq: Once | INTRAVENOUS | Status: AC
  Administered 2017-01-24: 10 mL via INTRAVENOUS
  Filled 2017-01-24: qty 10

## 2017-01-24 MED ORDER — HEPARIN SOD (PORK) LOCK FLUSH 100 UNIT/ML IV SOLN
500.0000 [IU] | Freq: Once | INTRAVENOUS | Status: AC
  Administered 2017-01-24: 500 [IU] via INTRAVENOUS
  Filled 2017-01-24: qty 5

## 2017-01-24 MED ORDER — DEXAMETHASONE SODIUM PHOSPHATE 10 MG/ML IJ SOLN
10.0000 mg | Freq: Once | INTRAMUSCULAR | Status: AC
  Administered 2017-01-24: 10 mg via INTRAVENOUS
  Filled 2017-01-24: qty 1

## 2017-01-24 MED ORDER — SODIUM CHLORIDE 0.9 % IV SOLN: 10.0000 mg | Freq: Once | INTRAVENOUS | Status: DC

## 2017-01-24 MED ORDER — DIPHENHYDRAMINE HCL 25 MG PO CAPS
50.0000 mg | ORAL_CAPSULE | Freq: Once | ORAL | Status: AC
  Administered 2017-01-24: 50 mg via ORAL
  Filled 2017-01-24: qty 2

## 2017-01-24 MED ORDER — SODIUM CHLORIDE 0.9 % IV SOLN
1700.0000 mg | Freq: Once | INTRAVENOUS | Status: AC
  Administered 2017-01-24: 1700 mg via INTRAVENOUS
  Filled 2017-01-24: qty 85

## 2017-01-24 MED ORDER — VINCRISTINE SULFATE CHEMO INJECTION 1 MG/ML
2.0000 mg | Freq: Once | INTRAVENOUS | Status: AC
  Administered 2017-01-24: 2 mg via INTRAVENOUS
  Filled 2017-01-24: qty 2

## 2017-01-24 MED ORDER — PALONOSETRON HCL INJECTION 0.25 MG/5ML
0.2500 mg | Freq: Once | INTRAVENOUS | Status: AC
  Administered 2017-01-24: 0.25 mg via INTRAVENOUS
  Filled 2017-01-24: qty 5

## 2017-01-24 NOTE — Progress Notes (Signed)
[place] NOTE  Patient Care Team: Jerrol Banana., MD as PCP - General (Family Medicine) Carmon Ginsberg, [place] as Referring Physician (Family Medicine) Christene Lye, MD (General Surgery)  CHIEF COMPLAINTS/PURPOSE OF CONSULTATION:  Lymphoma  #  Oncology History   # AUG 6th 2018- DLBCL STAGE I [LEFT axilla- Core Bx- Dr.Sankar; CD-20, bcl-2;bcl-6 positive]; Ki- 67 >90%. MUM-1 NEG. GCB subtype; Myc- POS; FISH studies-NEGATIVE for gene RE-ARRANGEMENTS [positive for extra-copies of bcl-6/ myc/IgH/bcl-2]PET scan- Right Ax LN [4.5cm; SUV 35]  # R-CHOP [aug 20th 2018]   # AUG 13th 2018. MUGA scan-56%.      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  male with diffuse large B-cell lymphoma stage I currently status post cycle #1 of R- CHOP chemotherapy.  Patient denies any pain. Admits to noticing improvement of the left axillary mass. Complains of mild tingling and numbness in his hands and feet from the chemotherapy. However this is not interrupting his daily lifestyle.  He denies any nausea vomiting. Denies any chills. No night sweats. Admits to constipation- currently improved.  ROS: A complete 10 point review of system is done which is negative except mentioned above in history of present illness  MEDICAL HISTORY:  Past Medical History:  Diagnosis Date  . Allergy   . Arthritis   . Cancer ([place])    left axillary  . Cataract   . GERD (gastroesophageal reflux disease)   . Hyperlipidemia   . Hypertension     SURGICAL HISTORY: Past Surgical History:  Procedure Laterality Date  . CATARACT EXTRACTION Bilateral   . MOLE REMOVAL    . NECK MASS EXCISION    . PORTACATH PLACEMENT N/A 12/29/2016   Procedure: INSERTION PORT-A-CATH;  Surgeon: Christene Lye, MD;  Location: [facility] ORS;  Service: General;  Laterality: N/A;  . TONSILLECTOMY      SOCIAL  HISTORY:lives in [place], never smoked; 2 beers/ day. 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  Social History   Social History  . Marital status: Married    Spouse name: N/A  . Number of children: N/A  . Years of education: N/A   Occupational History  . Not on file.   Social History Main Topics  . Smoking status: Never Smoker  . Smokeless tobacco: Never Used  . Alcohol use 0.0 oz/week     Comment: 2 beers 4-5 times per week  . Drug use: No  . Sexual activity: Not on file   Other Topics Concern  . Not on file   Social History Narrative  . No narrative on file    FAMILY HISTORY: Family History  Problem Relation Age of Onset  . Heart disease Mother   . Rheumatic fever Mother   . CAD Mother   . Heart attack Father   . COPD Sister     ALLERGIES:  is allergic to lovastatin; pravastatin; pravastatin sodium; simvastatin; statins; and sulfa antibiotics.  MEDICATIONS:  Current Outpatient Prescriptions  Medication Sig Dispense Refill  . amLODipine (NORVASC) 5 MG tablet Take 1 tablet (5 mg total) by mouth daily. 90 tablet 3  . aspirin EC 81 MG tablet Take 81 mg by mouth daily.    . cetirizine (ZYRTEC) 10 MG tablet Take 10 mg by mouth daily.    . cholecalciferol (VITAMIN D) 1000 UNITS tablet Take 1,000 Units by mouth daily.     Marland Kitchen  docusate sodium (COLACE) 100 MG capsule Take 100 mg by mouth daily.    Marland Kitchen ibuprofen (ADVIL,MOTRIN) 200 MG tablet Take 800 mg by mouth every 8 (eight) hours as needed (for pain).    Marland Kitchen lidocaine-prilocaine (EMLA) cream Apply 1 application topically as needed. To port a cath 30 g 3  . lisinopril (PRINIVIL,ZESTRIL) 40 MG tablet TAKE 1 TABLET (40 MG TOTAL) BY MOUTH DAILY. 90 tablet 2  . omeprazole (PRILOSEC) 20 MG capsule TAKE 1 CAPSULE (20 MG TOTAL) BY MOUTH DAILY. 90 capsule 3  . ondansetron (ZOFRAN) 8 MG tablet Take 1 tablet (8 mg total) by mouth every 8 (eight) hours as needed for nausea or vomiting. 20 tablet 3  . phenylephrine-shark liver  oil-mineral oil-petrolatum (PREPARATION H) 0.25-14-74.9 % rectal ointment Place 1 application rectally 2 (two) times daily as needed for hemorrhoids.    . predniSONE (DELTASONE) 50 MG tablet Take 2 tablets once day x 5 days. START on day of your chemotherapy. Take with food. 10 tablet 4  . prochlorperazine (COMPAZINE) 10 MG tablet Take 1 tablet (10 mg total) by mouth every 6 (six) hours as needed for nausea or vomiting. 30 tablet 3  . sildenafil (REVATIO) 20 MG tablet UP TO 5 TABLETS DAILY AS NEEDED (Patient not taking: Reported on 01/24/2017) 50 tablet 11  . triamcinolone cream (KENALOG) 0.1 % Apply 1 application topically 2 (two) times daily. To affected area. Only use for a week at a time. (Patient not taking: Reported on 01/24/2017) 30 g 0   No current facility-administered medications for this visit.    Facility-Administered Medications Ordered in Other Visits  Medication Dose Route Frequency Provider Last Rate Last Dose  . heparin lock flush 100 unit/mL  500 Units Intravenous Once Charlaine Dalton R, MD      . pegfilgrastim (NEULASTA ONPRO KIT) injection 6 mg  6 mg Subcutaneous Once Charlaine Dalton R, MD          .  PHYSICAL EXAMINATION: ECOG PERFORMANCE STATUS: 1 - Symptomatic but completely ambulatory  Vitals:   01/24/17 0940  BP: 125/82  Pulse: 84  Resp: 16  Temp: 98.2 F (36.8 C)   Filed Weights   01/24/17 0940  Weight: 219 lb 9.6 oz (99.6 kg)    GENERAL: Well-nourished well-developed; Alert, no distress and comfortable. He is alone. EYES: no pallor or icterus OROPHARYNX: no thrush or ulceration; good dentition  NECK: supple, no masses felt LYMPH:  no palpable lymphadenopathy in the cervical,  or inguinal regions. Soft vague/palpable mass noted in the left axilla ~ 1 cm in size [improved] LUNGS: clear to auscultation and  No wheeze or crackles HEART/CVS: regular rate & rhythm and no murmurs; No lower extremity edema ABDOMEN: abdomen soft, non-tender and normal  bowel sounds Musculoskeletal:no cyanosis of digits and no clubbing  PSYCH: alert & oriented x 3 with fluent speech NEURO: no focal motor/sensory deficits SKIN:  no rashes or significant lesions  LABORATORY DATA:  I have reviewed the data as listed Lab Results  Component Value Date   WBC 6.9 01/24/2017   HGB 14.1 01/24/2017   HCT 39.4 (L) 01/24/2017   MCV 88.9 01/24/2017   PLT 362 01/24/2017    Recent Labs  12/27/16 1408 01/10/17 1018 01/24/17 0918  NA 140 136 138  K 3.9 3.8 4.0  CL 110 102 107  CO2 _0 GLUCOSE 112* 106* 97  BUN _1 CREATININE 1.28* 1.24 1.37*  CALCIUM 9.2 8.8* 9.4  GFRNONAA  56* 58* 51*  GFRAA >60 >60 60*  PROT 6.9 6.4* 7.0  ALBUMIN 4.1 3.6 3.9  AST _0 ALT 14* 32 17  ALKPHOS 51 77 72  BILITOT 0.7 0.9 0.6    RADIOGRAPHIC STUDIES: I have personally reviewed the radiological images as listed and agreed with the findings in the report. Dg Chest 1 View  Result Date: 12/29/2016 CLINICAL DATA:  Status post Port-A-Cath placement. EXAM: CHEST 1 VIEW COMPARISON:  None in PACs FINDINGS: The lungs are well-expanded and clear. There is no postprocedure pneumothorax or hemothorax. The heart and pulmonary vascularity are normal. The porta catheter tip projects over the midportion of the SVC. The bony thorax is unremarkable. IMPRESSION: There is no postprocedure complication following Port-A-Cath placement. Electronically Signed   By: David  Martinique M.D.   On: 12/29/2016 11:24   Nm Cardiac Muga Rest  Result Date: 12/28/2016 CLINICAL DATA:  Lymphoma.  Soon to begin chemotherapy EXAM: NUCLEAR MEDICINE CARDIAC BLOOD POOL IMAGING (MUGA) TECHNIQUE: Cardiac multi-gated acquisition was performed at rest following intravenous injection of Tc-18mlabeled red blood cells. RADIOPHARMACEUTICALS:  22.93 mCi Tc-965mDP in-vitro labeled red blood cells IV COMPARISON:  None. FINDINGS: The left ventricular ejection fraction is within normal limits equal to 56.5%.  Normal left ventricular wall motion. IMPRESSION: 1. Left ventricular ejection fraction equals 56.5%. 2. No wall motion abnormalities. Electronically Signed   By: TaKerby Moors.D.   On: 12/28/2016 08:42   Ct Biopsy  Result Date: 12/30/2016 INDICATION: DIFFUSE B-CELL LYMPHOMA EXAM: CT GUIDED RIGHT ILIAC BONE MARROW ASPIRATION AND CORE BIOPSY Date:  8/16/20188/16/2018 10:04 am Radiologist:  M. TrDaryll BrodMD Guidance:  CT FLUOROSCOPY TIME:  Fluoroscopy Time: None. MEDICATIONS: None. ANESTHESIA/SEDATION: 2.5 mg IV Versed; 100 mcg IV Fentanyl Moderate Sedation Time:  19 minutes The patient was continuously monitored during the procedure by the interventional radiology nurse under my direct supervision. CONTRAST:  None. COMPLICATIONS: None PROCEDURE: Informed consent was obtained from the patient following explanation of the procedure, risks, benefits and alternatives. The patient understands, agrees and consents for the procedure. All questions were addressed. A time out was performed. The patient was positioned prone and non-contrast localization CT was performed of the pelvis to demonstrate the iliac marrow spaces. Maximal barrier sterile technique utilized including caps, mask, sterile gowns, sterile gloves, large sterile drape, hand hygiene, and Betadine prep. Under sterile conditions and local anesthesia, an 11 gauge coaxial bone biopsy needle was advanced into the right iliac marrow space. Needle position was confirmed with CT imaging. Initially, bone marrow aspiration was performed. Next, the 11 gauge outer cannula was utilized to obtain a right iliac bone marrow core biopsy. Needle was removed. Hemostasis was obtained with compression. The patient tolerated the procedure well. Samples were prepared with the cytotechnologist. No immediate complications. IMPRESSION: CT guided right iliac bone marrow aspiration and core biopsy. Electronically Signed   By: M.Jerilynn Mages Shick M.D.   On: 12/30/2016 11:19   Dg C-arm  1-60 Min-no Report  Result Date: 12/29/2016 Fluoroscopy was utilized by the requesting physician.  No radiographic interpretation.    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 GCB subtype. Positive for myc-IHC. FISH for myc/BCL-2/ bcl-6 gene rearrangements-NEGATIVE. Patient currently on  R-CHOP s/p cycle #1. Tolerating chemotherapy fairly well- except for constipation/mild neuropathy [C discussion below]  # Proceed with cycle #2 of  chemotherapy-R CHOP today. Labs today reviewed;  acceptable for treatment today.  Discussed that the plan would be a total of 3 cycles of chemotherapy followed by involved field radiation. We will make a referral to Dr. Donella Stade.  # Constipation- likely from vincristine. Continue MiraLAX/laxatives. Increased fluid intake.  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 Monitor for now.  # CKD- Stage I- monitor for now.   # follow up in 3 weeks/R-CHOP; will refer to Dr.Crystal.    All questions were answered. The patient knows to call the clinic with any problems, questions or concerns.    Cammie Sickle, MD 01/24/2017 12:36 PM

## 2017-01-24 NOTE — Assessment & Plan Note (Addendum)
Diffuse large B-cell lymphoma- stage I based on PET scan [right axillary adenopathy only]; bone marrow biopsy-NEGATIVE. GCB subtype. Positive for myc-IHC. FISH for myc/BCL-2/ bcl-6 gene rearrangements-NEGATIVE. Patient currently on  R-CHOP s/p cycle #1. Tolerating chemotherapy fairly well- except for constipation/mild neuropathy [C discussion below]  # Proceed with cycle #2 of chemotherapy-R CHOP today. Labs today reviewed;  acceptable for treatment today.  Discussed that the plan would be a total of 3 cycles of chemotherapy followed by involved field radiation. We will make a referral to Dr. Donella Stade.  # Constipation- likely from vincristine. Continue MiraLAX/laxatives. Increased fluid intake.  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 Monitor for now.  # CKD- Stage I- monitor for now.   # follow up in 3 weeks/R-CHOP; will refer to Dr.Crystal.

## 2017-01-26 ENCOUNTER — Encounter: Payer: Self-pay | Admitting: Radiation Oncology

## 2017-01-26 ENCOUNTER — Ambulatory Visit
Admission: RE | Admit: 2017-01-26 | Discharge: 2017-01-26 | Disposition: A | Discharge status: Home / Self Care | Payer: Medicare Other | Source: Ambulatory Visit | Attending: Radiation Oncology | Admitting: Radiation Oncology

## 2017-01-26 VITALS — BP 126/81 | HR 83 | Temp 97.7°F | Resp 20 | Wt 222.4 lb

## 2017-01-26 DIAGNOSIS — I10 Essential (primary) hypertension: Secondary | ICD-10-CM | POA: Insufficient documentation

## 2017-01-26 DIAGNOSIS — E785 Hyperlipidemia, unspecified: Secondary | ICD-10-CM | POA: Insufficient documentation

## 2017-01-26 DIAGNOSIS — J449 Chronic obstructive pulmonary disease, unspecified: Secondary | ICD-10-CM | POA: Insufficient documentation

## 2017-01-26 DIAGNOSIS — M129 Arthropathy, unspecified: Secondary | ICD-10-CM | POA: Insufficient documentation

## 2017-01-26 DIAGNOSIS — K219 Gastro-esophageal reflux disease without esophagitis: Secondary | ICD-10-CM | POA: Insufficient documentation

## 2017-01-26 DIAGNOSIS — C8334 Diffuse large B-cell lymphoma, lymph nodes of axilla and upper limb: Secondary | ICD-10-CM | POA: Insufficient documentation

## 2017-01-26 DIAGNOSIS — Z79899 Other long term (current) drug therapy: Secondary | ICD-10-CM | POA: Diagnosis not present

## 2017-01-26 DIAGNOSIS — Z7982 Long term (current) use of aspirin: Secondary | ICD-10-CM | POA: Insufficient documentation

## 2017-01-26 NOTE — Consult Note (Signed)
NEW PATIENT EVALUATION  Name: Henry [name]  MRN: [id number]  Date:   01/26/2017     DOB: [birth year]   This 71 y.o. male patient presents to the clinic for initial evaluation of stage I a diffuse large B-cell lymphoma CD20 positive undergoing R CHOP chemotherapy for consideration of involved field treatment.  REFERRING PHYSICIAN: Jerrol Banana.,*  CHIEF COMPLAINT:  Chief Complaint  Patient presents with  . Cancer    Pt is here for initial consultation of lymphoma    DIAGNOSIS: The encounter diagnosis was Diffuse large B-cell lymphoma of lymph nodes of axilla ([place]).   PREVIOUS INVESTIGATIONS:  PET scan is reviewed Pathology reports reviewed Clinical notes reviewed  HPI: Patient is a 71 year old male who presented with a sudden discovery of a left axillary lymph node. He underwent biopsy showing diffuse large B-cell lymphoma from core biopsy CD20 positive. PET CT scan demonstrated hypermetabolic activity in the left axillary lymph node with no other evidence of hypermetabolic disease. Patient is completed 2 cycles of R CHOP chemotherapy with was complete resolution of left axillary mass. The original axillary lymph node measured 4.4 x 2.5 cm. Patient has no B symptoms no fever chills night sweats. He is doing well tolerating his chemotherapy. I been asked to evaluate him for possible involved field treatment.  PLANNED TREATMENT REGIMEN: Involved field radiation therapy  PAST MEDICAL HISTORY:  has a past medical history of Allergy; Arthritis; Cancer ([place]); Cataract; GERD (gastroesophageal reflux disease); Hyperlipidemia; and Hypertension.    PAST SURGICAL HISTORY:  Past Surgical History:  Procedure Laterality Date  . CATARACT EXTRACTION Bilateral   . MOLE REMOVAL    . NECK MASS EXCISION    . PORTACATH PLACEMENT N/A 12/29/2016   Procedure: INSERTION PORT-A-CATH;  Surgeon: Christene Lye, MD;  Location: [facility] ORS;  Service: General;  Laterality: N/A;  . TONSILLECTOMY       FAMILY HISTORY: family history includes CAD in his mother; COPD in his sister; Heart attack in his father; Heart disease in his mother; Rheumatic fever in his mother.  SOCIAL HISTORY:  reports that he has never smoked. He has never used smokeless tobacco. He reports that he drinks alcohol. He reports that he does not use drugs.  ALLERGIES: Lovastatin; Pravastatin; Pravastatin sodium; Simvastatin; Statins; and Sulfa antibiotics  MEDICATIONS:  Current Outpatient Prescriptions  Medication Sig Dispense Refill  . amLODipine (NORVASC) 5 MG tablet Take 1 tablet (5 mg total) by mouth daily. 90 tablet 3  . aspirin EC 81 MG tablet Take 81 mg by mouth daily.    . cetirizine (ZYRTEC) 10 MG tablet Take 10 mg by mouth daily.    . cholecalciferol (VITAMIN D) 1000 UNITS tablet Take 1,000 Units by mouth daily.     Marland Kitchen docusate sodium (COLACE) 100 MG capsule Take 100 mg by mouth daily.    Marland Kitchen ibuprofen (ADVIL,MOTRIN) 200 MG tablet Take 800 mg by mouth every 8 (eight) hours as needed (for pain).    Marland Kitchen lidocaine-prilocaine (EMLA) cream Apply 1 application topically as needed. To port a cath 30 g 3  . lisinopril (PRINIVIL,ZESTRIL) 40 MG tablet TAKE 1 TABLET (40 MG TOTAL) BY MOUTH DAILY. 90 tablet 2  . omeprazole (PRILOSEC) 20 MG capsule TAKE 1 CAPSULE (20 MG TOTAL) BY MOUTH DAILY. 90 capsule 3  . ondansetron (ZOFRAN) 8 MG tablet Take 1 tablet (8 mg total) by mouth every 8 (eight) hours as needed for nausea or vomiting. 20 tablet 3  . phenylephrine-shark liver oil-mineral oil-petrolatum (  PREPARATION H) 0.25-14-74.9 % rectal ointment Place 1 application rectally 2 (two) times daily as needed for hemorrhoids.    . predniSONE (DELTASONE) 50 MG tablet Take 2 tablets once day x 5 days. START on day of your chemotherapy. Take with food. 10 tablet 4  . prochlorperazine (COMPAZINE) 10 MG tablet Take 1 tablet (10 mg total) by mouth every 6 (six) hours as needed for nausea or vomiting. 30 tablet 3  . sildenafil (REVATIO)  20 MG tablet UP TO 5 TABLETS DAILY AS NEEDED (Patient not taking: Reported on 01/26/2017) 50 tablet 11  . triamcinolone cream (KENALOG) 0.1 % Apply 1 application topically 2 (two) times daily. To affected area. Only use for a week at a time. (Patient not taking: Reported on 01/24/2017) 30 g 0   No current facility-administered medications for this encounter.     ECOG PERFORMANCE STATUS:  0 - Asymptomatic  REVIEW OF SYSTEMS:  Patient denies any weight loss, fatigue, weakness, fever, chills or night sweats. Patient denies any loss of vision, blurred vision. Patient denies any ringing  of the ears or hearing loss. No irregular heartbeat. Patient denies heart murmur or history of fainting. Patient denies any chest pain or pain radiating to her upper extremities. Patient denies any shortness of breath, difficulty breathing at night, cough or hemoptysis. Patient denies any swelling in the lower legs. Patient denies any nausea vomiting, vomiting of blood, or coffee ground material in the vomitus. Patient denies any stomach pain. Patient states has had normal bowel movements no significant constipation or diarrhea. Patient denies any dysuria, hematuria or significant nocturia. Patient denies any problems walking, swelling in the joints or loss of balance. Patient denies any skin changes, loss of hair or loss of weight. Patient denies any excessive worrying or anxiety or significant depression. Patient denies any problems with insomnia. Patient denies excessive thirst, polyuria, polydipsia. Patient denies any swollen glands, patient denies easy bruising or easy bleeding. Patient denies any recent infections, allergies or URI. Patient "s visual fields have not changed significantly in recent time.    PHYSICAL EXAM: BP 126/81   Pulse 83   Temp 97.7 F (36.5 C)   Resp 20   Wt 222 lb 7.1 oz (100.9 kg)   BMI 29.35 kg/m  Really can only discern slight thickening in his left axilla no discernible noted this time.  No other peripheral adenopathy is identified. Well-developed well-nourished patient in NAD. HEENT reveals PERLA, EOMI, discs not visualized.  Oral cavity is clear. No oral mucosal lesions are identified. Neck is clear without evidence of cervical or supraclavicular adenopathy. Lungs are clear to A&P. Cardiac examination is essentially unremarkable with regular rate and rhythm without murmur rub or thrill. Abdomen is benign with no organomegaly or masses noted. Motor sensory and DTR levels are equal and symmetric in the upper and lower extremities. Cranial nerves II through XII are grossly intact. Proprioception is intact. No peripheral adenopathy or edema is identified. No motor or sensory levels are noted. Crude visual fields are within normal range.  LABORATORY DATA: Pathology reports reviewed    RADIOLOGY RESULTS: PET CT scan reviewed   IMPRESSION: Stage I a diffuse large B-cell lymphoma in 71 year old male status post R CHOP chemotherapy.  PLAN: At this time I like to go ahead with involved field radiation therapy. Would plan on 3000 cGy in 15 fractions. Will allow completion of his third cycle of R CHOP chemotherapy. I have personally scheduled and ordered CT simulation the middle of October after  completion of his chemotherapy. Risks and benefits of treatment including skin reaction fatigue slight possibility of lymphedema his left upper extremity all were discussed in detail with the patient. He comprehends my treatment plan well.There will be extra effort by both professional staff as well as technical staff to coordinate and manage concurrent chemoradiation and ensuing side effects during his treatments. Patient is to call with any concerns prior to initiating treatment.  I would like to take this opportunity to thank you for allowing me to participate in the care of your patient.Armstead Peaks., MD

## 2017-02-07 ENCOUNTER — Ambulatory Visit: Payer: Self-pay | Admitting: Family Medicine

## 2017-02-14 ENCOUNTER — Inpatient Hospital Stay: Payer: Medicare Other

## 2017-02-14 ENCOUNTER — Inpatient Hospital Stay: Payer: Medicare Other | Attending: Internal Medicine | Admitting: Internal Medicine

## 2017-02-14 VITALS — BP 139/82 | HR 76 | Temp 95.2°F | Resp 18 | Wt 218.4 lb

## 2017-02-14 DIAGNOSIS — Z7952 Long term (current) use of systemic steroids: Secondary | ICD-10-CM | POA: Insufficient documentation

## 2017-02-14 DIAGNOSIS — R5383 Other fatigue: Secondary | ICD-10-CM | POA: Diagnosis not present

## 2017-02-14 DIAGNOSIS — E785 Hyperlipidemia, unspecified: Secondary | ICD-10-CM

## 2017-02-14 DIAGNOSIS — Z79899 Other long term (current) drug therapy: Secondary | ICD-10-CM | POA: Diagnosis not present

## 2017-02-14 DIAGNOSIS — T451X5S Adverse effect of antineoplastic and immunosuppressive drugs, sequela: Secondary | ICD-10-CM | POA: Insufficient documentation

## 2017-02-14 DIAGNOSIS — K5900 Constipation, unspecified: Secondary | ICD-10-CM | POA: Diagnosis not present

## 2017-02-14 DIAGNOSIS — Z23 Encounter for immunization: Secondary | ICD-10-CM | POA: Diagnosis not present

## 2017-02-14 DIAGNOSIS — I129 Hypertensive chronic kidney disease with stage 1 through stage 4 chronic kidney disease, or unspecified chronic kidney disease: Secondary | ICD-10-CM | POA: Insufficient documentation

## 2017-02-14 DIAGNOSIS — G620 Drug-induced polyneuropathy: Secondary | ICD-10-CM | POA: Insufficient documentation

## 2017-02-14 DIAGNOSIS — L658 Other specified nonscarring hair loss: Secondary | ICD-10-CM | POA: Diagnosis not present

## 2017-02-14 DIAGNOSIS — Z7982 Long term (current) use of aspirin: Secondary | ICD-10-CM | POA: Insufficient documentation

## 2017-02-14 DIAGNOSIS — N181 Chronic kidney disease, stage 1: Secondary | ICD-10-CM | POA: Insufficient documentation

## 2017-02-14 DIAGNOSIS — K219 Gastro-esophageal reflux disease without esophagitis: Secondary | ICD-10-CM | POA: Diagnosis not present

## 2017-02-14 DIAGNOSIS — C8334 Diffuse large B-cell lymphoma, lymph nodes of axilla and upper limb: Secondary | ICD-10-CM

## 2017-02-14 DIAGNOSIS — Z5112 Encounter for antineoplastic immunotherapy: Secondary | ICD-10-CM | POA: Insufficient documentation

## 2017-02-14 DIAGNOSIS — M1990 Unspecified osteoarthritis, unspecified site: Secondary | ICD-10-CM | POA: Diagnosis not present

## 2017-02-14 DIAGNOSIS — Z5111 Encounter for antineoplastic chemotherapy: Secondary | ICD-10-CM | POA: Diagnosis not present

## 2017-02-14 LAB — COMPREHENSIVE METABOLIC PANEL
ALT: 18 U/L (ref 17–63)
AST: 24 U/L (ref 15–41)
Albumin: 3.8 g/dL (ref 3.5–5.0)
Alkaline Phosphatase: 68 U/L (ref 38–126)
Anion gap: 7 (ref 5–15)
BUN: 18 mg/dL (ref 6–20)
CHLORIDE: 111 mmol/L (ref 101–111)
CO2: 24 mmol/L (ref 22–32)
CREATININE: 1.23 mg/dL (ref 0.61–1.24)
Calcium: 9.2 mg/dL (ref 8.9–10.3)
GFR, EST NON AFRICAN AMERICAN: 59 mL/min — AB (ref >60)
Glucose, Bld: 140 mg/dL — ABNORMAL HIGH (ref 65–99)
POTASSIUM: 4 mmol/L (ref 3.5–5.1)
SODIUM: 142 mmol/L (ref 135–145)
Total Bilirubin: 0.5 mg/dL (ref 0.3–1.2)
Total Protein: 6.7 g/dL (ref 6.5–8.1)

## 2017-02-14 LAB — CBC WITH DIFFERENTIAL/PLATELET
BASOS ABS: 0.2 10*3/uL — AB (ref 0–0.1)
Basophils Relative: 4 %
EOS ABS: 0.1 10*3/uL (ref 0–0.7)
EOS PCT: 2 %
HCT: 36.3 % — ABNORMAL LOW (ref 40.0–52.0)
Hemoglobin: 13 g/dL (ref 13.0–18.0)
Lymphocytes Relative: 10 %
Lymphs Abs: 0.6 10*3/uL — ABNORMAL LOW (ref 1.0–3.6)
MCH: 32.2 pg (ref 26.0–34.0)
MCHC: 35.7 g/dL (ref 32.0–36.0)
MCV: 90 fL (ref 80.0–100.0)
MONO ABS: 0.4 10*3/uL (ref 0.2–1.0)
Monocytes Relative: 7 %
Neutro Abs: 4.4 10*3/uL (ref 1.4–6.5)
Neutrophils Relative %: 77 %
PLATELETS: 224 10*3/uL (ref 150–440)
RBC: 4.03 MIL/uL — AB (ref 4.40–5.90)
RDW: 14.5 % (ref 11.5–14.5)
WBC: 5.7 10*3/uL (ref 3.8–10.6)

## 2017-02-14 LAB — LACTATE DEHYDROGENASE: LDH: 117 U/L (ref 98–192)

## 2017-02-14 MED ORDER — ACETAMINOPHEN 325 MG PO TABS
650.0000 mg | ORAL_TABLET | Freq: Once | ORAL | Status: AC
Start: 2017-02-14 — End: 2017-02-14
  Administered 2017-02-14: 650 mg via ORAL
  Filled 2017-02-14: qty 2

## 2017-02-14 MED ORDER — DOXORUBICIN HCL CHEMO IV INJECTION 2 MG/ML
50.0000 mg/m2 | Freq: Once | INTRAVENOUS | Status: AC
  Administered 2017-02-14: 114 mg via INTRAVENOUS
  Filled 2017-02-14: qty 57

## 2017-02-14 MED ORDER — SODIUM CHLORIDE 0.9 % IV SOLN
900.0000 mg | Freq: Once | INTRAVENOUS | Status: AC
  Administered 2017-02-14: 900 mg via INTRAVENOUS
  Filled 2017-02-14: qty 50

## 2017-02-14 MED ORDER — DIPHENHYDRAMINE HCL 25 MG PO CAPS
50.0000 mg | ORAL_CAPSULE | Freq: Once | ORAL | Status: AC
  Administered 2017-02-14: 50 mg via ORAL
  Filled 2017-02-14: qty 2

## 2017-02-14 MED ORDER — DEXAMETHASONE SODIUM PHOSPHATE 10 MG/ML IJ SOLN
10.0000 mg | Freq: Once | INTRAMUSCULAR | Status: AC
  Administered 2017-02-14: 10 mg via INTRAVENOUS
  Filled 2017-02-14: qty 1

## 2017-02-14 MED ORDER — VINCRISTINE SULFATE CHEMO INJECTION 1 MG/ML
2.0000 mg | Freq: Once | INTRAVENOUS | Status: AC
  Administered 2017-02-14: 2 mg via INTRAVENOUS
  Filled 2017-02-14: qty 2

## 2017-02-14 MED ORDER — SODIUM CHLORIDE 0.9 % IV SOLN: 375.0000 mg/m2 | Freq: Once | INTRAVENOUS | Status: DC

## 2017-02-14 MED ORDER — SODIUM CHLORIDE 0.9 % IV SOLN
1700.0000 mg | Freq: Once | INTRAVENOUS | Status: AC
  Administered 2017-02-14: 1700 mg via INTRAVENOUS
  Filled 2017-02-14: qty 85

## 2017-02-14 MED ORDER — HEPARIN SOD (PORK) LOCK FLUSH 100 UNIT/ML IV SOLN
500.0000 [IU] | Freq: Once | INTRAVENOUS | Status: AC | PRN
  Administered 2017-02-14: 500 [IU]
  Filled 2017-02-14: qty 5

## 2017-02-14 MED ORDER — SODIUM CHLORIDE 0.9 % IV SOLN
Freq: Once | INTRAVENOUS | Status: AC
Start: 2017-02-14 — End: 2017-02-14
  Administered 2017-02-14: 10:00:00 via INTRAVENOUS
  Filled 2017-02-14: qty 1000

## 2017-02-14 MED ORDER — PALONOSETRON HCL INJECTION 0.25 MG/5ML
0.2500 mg | Freq: Once | INTRAVENOUS | Status: AC
  Administered 2017-02-14: 0.25 mg via INTRAVENOUS
  Filled 2017-02-14: qty 5

## 2017-02-14 MED ORDER — PEGFILGRASTIM 6 MG/0.6ML ~~LOC~~ PSKT
6.0000 mg | PREFILLED_SYRINGE | Freq: Once | SUBCUTANEOUS | Status: AC
  Administered 2017-02-14: 6 mg via SUBCUTANEOUS
  Filled 2017-02-14: qty 0.6

## 2017-02-14 NOTE — Progress Notes (Signed)
Here for follow up. Doing well.  

## 2017-02-14 NOTE — Progress Notes (Signed)
[place] NOTE  Patient Care Team: Jerrol Banana., MD as PCP - General (Family Medicine) Carmon Ginsberg, [place] as Referring Physician (Family Medicine) Christene Lye, MD (General Surgery)  CHIEF COMPLAINTS/PURPOSE OF CONSULTATION:  Lymphoma  #  Oncology History   # AUG 6th 2018- DLBCL STAGE I [LEFT axilla- Core Bx- Dr.Sankar; CD-20, bcl-2;bcl-6 positive]; Ki- 67 >90%. MUM-1 NEG. GCB subtype; Myc- POS; FISH studies-NEGATIVE for gene RE-ARRANGEMENTS [positive for extra-copies of bcl-6/ myc/IgH/bcl-2]PET scan- Right Ax LN [4.5cm; SUV 35]  # R-CHOP [aug 20th 2018]   # AUG 13th 2018. MUGA scan-56%.      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  male with diffuse large B-cell lymphoma stage I currently status post cycle #2 of R- CHOP chemotherapy.  In the interim he has been evaluated by radiation oncology.  Patient admits to improvement of his left axillary mass. He continues to complain of  mild tingling and numbness in his hands and feet from the chemotherapy. Positive for hair loss.  He denies any nausea vomiting. Denies any chills. No night sweats. Admits to constipation-on MiraLAX.   ROS: A complete 10 point review of system is done which is negative except mentioned above in history of present illness  MEDICAL HISTORY:  Past Medical History:  Diagnosis Date  . Allergy   . Arthritis   . Cancer ([place])    left axillary  . Cataract   . GERD (gastroesophageal reflux disease)   . Hyperlipidemia   . Hypertension     SURGICAL HISTORY: Past Surgical History:  Procedure Laterality Date  . CATARACT EXTRACTION Bilateral   . MOLE REMOVAL    . NECK MASS EXCISION    . PORTACATH PLACEMENT N/A 12/29/2016   Procedure: INSERTION PORT-A-CATH;  Surgeon: Christene Lye, MD;  Location: [facility] ORS;  Service: General;  Laterality: N/A;  . TONSILLECTOMY       SOCIAL HISTORY:lives in [place], never smoked; 2 beers/ day. 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  Social History   Social History  . Marital status: Married    Spouse name: N/A  . Number of children: N/A  . Years of education: N/A   Occupational History  . Not on file.   Social History Main Topics  . Smoking status: Never Smoker  . Smokeless tobacco: Never Used  . Alcohol use 0.0 oz/week     Comment: 2 beers 4-5 times per week  . Drug use: No  . Sexual activity: Not on file   Other Topics Concern  . Not on file   Social History Narrative  . No narrative on file    FAMILY HISTORY: Family History  Problem Relation Age of Onset  . Heart disease Mother   . Rheumatic fever Mother   . CAD Mother   . Heart attack Father   . COPD Sister     ALLERGIES:  is allergic to lovastatin; pravastatin; pravastatin sodium; simvastatin; statins; and sulfa antibiotics.  MEDICATIONS:  Current Outpatient Prescriptions  Medication Sig Dispense Refill  . amLODipine (NORVASC) 5 MG tablet Take 1 tablet (5 mg total) by mouth daily. 90 tablet 3  . aspirin EC 81 MG tablet Take 81 mg by mouth daily.    . cetirizine (ZYRTEC) 10 MG tablet Take 10 mg by mouth daily.    . cholecalciferol (VITAMIN D) 1000 UNITS tablet Take 1,000 Units by  mouth daily.     Marland Kitchen docusate sodium (COLACE) 100 MG capsule Take 100 mg by mouth daily.    Marland Kitchen lidocaine-prilocaine (EMLA) cream Apply 1 application topically as needed. To port a cath 30 g 3  . lisinopril (PRINIVIL,ZESTRIL) 40 MG tablet TAKE 1 TABLET (40 MG TOTAL) BY MOUTH DAILY. 90 tablet 2  . omeprazole (PRILOSEC) 20 MG capsule TAKE 1 CAPSULE (20 MG TOTAL) BY MOUTH DAILY. 90 capsule 3  . predniSONE (DELTASONE) 50 MG tablet Take 2 tablets once day x 5 days. START on day of your chemotherapy. Take with food. 10 tablet 4  . ibuprofen (ADVIL,MOTRIN) 200 MG tablet Take 800 mg by mouth every 8 (eight) hours as needed (for pain).    . ondansetron  (ZOFRAN) 8 MG tablet Take 1 tablet (8 mg total) by mouth every 8 (eight) hours as needed for nausea or vomiting. (Patient not taking: Reported on 02/14/2017) 20 tablet 3  . phenylephrine-shark liver oil-mineral oil-petrolatum (PREPARATION H) 0.25-14-74.9 % rectal ointment Place 1 application rectally 2 (two) times daily as needed for hemorrhoids.    . prochlorperazine (COMPAZINE) 10 MG tablet Take 1 tablet (10 mg total) by mouth every 6 (six) hours as needed for nausea or vomiting. (Patient not taking: Reported on 02/14/2017) 30 tablet 3  . sildenafil (REVATIO) 20 MG tablet UP TO 5 TABLETS DAILY AS NEEDED (Patient not taking: Reported on 01/26/2017) 50 tablet 11  . triamcinolone cream (KENALOG) 0.1 % Apply 1 application topically 2 (two) times daily. To affected area. Only use for a week at a time. (Patient not taking: Reported on 01/24/2017) 30 g 0   No current facility-administered medications for this visit.       Marland Kitchen  PHYSICAL EXAMINATION: ECOG PERFORMANCE STATUS: 1 - Symptomatic but completely ambulatory  Vitals:   02/14/17 0848  BP: 139/82  Pulse: 76  Resp: 18  Temp: (!) 95.2 F (35.1 C)   Filed Weights   02/14/17 0848  Weight: 218 lb 6.4 oz (99.1 kg)    GENERAL: Well-nourished well-developed; Alert, no distress and comfortable. He is alone. EYES: no pallor or icterus OROPHARYNX: no thrush or ulceration; good dentition  NECK: supple, no masses felt LYMPH:  no palpable lymphadenopathy in the cervical,  or inguinal regions. Soft vague/palpable mass noted in the left axilla ~ 1 cm in size [improved] LUNGS: clear to auscultation and  No wheeze or crackles HEART/CVS: regular rate & rhythm and no murmurs; No lower extremity edema ABDOMEN: abdomen soft, non-tender and normal bowel sounds Musculoskeletal:no cyanosis of digits and no clubbing  PSYCH: alert & oriented x 3 with fluent speech NEURO: no focal motor/sensory deficits SKIN:  no rashes or significant lesions  LABORATORY DATA:   I have reviewed the data as listed Lab Results  Component Value Date   WBC 5.7 02/14/2017   HGB 13.0 02/14/2017   HCT 36.3 (L) 02/14/2017   MCV 90.0 02/14/2017   PLT 224 02/14/2017    Recent Labs  01/10/17 1018 01/24/17 0918 02/14/17 0830  NA 136 138 142  K 3.8 4.0 4.0  CL 102 107 111  CO2 _0 GLUCOSE 106* 97 140*  BUN _1 CREATININE 1.24 1.37* 1.23  CALCIUM 8.8* 9.4 9.2  GFRNONAA 58* 51* 59*  GFRAA >60 60* >60  PROT 6.4* 7.0 6.7  ALBUMIN 3.6 3.9 3.8  AST _2 ALT 32 17 18  ALKPHOS 77 72 68  BILITOT 0.9 0.6 0.5  RADIOGRAPHIC STUDIES: I have personally reviewed the radiological images as listed and agreed with the findings in the report. No results found.  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 Patient currently on  R-CHOP s/p cycle #2. Tolerating chemotherapy fairly well- except for constipation/mild neuropathy/Fatigue [C discussion below]. Clinical response noted.  # Proceed with cycle #3  of chemotherapy-R CHOP today. Labs today reviewed;  acceptable for treatment today.  Plan RT after chemo # 3. ; sim planned on 10/17. Reviewed Dr.Crystal's recommendations.   # Constipation- likely from vincristine- improved. Continue MiraLAX/laxatives. Increased fluid intake.  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 Monitor for now.  # CKD- Stage I-creat 1.23 today. Monitor for now.   # Fatigue- G-1- monitor for now.  # Flu shot at next visit.   # follow up in 3 weeks/labs.No chemo.    All questions were answered. The patient knows to call the clinic with any problems, questions or concerns.    Govinda R Brahmanday, MD 02/14/2017 5:44 PM 

## 2017-02-14 NOTE — Assessment & Plan Note (Addendum)
Diffuse large B-cell lymphoma- stage I. Patient currently on  R-CHOP s/p cycle #2. Tolerating chemotherapy fairly well- except for constipation/mild neuropathy/Fatigue [C discussion below]. Clinical response noted.  # Proceed with cycle #3  of chemotherapy-R CHOP today. Labs today reviewed;  acceptable for treatment today.  Plan RT after chemo # 3. ; sim planned on 10/17. Reviewed Dr.Crystal's recommendations.   # Constipation- likely from vincristine- improved. Continue MiraLAX/laxatives. Increased fluid intake.  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 Monitor for now.  # CKD- Stage I-creat 1.23 today. Monitor for now.   # Fatigue- G-1- monitor for now.  # Flu shot at next visit.   # follow up in 3 weeks/labs.No chemo.

## 2017-02-15 ENCOUNTER — Other Ambulatory Visit: Payer: Self-pay | Admitting: Internal Medicine

## 2017-02-15 ENCOUNTER — Telehealth: Payer: Self-pay | Admitting: *Deleted

## 2017-02-15 DIAGNOSIS — C8334 Diffuse large B-cell lymphoma, lymph nodes of axilla and upper limb: Secondary | ICD-10-CM

## 2017-02-15 MED ORDER — PEGFILGRASTIM INJECTION 6 MG/0.6ML ~~LOC~~: 6.0000 mg | PREFILLED_SYRINGE | Freq: Once | SUBCUTANEOUS | Status: DC

## 2017-02-15 NOTE — Telephone Encounter (Signed)
This has been taken care of by Dr. Rogue Bussing

## 2017-02-15 NOTE — Telephone Encounter (Signed)
This is Dr. Agnes Lawrence patient. Thanks

## 2017-02-15 NOTE — Telephone Encounter (Addendum)
Patient called and pulled off his On Pro at 1045 thinking it was to have injected at 7 this morning and  Realized it had not injected yet, I spoke with Liam Graham, Charge RN in infusion and she spoke with Sinclair Grooms, RN who stated it was not to infuse until 7 tonight and that patient needs to come in tomorrow for a neulasta injection. Please enter an order for neulasta injection tomorrow he is coming at 9 AM tomorrow

## 2017-02-16 ENCOUNTER — Inpatient Hospital Stay: Payer: Medicare Other

## 2017-02-16 DIAGNOSIS — C8334 Diffuse large B-cell lymphoma, lymph nodes of axilla and upper limb: Secondary | ICD-10-CM

## 2017-02-16 MED ORDER — PEGFILGRASTIM INJECTION 6 MG/0.6ML ~~LOC~~
6.0000 mg | PREFILLED_SYRINGE | Freq: Once | SUBCUTANEOUS | Status: AC
  Administered 2017-02-16: 6 mg via SUBCUTANEOUS
  Filled 2017-02-16: qty 0.6

## 2017-03-02 ENCOUNTER — Ambulatory Visit
Admission: RE | Admit: 2017-03-02 | Discharge: 2017-03-02 | Disposition: A | Discharge status: Home / Self Care | Payer: Medicare Other | Source: Ambulatory Visit | Attending: Radiation Oncology | Admitting: Radiation Oncology

## 2017-03-02 DIAGNOSIS — C8334 Diffuse large B-cell lymphoma, lymph nodes of axilla and upper limb: Secondary | ICD-10-CM | POA: Diagnosis not present

## 2017-03-04 DIAGNOSIS — C8334 Diffuse large B-cell lymphoma, lymph nodes of axilla and upper limb: Secondary | ICD-10-CM | POA: Diagnosis not present

## 2017-03-08 ENCOUNTER — Ambulatory Visit: Admission: RE | Admit: 2017-03-08 | Payer: Medicare Other | Source: Ambulatory Visit

## 2017-03-09 ENCOUNTER — Inpatient Hospital Stay: Payer: Medicare Other

## 2017-03-09 ENCOUNTER — Other Ambulatory Visit: Payer: Self-pay | Admitting: Family Medicine

## 2017-03-09 ENCOUNTER — Ambulatory Visit
Admission: RE | Admit: 2017-03-09 | Discharge: 2017-03-09 | Disposition: A | Discharge status: Home / Self Care | Payer: Medicare Other | Source: Ambulatory Visit | Attending: Radiation Oncology | Admitting: Radiation Oncology

## 2017-03-09 ENCOUNTER — Inpatient Hospital Stay (HOSPITAL_BASED_OUTPATIENT_CLINIC_OR_DEPARTMENT_OTHER): Payer: Medicare Other | Admitting: Internal Medicine

## 2017-03-09 ENCOUNTER — Ambulatory Visit: Payer: Medicare Other

## 2017-03-09 VITALS — BP 138/89 | HR 87 | Temp 98.1°F | Resp 14 | Wt 221.8 lb

## 2017-03-09 DIAGNOSIS — I129 Hypertensive chronic kidney disease with stage 1 through stage 4 chronic kidney disease, or unspecified chronic kidney disease: Secondary | ICD-10-CM

## 2017-03-09 DIAGNOSIS — K219 Gastro-esophageal reflux disease without esophagitis: Secondary | ICD-10-CM

## 2017-03-09 DIAGNOSIS — Z79899 Other long term (current) drug therapy: Secondary | ICD-10-CM

## 2017-03-09 DIAGNOSIS — R5383 Other fatigue: Secondary | ICD-10-CM

## 2017-03-09 DIAGNOSIS — Z7982 Long term (current) use of aspirin: Secondary | ICD-10-CM

## 2017-03-09 DIAGNOSIS — L658 Other specified nonscarring hair loss: Secondary | ICD-10-CM

## 2017-03-09 DIAGNOSIS — Z7952 Long term (current) use of systemic steroids: Secondary | ICD-10-CM | POA: Diagnosis not present

## 2017-03-09 DIAGNOSIS — G620 Drug-induced polyneuropathy: Secondary | ICD-10-CM

## 2017-03-09 DIAGNOSIS — M1990 Unspecified osteoarthritis, unspecified site: Secondary | ICD-10-CM

## 2017-03-09 DIAGNOSIS — N181 Chronic kidney disease, stage 1: Secondary | ICD-10-CM

## 2017-03-09 DIAGNOSIS — C8334 Diffuse large B-cell lymphoma, lymph nodes of axilla and upper limb: Secondary | ICD-10-CM

## 2017-03-09 DIAGNOSIS — E785 Hyperlipidemia, unspecified: Secondary | ICD-10-CM

## 2017-03-09 DIAGNOSIS — T451X5S Adverse effect of antineoplastic and immunosuppressive drugs, sequela: Secondary | ICD-10-CM

## 2017-03-09 DIAGNOSIS — Z23 Encounter for immunization: Secondary | ICD-10-CM

## 2017-03-09 DIAGNOSIS — K5900 Constipation, unspecified: Secondary | ICD-10-CM

## 2017-03-09 DIAGNOSIS — I10 Essential (primary) hypertension: Secondary | ICD-10-CM

## 2017-03-09 LAB — COMPREHENSIVE METABOLIC PANEL
ALT: 16 U/L — AB (ref 17–63)
AST: 18 U/L (ref 15–41)
Albumin: 3.8 g/dL (ref 3.5–5.0)
Alkaline Phosphatase: 67 U/L (ref 38–126)
Anion gap: 7 (ref 5–15)
BUN: 19 mg/dL (ref 6–20)
CHLORIDE: 108 mmol/L (ref 101–111)
CO2: 25 mmol/L (ref 22–32)
CREATININE: 1.29 mg/dL — AB (ref 0.61–1.24)
Calcium: 9.1 mg/dL (ref 8.9–10.3)
GFR calc Af Amer: >60 mL/min (ref >60)
GFR, EST NON AFRICAN AMERICAN: 55 mL/min — AB (ref >60)
Glucose, Bld: 104 mg/dL — ABNORMAL HIGH (ref 65–99)
POTASSIUM: 4.2 mmol/L (ref 3.5–5.1)
SODIUM: 140 mmol/L (ref 135–145)
Total Bilirubin: 0.6 mg/dL (ref 0.3–1.2)
Total Protein: 6.8 g/dL (ref 6.5–8.1)

## 2017-03-09 LAB — CBC WITH DIFFERENTIAL/PLATELET
BASOS ABS: 0 10*3/uL (ref 0–0.1)
BASOS PCT: 1 %
EOS ABS: 0.4 10*3/uL (ref 0–0.7)
EOS PCT: 5 %
HCT: 36 % — ABNORMAL LOW (ref 40.0–52.0)
Hemoglobin: 12.6 g/dL — ABNORMAL LOW (ref 13.0–18.0)
Lymphocytes Relative: 8 %
Lymphs Abs: 0.7 10*3/uL — ABNORMAL LOW (ref 1.0–3.6)
MCH: 32.3 pg (ref 26.0–34.0)
MCHC: 34.9 g/dL (ref 32.0–36.0)
MCV: 92.5 fL (ref 80.0–100.0)
MONO ABS: 1 10*3/uL (ref 0.2–1.0)
Monocytes Relative: 12 %
Neutro Abs: 5.9 10*3/uL (ref 1.4–6.5)
Neutrophils Relative %: 74 %
PLATELETS: 261 10*3/uL (ref 150–440)
RBC: 3.89 MIL/uL — AB (ref 4.40–5.90)
RDW: 15.3 % — AB (ref 11.5–14.5)
WBC: 7.9 10*3/uL (ref 3.8–10.6)

## 2017-03-09 LAB — LACTATE DEHYDROGENASE: LDH: 140 U/L (ref 98–192)

## 2017-03-09 MED ORDER — INFLUENZA VAC SPLIT QUAD 0.5 ML IM SUSY
0.5000 mL | PREFILLED_SYRINGE | Freq: Once | INTRAMUSCULAR | Status: AC
  Administered 2017-03-09: 0.5 mL via INTRAMUSCULAR
  Filled 2017-03-09: qty 0.5

## 2017-03-09 NOTE — Progress Notes (Signed)
[place] NOTE  Patient Care Team: Jerrol Banana., MD as PCP - General (Family Medicine) Carmon Ginsberg, [place] as Referring Physician (Family Medicine) Christene Lye, MD (General Surgery)  CHIEF COMPLAINTS/PURPOSE OF CONSULTATION:  Lymphoma  #  Oncology History   # AUG 6th 2018- DLBCL STAGE I [LEFT axilla- Core Bx- Dr.Sankar; CD-20, bcl-2;bcl-6 positive]; Ki- 67 >90%. MUM-1 NEG. GCB subtype; Myc- POS; FISH studies-NEGATIVE for gene RE-ARRANGEMENTS [positive for extra-copies of bcl-6/ myc/IgH/bcl-2]PET scan- Right Ax LN [4.5cm; SUV 35]  # R-CHOP [aug 20th 2018] x3 cycles; RT [starting 10/25]   # AUG 13th 2018. MUGA scan-56%.      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  male with diffuse large B-cell lymphoma stage I currently status post cycle #3 of R- CHOP chemotherapy.  Patient admits to Continued improvement of his left axillary mass. He continues to complain of  mild tingling and numbness in his hands and feet from the chemotherapy-this is not any worse.He denies any nausea vomiting. Denies any chills. No night sweats.   He plans to start radiation tomorrow. He had simulation done today.  ROS: A complete 10 point review of system is done which is negative except mentioned above in history of present illness  MEDICAL HISTORY:  Past Medical History:  Diagnosis Date  . Allergy   . Arthritis   . Cancer ([place])    left axillary  . Cataract   . GERD (gastroesophageal reflux disease)   . Hyperlipidemia   . Hypertension     SURGICAL HISTORY: Past Surgical History:  Procedure Laterality Date  . CATARACT EXTRACTION Bilateral   . MOLE REMOVAL    . NECK MASS EXCISION    . PORTACATH PLACEMENT N/A 12/29/2016   Procedure: INSERTION PORT-A-CATH;  Surgeon: Christene Lye, MD;  Location: [facility] ORS;  Service: General;  Laterality: N/A;  . TONSILLECTOMY       SOCIAL HISTORY:lives in , never smoked; 2 beers/ day. 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  Social History   Social History  . Marital status: Married    Spouse name: N/A  . Number of children: N/A  . Years of education: N/A   Occupational History  . Not on file.   Social History Main Topics  . Smoking status: Never Smoker  . Smokeless tobacco: Never Used  . Alcohol use 0.0 oz/week     Comment: 2 beers 4-5 times per week  . Drug use: No  . Sexual activity: Not on file   Other Topics Concern  . Not on file   Social History Narrative  . No narrative on file    FAMILY HISTORY: Family History  Problem Relation Age of Onset  . Heart disease Mother   . Rheumatic fever Mother   . CAD Mother   . Heart attack Father   . COPD Sister     ALLERGIES:  is allergic to lovastatin; pravastatin; pravastatin sodium; simvastatin; statins; and sulfa antibiotics.  MEDICATIONS:  Current Outpatient Prescriptions  Medication Sig Dispense Refill  . amLODipine (NORVASC) 5 MG tablet TAKE ONE TABLET BY MOUTH DAILY 90 tablet 3  . aspirin EC 81 MG tablet Take 81 mg by mouth daily.    . cetirizine (ZYRTEC) 10 MG tablet Take 10 mg by mouth daily.    . cholecalciferol (VITAMIN D) 1000 UNITS tablet Take 1,000 Units by mouth daily.     Marland Kitchen  docusate sodium (COLACE) 100 MG capsule Take 100 mg by mouth daily.    Marland Kitchen ibuprofen (ADVIL,MOTRIN) 200 MG tablet Take 800 mg by mouth every 8 (eight) hours as needed (for pain).    Marland Kitchen lidocaine-prilocaine (EMLA) cream Apply 1 application topically as needed. To port a cath 30 g 3  . lisinopril (PRINIVIL,ZESTRIL) 40 MG tablet TAKE 1 TABLET (40 MG TOTAL) BY MOUTH DAILY. 90 tablet 2  . omeprazole (PRILOSEC) 20 MG capsule TAKE 1 CAPSULE (20 MG TOTAL) BY MOUTH DAILY. 90 capsule 3  . ondansetron (ZOFRAN) 8 MG tablet Take 1 tablet (8 mg total) by mouth every 8 (eight) hours as needed for nausea or vomiting. 20 tablet 3  . predniSONE (DELTASONE) 50  MG tablet Take 2 tablets once day x 5 days. START on day of your chemotherapy. Take with food. 10 tablet 4  . prochlorperazine (COMPAZINE) 10 MG tablet Take 1 tablet (10 mg total) by mouth every 6 (six) hours as needed for nausea or vomiting. 30 tablet 3  . sildenafil (REVATIO) 20 MG tablet UP TO 5 TABLETS DAILY AS NEEDED 50 tablet 11  . triamcinolone cream (KENALOG) 0.1 % Apply 1 application topically 2 (two) times daily. To affected area. Only use for a week at a time. 30 g 0  . phenylephrine-shark liver oil-mineral oil-petrolatum (PREPARATION H) 0.25-14-74.9 % rectal ointment Place 1 application rectally 2 (two) times daily as needed for hemorrhoids.     No current facility-administered medications for this visit.       Marland Kitchen  PHYSICAL EXAMINATION: ECOG PERFORMANCE STATUS: 1 - Symptomatic but completely ambulatory  Vitals:   03/09/17 1003  BP: 138/89  Pulse: 87  Resp: 14  Temp: 98.1 F (36.7 C)   Filed Weights   03/09/17 1003  Weight: 221 lb 12.8 oz (100.6 kg)    GENERAL: Well-nourished well-developed; Alert, no distress and comfortable. He is alone. EYES: no pallor or icterus OROPHARYNX: no thrush or ulceration; good dentition  NECK: supple, no masses felt LYMPH:  no palpable lymphadenopathy in the cervical,  or inguinal regions. Soft vague/palpable mass noted in the left axilla ~ 1 cm in size [improved] LUNGS: clear to auscultation and  No wheeze or crackles HEART/CVS: regular rate & rhythm and no murmurs; No lower extremity edema ABDOMEN: abdomen soft, non-tender and normal bowel sounds Musculoskeletal:no cyanosis of digits and no clubbing  PSYCH: alert & oriented x 3 with fluent speech NEURO: no focal motor/sensory deficits SKIN:  no rashes or significant lesions  LABORATORY DATA:  I have reviewed the data as listed Lab Results  Component Value Date   WBC 7.9 03/09/2017   HGB 12.6 (L) 03/09/2017   HCT 36.0 (L) 03/09/2017   MCV 92.5 03/09/2017   PLT 261 03/09/2017     Recent Labs  01/24/17 0918 02/14/17 0830 03/09/17 0918  NA 138 142 140  K 4.0 4.0 4.2  CL 107 111 108  CO2 25 24 25   GLUCOSE 97 140* 104*  BUN 18 18 19   CREATININE 1.37* 1.23 1.29*  CALCIUM 9.4 9.2 9.1  GFRNONAA 51* 59* 55*  GFRAA 60* >60 >60  PROT 7.0 6.7 6.8  ALBUMIN 3.9 3.8 3.8  AST 17 24 18   ALT 17 18 16*  ALKPHOS 72 68 67  BILITOT 0.6 0.5 0.6    RADIOGRAPHIC STUDIES: I have personally reviewed the radiological images as listed and agreed with the findings in the report. No results found.  ASSESSMENT & PLAN:   Diffuse large  B-cell lymphoma of lymph nodes of axilla (HCC) Diffuse large B-cell lymphoma- stage I. Patient currently on  R-CHOP s/p cycle #3. Tolerating chemotherapy fairly well- except for constipation/mild neuropathy/Fatigue [C discussion below]. Clinical response noted.  # s/p 3 cycles of R-CHOP- good clinical response; awaiting to start IFRT 10/25. We will plan to get a PET scan in approximately a month after finishing radiation mid-December  # Constipation- likely from vincristine- improved. Continue MiraLAX/laxatives. Increased fluid intake.  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 Monitor for now.  # CKD- Stage I-creat 1.29 today. Monitor for now.   # Fatigue- G-1- monitor for now.  # Flu shot today.   # follow up mid dec 2018; PET scan prior; port flush/labs/MD.   All questions were answered. The patient knows to call the clinic with any problems, questions or concerns.    Cammie Sickle, MD 03/09/2017 1:48 PM

## 2017-03-09 NOTE — Assessment & Plan Note (Addendum)
Diffuse large B-cell lymphoma- stage I. Patient currently on  R-CHOP s/p cycle #3. Tolerating chemotherapy fairly well- except for constipation/mild neuropathy/Fatigue [C discussion below]. Clinical response noted.  # s/p 3 cycles of R-CHOP- good clinical response; awaiting to start IFRT 10/25. We will plan to get a PET scan in approximately a month after finishing radiation mid-December  # Constipation- likely from vincristine- improved. Continue MiraLAX/laxatives. Increased fluid intake.  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 Monitor for now.  # CKD- Stage I-creat 1.29 today. Monitor for now.   # Fatigue- G-1- monitor for now.  # Flu shot today.   # follow up mid dec 2018; PET scan prior; port flush/labs/MD.

## 2017-03-10 ENCOUNTER — Ambulatory Visit
Admission: RE | Admit: 2017-03-10 | Discharge: 2017-03-10 | Disposition: A | Discharge status: Home / Self Care | Payer: Medicare Other | Source: Ambulatory Visit | Attending: Radiation Oncology | Admitting: Radiation Oncology

## 2017-03-10 DIAGNOSIS — C8334 Diffuse large B-cell lymphoma, lymph nodes of axilla and upper limb: Secondary | ICD-10-CM | POA: Diagnosis not present

## 2017-03-11 ENCOUNTER — Ambulatory Visit
Admission: RE | Admit: 2017-03-11 | Discharge: 2017-03-11 | Disposition: A | Discharge status: Home / Self Care | Payer: Medicare Other | Source: Ambulatory Visit | Attending: Radiation Oncology | Admitting: Radiation Oncology

## 2017-03-11 DIAGNOSIS — C8334 Diffuse large B-cell lymphoma, lymph nodes of axilla and upper limb: Secondary | ICD-10-CM | POA: Diagnosis not present

## 2017-03-14 ENCOUNTER — Ambulatory Visit
Admission: RE | Admit: 2017-03-14 | Discharge: 2017-03-14 | Disposition: A | Discharge status: Home / Self Care | Payer: Medicare Other | Source: Ambulatory Visit | Attending: Radiation Oncology | Admitting: Radiation Oncology

## 2017-03-14 DIAGNOSIS — C8334 Diffuse large B-cell lymphoma, lymph nodes of axilla and upper limb: Secondary | ICD-10-CM | POA: Diagnosis not present

## 2017-03-15 ENCOUNTER — Ambulatory Visit
Admission: RE | Admit: 2017-03-15 | Discharge: 2017-03-15 | Disposition: A | Discharge status: Home / Self Care | Payer: Medicare Other | Source: Ambulatory Visit | Attending: Radiation Oncology | Admitting: Radiation Oncology

## 2017-03-15 DIAGNOSIS — C8334 Diffuse large B-cell lymphoma, lymph nodes of axilla and upper limb: Secondary | ICD-10-CM | POA: Diagnosis not present

## 2017-03-16 ENCOUNTER — Ambulatory Visit
Admission: RE | Admit: 2017-03-16 | Discharge: 2017-03-16 | Disposition: A | Discharge status: Home / Self Care | Payer: Medicare Other | Source: Ambulatory Visit | Attending: Radiation Oncology | Admitting: Radiation Oncology

## 2017-03-16 DIAGNOSIS — C8334 Diffuse large B-cell lymphoma, lymph nodes of axilla and upper limb: Secondary | ICD-10-CM | POA: Diagnosis not present

## 2017-03-17 ENCOUNTER — Ambulatory Visit
Admission: RE | Admit: 2017-03-17 | Discharge: 2017-03-17 | Disposition: A | Discharge status: Home / Self Care | Payer: Medicare Other | Source: Ambulatory Visit | Attending: Radiation Oncology | Admitting: Radiation Oncology

## 2017-03-17 DIAGNOSIS — C8334 Diffuse large B-cell lymphoma, lymph nodes of axilla and upper limb: Secondary | ICD-10-CM | POA: Diagnosis not present

## 2017-03-18 ENCOUNTER — Ambulatory Visit
Admission: RE | Admit: 2017-03-18 | Discharge: 2017-03-18 | Disposition: A | Discharge status: Home / Self Care | Payer: Medicare Other | Source: Ambulatory Visit | Attending: Radiation Oncology | Admitting: Radiation Oncology

## 2017-03-18 DIAGNOSIS — C8334 Diffuse large B-cell lymphoma, lymph nodes of axilla and upper limb: Secondary | ICD-10-CM | POA: Diagnosis not present

## 2017-03-21 ENCOUNTER — Ambulatory Visit
Admission: RE | Admit: 2017-03-21 | Discharge: 2017-03-21 | Disposition: A | Discharge status: Home / Self Care | Payer: Medicare Other | Source: Ambulatory Visit | Attending: Radiation Oncology | Admitting: Radiation Oncology

## 2017-03-21 DIAGNOSIS — C8334 Diffuse large B-cell lymphoma, lymph nodes of axilla and upper limb: Secondary | ICD-10-CM | POA: Diagnosis not present

## 2017-03-22 ENCOUNTER — Ambulatory Visit
Admission: RE | Admit: 2017-03-22 | Discharge: 2017-03-22 | Disposition: A | Discharge status: Home / Self Care | Payer: Medicare Other | Source: Ambulatory Visit | Attending: Radiation Oncology | Admitting: Radiation Oncology

## 2017-03-22 DIAGNOSIS — C8334 Diffuse large B-cell lymphoma, lymph nodes of axilla and upper limb: Secondary | ICD-10-CM | POA: Diagnosis not present

## 2017-03-23 ENCOUNTER — Ambulatory Visit
Admission: RE | Admit: 2017-03-23 | Discharge: 2017-03-23 | Disposition: A | Discharge status: Home / Self Care | Payer: Medicare Other | Source: Ambulatory Visit | Attending: Radiation Oncology | Admitting: Radiation Oncology

## 2017-03-23 DIAGNOSIS — C8334 Diffuse large B-cell lymphoma, lymph nodes of axilla and upper limb: Secondary | ICD-10-CM | POA: Diagnosis not present

## 2017-03-24 ENCOUNTER — Ambulatory Visit
Admission: RE | Admit: 2017-03-24 | Discharge: 2017-03-24 | Disposition: A | Discharge status: Home / Self Care | Payer: Medicare Other | Source: Ambulatory Visit | Attending: Radiation Oncology | Admitting: Radiation Oncology

## 2017-03-24 DIAGNOSIS — C8334 Diffuse large B-cell lymphoma, lymph nodes of axilla and upper limb: Secondary | ICD-10-CM | POA: Diagnosis not present

## 2017-03-25 ENCOUNTER — Ambulatory Visit
Admission: RE | Admit: 2017-03-25 | Discharge: 2017-03-25 | Disposition: A | Discharge status: Home / Self Care | Payer: Medicare Other | Source: Ambulatory Visit | Attending: Radiation Oncology | Admitting: Radiation Oncology

## 2017-03-25 DIAGNOSIS — C8334 Diffuse large B-cell lymphoma, lymph nodes of axilla and upper limb: Secondary | ICD-10-CM | POA: Diagnosis not present

## 2017-03-27 ENCOUNTER — Other Ambulatory Visit: Payer: Self-pay | Admitting: Family Medicine

## 2017-03-28 ENCOUNTER — Ambulatory Visit
Admission: RE | Admit: 2017-03-28 | Discharge: 2017-03-28 | Disposition: A | Discharge status: Home / Self Care | Payer: Medicare Other | Source: Ambulatory Visit | Attending: Radiation Oncology | Admitting: Radiation Oncology

## 2017-03-28 DIAGNOSIS — C8334 Diffuse large B-cell lymphoma, lymph nodes of axilla and upper limb: Secondary | ICD-10-CM | POA: Diagnosis not present

## 2017-03-29 ENCOUNTER — Ambulatory Visit
Admission: RE | Admit: 2017-03-29 | Discharge: 2017-03-29 | Disposition: A | Discharge status: Home / Self Care | Payer: Medicare Other | Source: Ambulatory Visit | Attending: Radiation Oncology | Admitting: Radiation Oncology

## 2017-03-29 ENCOUNTER — Ambulatory Visit: Payer: Medicare Other

## 2017-03-29 DIAGNOSIS — C8334 Diffuse large B-cell lymphoma, lymph nodes of axilla and upper limb: Secondary | ICD-10-CM | POA: Diagnosis not present

## 2017-03-30 ENCOUNTER — Inpatient Hospital Stay: Payer: Medicare Other | Attending: Internal Medicine

## 2017-03-30 ENCOUNTER — Ambulatory Visit
Admission: RE | Admit: 2017-03-30 | Discharge: 2017-03-30 | Disposition: A | Discharge status: Home / Self Care | Payer: Medicare Other | Source: Ambulatory Visit | Attending: Radiation Oncology | Admitting: Radiation Oncology

## 2017-03-30 ENCOUNTER — Ambulatory Visit: Payer: Medicare Other

## 2017-03-30 DIAGNOSIS — Z95828 Presence of other vascular implants and grafts: Secondary | ICD-10-CM

## 2017-03-30 DIAGNOSIS — C8334 Diffuse large B-cell lymphoma, lymph nodes of axilla and upper limb: Secondary | ICD-10-CM | POA: Insufficient documentation

## 2017-03-30 LAB — COMPREHENSIVE METABOLIC PANEL
ALBUMIN: 4 g/dL (ref 3.5–5.0)
ALK PHOS: 64 U/L (ref 38–126)
ALT: 16 U/L — ABNORMAL LOW (ref 17–63)
ANION GAP: 5 (ref 5–15)
AST: 17 U/L (ref 15–41)
BILIRUBIN TOTAL: 0.7 mg/dL (ref 0.3–1.2)
BUN: 19 mg/dL (ref 6–20)
CALCIUM: 9.3 mg/dL (ref 8.9–10.3)
CO2: 24 mmol/L (ref 22–32)
Chloride: 108 mmol/L (ref 101–111)
Creatinine, Ser: 1.32 mg/dL — ABNORMAL HIGH (ref 0.61–1.24)
GFR calc Af Amer: >60 mL/min (ref >60)
GFR calc non Af Amer: 53 mL/min — ABNORMAL LOW (ref >60)
GLUCOSE: 94 mg/dL (ref 65–99)
Potassium: 4.1 mmol/L (ref 3.5–5.1)
Sodium: 137 mmol/L (ref 135–145)
TOTAL PROTEIN: 7 g/dL (ref 6.5–8.1)

## 2017-03-30 LAB — CBC WITH DIFFERENTIAL/PLATELET
BASOS ABS: 0.1 10*3/uL (ref 0–0.1)
BASOS PCT: 2 %
EOS PCT: 11 %
Eosinophils Absolute: 0.6 10*3/uL (ref 0–0.7)
HCT: 37.9 % — ABNORMAL LOW (ref 40.0–52.0)
Hemoglobin: 13.1 g/dL (ref 13.0–18.0)
Lymphocytes Relative: 7 %
Lymphs Abs: 0.4 10*3/uL — ABNORMAL LOW (ref 1.0–3.6)
MCH: 32.3 pg (ref 26.0–34.0)
MCHC: 34.7 g/dL (ref 32.0–36.0)
MCV: 93.3 fL (ref 80.0–100.0)
MONO ABS: 0.7 10*3/uL (ref 0.2–1.0)
Monocytes Relative: 12 %
Neutro Abs: 3.9 10*3/uL (ref 1.4–6.5)
Neutrophils Relative %: 68 %
PLATELETS: 147 10*3/uL — AB (ref 150–440)
RBC: 4.06 MIL/uL — ABNORMAL LOW (ref 4.40–5.90)
RDW: 14.8 % — AB (ref 11.5–14.5)
WBC: 5.7 10*3/uL (ref 3.8–10.6)

## 2017-03-30 LAB — LACTATE DEHYDROGENASE: LDH: 133 U/L (ref 98–192)

## 2017-03-30 MED ORDER — HEPARIN SOD (PORK) LOCK FLUSH 100 UNIT/ML IV SOLN
500.0000 [IU] | INTRAVENOUS | Status: AC | PRN
  Administered 2017-03-30: 500 [IU]

## 2017-03-30 MED ORDER — SODIUM CHLORIDE 0.9% FLUSH
10.0000 mL | INTRAVENOUS | Status: AC | PRN
  Administered 2017-03-30: 10 mL
  Filled 2017-03-30: qty 10

## 2017-05-04 ENCOUNTER — Ambulatory Visit: Payer: Self-pay | Admitting: Internal Medicine

## 2017-05-04 ENCOUNTER — Other Ambulatory Visit: Payer: Self-pay

## 2017-05-06 ENCOUNTER — Ambulatory Visit
Admission: RE | Admit: 2017-05-06 | Discharge: 2017-05-06 | Disposition: A | Discharge status: Home / Self Care | Payer: Medicare Other | Source: Ambulatory Visit | Attending: Internal Medicine | Admitting: Internal Medicine

## 2017-05-06 DIAGNOSIS — J341 Cyst and mucocele of nose and nasal sinus: Secondary | ICD-10-CM | POA: Diagnosis not present

## 2017-05-06 DIAGNOSIS — I2510 Atherosclerotic heart disease of native coronary artery without angina pectoris: Secondary | ICD-10-CM | POA: Insufficient documentation

## 2017-05-06 DIAGNOSIS — Z01812 Encounter for preprocedural laboratory examination: Secondary | ICD-10-CM | POA: Insufficient documentation

## 2017-05-06 DIAGNOSIS — C8334 Diffuse large B-cell lymphoma, lymph nodes of axilla and upper limb: Secondary | ICD-10-CM | POA: Diagnosis not present

## 2017-05-06 LAB — GLUCOSE, CAPILLARY: Glucose-Capillary: 99 mg/dL (ref 65–99)

## 2017-05-06 MED ORDER — FLUDEOXYGLUCOSE F - 18 (FDG) INJECTION
12.2500 | Freq: Once | INTRAVENOUS | Status: AC | PRN
  Administered 2017-05-06: 12.25 via INTRAVENOUS

## 2017-05-11 ENCOUNTER — Inpatient Hospital Stay: Payer: Medicare Other | Attending: Internal Medicine | Admitting: Internal Medicine

## 2017-05-11 ENCOUNTER — Other Ambulatory Visit: Payer: Self-pay

## 2017-05-11 VITALS — BP 151/78 | HR 81 | Temp 97.4°F | Resp 20 | Ht 73.0 in | Wt 222.0 lb

## 2017-05-11 DIAGNOSIS — N181 Chronic kidney disease, stage 1: Secondary | ICD-10-CM | POA: Diagnosis not present

## 2017-05-11 DIAGNOSIS — Z923 Personal history of irradiation: Secondary | ICD-10-CM | POA: Insufficient documentation

## 2017-05-11 DIAGNOSIS — I2510 Atherosclerotic heart disease of native coronary artery without angina pectoris: Secondary | ICD-10-CM | POA: Diagnosis not present

## 2017-05-11 DIAGNOSIS — K219 Gastro-esophageal reflux disease without esophagitis: Secondary | ICD-10-CM | POA: Diagnosis not present

## 2017-05-11 DIAGNOSIS — I129 Hypertensive chronic kidney disease with stage 1 through stage 4 chronic kidney disease, or unspecified chronic kidney disease: Secondary | ICD-10-CM | POA: Diagnosis not present

## 2017-05-11 DIAGNOSIS — Z79899 Other long term (current) drug therapy: Secondary | ICD-10-CM | POA: Insufficient documentation

## 2017-05-11 DIAGNOSIS — Z452 Encounter for adjustment and management of vascular access device: Secondary | ICD-10-CM | POA: Insufficient documentation

## 2017-05-11 DIAGNOSIS — E785 Hyperlipidemia, unspecified: Secondary | ICD-10-CM | POA: Insufficient documentation

## 2017-05-11 DIAGNOSIS — C8334 Diffuse large B-cell lymphoma, lymph nodes of axilla and upper limb: Secondary | ICD-10-CM | POA: Insufficient documentation

## 2017-05-11 DIAGNOSIS — Z7982 Long term (current) use of aspirin: Secondary | ICD-10-CM | POA: Diagnosis not present

## 2017-05-11 DIAGNOSIS — G620 Drug-induced polyneuropathy: Secondary | ICD-10-CM | POA: Insufficient documentation

## 2017-05-11 DIAGNOSIS — Z9221 Personal history of antineoplastic chemotherapy: Secondary | ICD-10-CM | POA: Insufficient documentation

## 2017-05-11 DIAGNOSIS — T451X5S Adverse effect of antineoplastic and immunosuppressive drugs, sequela: Secondary | ICD-10-CM | POA: Insufficient documentation

## 2017-05-11 DIAGNOSIS — M1990 Unspecified osteoarthritis, unspecified site: Secondary | ICD-10-CM

## 2017-05-11 NOTE — Progress Notes (Signed)
[place] NOTE  Patient Care Team: Jerrol Banana., MD as PCP - General (Family Medicine) Carmon Ginsberg, [place] as Referring Physician (Family Medicine) Christene Lye, MD (General Surgery)  CHIEF COMPLAINTS/PURPOSE OF CONSULTATION:  Lymphoma  #  Oncology History   # AUG 6th 2018- DLBCL STAGE I [LEFT axilla- Core Bx- Dr.Sankar; CD-20, bcl-2;bcl-6 positive]; Ki- 67 >90%. MUM-1 NEG. GCB subtype; Myc- POS; FISH studies-NEGATIVE for gene RE-ARRANGEMENTS [positive for extra-copies of bcl-6/ myc/IgH/bcl-2]PET scan- Right Ax LN [4.5cm; SUV 35]  # R-CHOP [aug 20th 2018] x3 cycles; RT [starting 10/25]; DEc 2018- CR ; except 8 mm left ax LN- no uptake.   # AUG 13th 2018. MUGA scan-56%.      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  male with diffuse large B-cell lymphoma stage I currently status post cycle #3 of R- CHOP chemotherapy; followed by involved field radiation finished approximately a month ago is here to review the results of his restaging PET scan.  He denies any worsening of his left underarm lymph node; in fact notes significant improvement. He continues to complain of  mild tingling and numbness in his hands and feet from the chemotherapy-this is not any worse.He denies any nausea vomiting. Denies any chills. No night sweats.   ROS: A complete 10 point review of system is done which is negative except mentioned above in history of present illness  MEDICAL HISTORY:  Past Medical History:  Diagnosis Date  . Allergy   . Arthritis   . Cancer ([place])    left axillary  . Cataract   . GERD (gastroesophageal reflux disease)   . Hyperlipidemia   . Hypertension     SURGICAL HISTORY: Past Surgical History:  Procedure Laterality Date  . CATARACT EXTRACTION Bilateral   . MOLE REMOVAL    . NECK MASS EXCISION    . PORTACATH PLACEMENT N/A 12/29/2016   Procedure:  INSERTION PORT-A-CATH;  Surgeon: Christene Lye, MD;  Location: [facility] ORS;  Service: General;  Laterality: N/A;  . TONSILLECTOMY      SOCIAL HISTORY:lives in [place], never smoked; 2 beers/ day. 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  Social History   Socioeconomic History  . Marital status: Married    Spouse name: Not on file  . Number of children: Not on file  . Years of education: Not on file  . Highest education level: Not on file  Social Needs  . Financial resource strain: Not on file  . Food insecurity - worry: Not on file  . Food insecurity - inability: Not on file  . Transportation needs - medical: Not on file  . Transportation needs - non-medical: Not on file  Occupational History  . Not on file  Tobacco Use  . Smoking status: Never Smoker  . Smokeless tobacco: Never Used  Substance and Sexual Activity  . Alcohol use: Yes    Alcohol/week: 0.0 oz    Comment: 2 beers 4-5 times per week  . Drug use: No  . Sexual activity: Not on file  Other Topics Concern  . Not on file  Social History Narrative  . Not on file    FAMILY HISTORY: Family History  Problem Relation Age of Onset  . Heart disease Mother   . Rheumatic fever Mother   . CAD Mother   . Heart attack Father   . COPD Sister  ALLERGIES:  is allergic to lovastatin; pravastatin; pravastatin sodium; simvastatin; statins; and sulfa antibiotics.  MEDICATIONS:  Current Outpatient Medications  Medication Sig Dispense Refill  . amLODipine (NORVASC) 5 MG tablet TAKE ONE TABLET BY MOUTH DAILY 90 tablet 3  . lisinopril (PRINIVIL,ZESTRIL) 40 MG tablet TAKE 1 TABLET (40 MG TOTAL) BY MOUTH DAILY. 90 tablet 3  . omeprazole (PRILOSEC) 20 MG capsule TAKE 1 CAPSULE (20 MG TOTAL) BY MOUTH DAILY. 90 capsule 3  . aspirin EC 81 MG tablet Take 81 mg by mouth daily.    . cetirizine (ZYRTEC) 10 MG tablet Take 10 mg by mouth daily.    . cholecalciferol (VITAMIN D) 1000 UNITS tablet Take 1,000 Units by  mouth daily.     Marland Kitchen docusate sodium (COLACE) 100 MG capsule Take 100 mg by mouth daily.    Marland Kitchen ibuprofen (ADVIL,MOTRIN) 200 MG tablet Take 800 mg by mouth every 8 (eight) hours as needed (for pain).    Marland Kitchen lidocaine-prilocaine (EMLA) cream Apply 1 application topically as needed. To port a cath (Patient not taking: Reported on 05/11/2017) 30 g 3  . phenylephrine-shark liver oil-mineral oil-petrolatum (PREPARATION H) 0.25-14-74.9 % rectal ointment Place 1 application rectally 2 (two) times daily as needed for hemorrhoids.     No current facility-administered medications for this visit.       Marland Kitchen  PHYSICAL EXAMINATION: ECOG PERFORMANCE STATUS: 1 - Symptomatic but completely ambulatory  Vitals:   05/11/17 0851  BP: (!) 151/78  Pulse: 81  Resp: 20  Temp: (!) 97.4 F (36.3 C)   Filed Weights   05/11/17 0851  Weight: 222 lb (100.7 kg)    GENERAL: Well-nourished well-developed; Alert, no distress and comfortable. He is alone. EYES: no pallor or icterus OROPHARYNX: no thrush or ulceration; good dentition  NECK: supple, no masses felt LYMPH:  no palpable lymphadenopathy in the cervical,  or inguinal regions. Soft vague/palpable mass noted in the left axilla ~ 1 cm in size [improved] LUNGS: clear to auscultation and  No wheeze or crackles HEART/CVS: regular rate & rhythm and no murmurs; No lower extremity edema ABDOMEN: abdomen soft, non-tender and normal bowel sounds Musculoskeletal:no cyanosis of digits and no clubbing  PSYCH: alert & oriented x 3 with fluent speech NEURO: no focal motor/sensory deficits SKIN:  no rashes or significant lesions  LABORATORY DATA:  I have reviewed the data as listed Lab Results  Component Value Date   WBC 5.7 03/30/2017   HGB 13.1 03/30/2017   HCT 37.9 (L) 03/30/2017   MCV 93.3 03/30/2017   PLT 147 (L) 03/30/2017   Recent Labs    02/14/17 0830 03/09/17 0918 03/30/17 1036  NA 142 140 137  K 4.0 4.2 4.1  CL 111 108 108  CO2 _0 GLUCOSE  140* 104* 94  BUN _1 CREATININE 1.23 1.29* 1.32*  CALCIUM 9.2 9.1 9.3  GFRNONAA 59* 55* 53*  GFRAA >60 >60 >60  PROT 6.7 6.8 7.0  ALBUMIN 3.8 3.8 4.0  AST _2 ALT 18 16* 16*  ALKPHOS 68 67 64  BILITOT 0.5 0.6 0.7    RADIOGRAPHIC STUDIES: I have personally reviewed the radiological images as listed and agreed with the findings in the report. Nm Pet Image Restag (ps) Skull Base To Thigh  Result Date: 05/06/2017 CLINICAL DATA:  Subsequent treatment strategy for diffuse large B- cell lymphoma. EXAM: NUCLEAR MEDICINE PET SKULL BASE TO THIGH TECHNIQUE: 12.3 mCi F-18 FDG was injected intravenously. Full-ring PET  imaging was performed from the skull base to thigh after the radiotracer. CT data was obtained and used for attenuation correction and anatomic localization. FASTING BLOOD GLUCOSE:  Value: 99 mg/dl COMPARISON:  Multiple exams, including 12/23/2016 FINDINGS: NECK No hypermetabolic lymph nodes in the neck. Polypoid mucoperiosteal thickening of the right maxillary sinus. 1.7 cm right posterior thyroid nodule with solid appearance but no accentuated metabolic activity, likely benign. CHEST At the site of the previous highly hypermetabolic left nodal mass, there is only minimal residual nodularity with short axis diameter 0.8 cm on image 84/3 (formerly 2.5 cm), maximum SUV 1.4 (formerly 37.3). Right Port-A-Cath tip: Lower SVC. No new abnormal hypermetabolic activity in the chest. Coronary artery atherosclerosis is observed along with mild cardiomegaly. Mediastinal blood pool activity: 2.3. ABDOMEN/PELVIS No abnormal hypermetabolic activity within the liver, pancreas, adrenal glands, or spleen. No hypermetabolic lymph nodes in the abdomen or pelvis. Left kidney upper pole hypodense exophytic lesion is photopenic and likely a cyst. Background hepatic parenchymal activity:  3.6. SKELETON No focal hypermetabolic activity to suggest skeletal metastasis. IMPRESSION: 1. Minimal residual nodal  tissue in the left axilla with short axis diameter 0.8 cm (formerly 2.5 cm), and maximum SUV of only 1.4 (formerly 37.3). This represents Deauville 2 activity. 2. No new adenopathy or new hypermetabolic activity. 3. Coronary artery atherosclerosis. 4. Right maxillary sinus mucous retention cyst. Electronically Signed   By: Van Clines M.D.   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 Patient currently s/p  R-CHOP s/p cycle #3; s/p IFRT 10/25-start. DEC 2018- PET- CR- except for 1cm LN [on exam]; monitor for now.  Great clinical response.  Discussed that we will not recommend further imaging unless clinically indicated.  He agrees.  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  # CKD- Stage I-creat 1.29; awaiting from labs this week.   # port flush in 8 weeks; will keep the port for now; will recommend explantation of the port if no progression noted in the next many months.  # port flush in 4 months; labs/MD.   # I reviewed the blood work- with the patient in detail; also reviewed the imaging independently [as summarized above]; and with the patient in detail.   All questions were answered. The patient knows to call the clinic with any problems, questions or concerns.    Cammie Sickle, MD 05/13/2017 6:25 AM

## 2017-05-11 NOTE — Assessment & Plan Note (Addendum)
Diffuse large B-cell lymphoma- stage I. Patient currently s/p  R-CHOP s/p cycle #3; s/p IFRT 10/25-start. DEC 2018- PET- CR- except for 1cm LN [on exam]; monitor for now.  Great clinical response.  Discussed that we will not recommend further imaging unless clinically indicated.  He agrees.  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  # CKD- Stage I-creat 1.29; awaiting from labs this week.   # port flush in 8 weeks; will keep the port for now; will recommend explantation of the port if no progression noted in the next many months.  # port flush in 4 months; labs/MD.   # I reviewed the blood work- with the patient in detail; also reviewed the imaging independently [as summarized above]; and with the patient in detail.

## 2017-05-13 ENCOUNTER — Inpatient Hospital Stay: Payer: Medicare Other

## 2017-05-13 ENCOUNTER — Ambulatory Visit
Admission: RE | Admit: 2017-05-13 | Discharge: 2017-05-13 | Disposition: A | Discharge status: Home / Self Care | Payer: Medicare Other | Source: Ambulatory Visit | Attending: Radiation Oncology | Admitting: Radiation Oncology

## 2017-05-13 ENCOUNTER — Other Ambulatory Visit: Payer: Self-pay

## 2017-05-13 ENCOUNTER — Encounter: Payer: Self-pay | Admitting: Radiation Oncology

## 2017-05-13 VITALS — BP 128/79 | HR 84 | Temp 96.2°F | Resp 20 | Wt 221.1 lb

## 2017-05-13 DIAGNOSIS — Z9221 Personal history of antineoplastic chemotherapy: Secondary | ICD-10-CM | POA: Insufficient documentation

## 2017-05-13 DIAGNOSIS — C8334 Diffuse large B-cell lymphoma, lymph nodes of axilla and upper limb: Secondary | ICD-10-CM

## 2017-05-13 DIAGNOSIS — Z923 Personal history of irradiation: Secondary | ICD-10-CM | POA: Diagnosis not present

## 2017-05-13 DIAGNOSIS — Z95828 Presence of other vascular implants and grafts: Secondary | ICD-10-CM

## 2017-05-13 LAB — COMPREHENSIVE METABOLIC PANEL
ALBUMIN: 3.8 g/dL (ref 3.5–5.0)
ALK PHOS: 63 U/L (ref 38–126)
ALT: 14 U/L — AB (ref 17–63)
ANION GAP: 6 (ref 5–15)
AST: 21 U/L (ref 15–41)
BUN: 22 mg/dL — AB (ref 6–20)
CALCIUM: 9.2 mg/dL (ref 8.9–10.3)
CO2: 24 mmol/L (ref 22–32)
CREATININE: 1.23 mg/dL (ref 0.61–1.24)
Chloride: 107 mmol/L (ref 101–111)
GFR calc Af Amer: >60 mL/min (ref >60)
GFR calc non Af Amer: 58 mL/min — ABNORMAL LOW (ref >60)
GLUCOSE: 142 mg/dL — AB (ref 65–99)
Potassium: 3.9 mmol/L (ref 3.5–5.1)
SODIUM: 137 mmol/L (ref 135–145)
Total Bilirubin: 0.5 mg/dL (ref 0.3–1.2)
Total Protein: 6.9 g/dL (ref 6.5–8.1)

## 2017-05-13 LAB — CBC WITH DIFFERENTIAL/PLATELET
BASOS PCT: 2 %
Basophils Absolute: 0.1 10*3/uL (ref 0–0.1)
EOS PCT: 10 %
Eosinophils Absolute: 0.5 10*3/uL (ref 0–0.7)
HEMATOCRIT: 39.6 % — AB (ref 40.0–52.0)
Hemoglobin: 13.8 g/dL (ref 13.0–18.0)
Lymphocytes Relative: 15 %
Lymphs Abs: 0.8 10*3/uL — ABNORMAL LOW (ref 1.0–3.6)
MCH: 32.1 pg (ref 26.0–34.0)
MCHC: 34.7 g/dL (ref 32.0–36.0)
MCV: 92.4 fL (ref 80.0–100.0)
MONO ABS: 0.5 10*3/uL (ref 0.2–1.0)
MONOS PCT: 9 %
NEUTROS ABS: 3.3 10*3/uL (ref 1.4–6.5)
Neutrophils Relative %: 64 %
Platelets: 204 10*3/uL (ref 150–440)
RBC: 4.29 MIL/uL — ABNORMAL LOW (ref 4.40–5.90)
RDW: 12.4 % (ref 11.5–14.5)
WBC: 5.1 10*3/uL (ref 3.8–10.6)

## 2017-05-13 LAB — LACTATE DEHYDROGENASE: LDH: 113 U/L (ref 98–192)

## 2017-05-13 MED ORDER — HEPARIN SOD (PORK) LOCK FLUSH 100 UNIT/ML IV SOLN
500.0000 [IU] | INTRAVENOUS | Status: AC | PRN
  Administered 2017-05-13: 500 [IU]

## 2017-05-13 MED ORDER — SODIUM CHLORIDE 0.9% FLUSH
10.0000 mL | INTRAVENOUS | Status: AC | PRN
  Administered 2017-05-13: 10 mL
  Filled 2017-05-13: qty 10

## 2017-05-13 NOTE — Progress Notes (Signed)
Radiation Oncology Follow up Note  Name: Henry [name]   Date:   05/13/2017 MRN:  [id number] DOB: [birth year]    This 71 y.o. male presents to the clinic today for one-month follow-up status post involved field radiation therapy to his left axilla for diffuse large B-cell lymphoma stage I.  REFERRING PROVIDER: Jerrol Banana.,*  HPI: Patient is a 71 year old male now out 1 month having completed involved field radiation therapy to his left axilla for diffuse large B-cell lymphoma stage I status post R CHOP chemotherapy. He is seen today and is doing well. He specifically denies fever chills night sweats any swelling of his left upper extremity or any new adenopathy. He had a recent PET CT scan showing no evidence of hypermetabolic activity in the left axilla still some residual nodes are present. But could be consistent with scarring..  COMPLICATIONS OF TREATMENT: none  FOLLOW UP COMPLIANCE: keeps appointments   PHYSICAL EXAM:  BP 128/79   Pulse 84   Temp (!) 96.2 F (35.7 C)   Resp 20   Wt 221 lb 1.9 oz (100.3 kg)   BMI 29.17 kg/m  No detectable adenopathy in the left axilla supraclavicular region is identified. Well-developed well-nourished patient in NAD. HEENT reveals PERLA, EOMI, discs not visualized.  Oral cavity is clear. No oral mucosal lesions are identified. Neck is clear without evidence of cervical or supraclavicular adenopathy. Lungs are clear to A&P. Cardiac examination is essentially unremarkable with regular rate and rhythm without murmur rub or thrill. Abdomen is benign with no organomegaly or masses noted. Motor sensory and DTR levels are equal and symmetric in the upper and lower extremities. Cranial nerves II through XII are grossly intact. Proprioception is intact. No peripheral adenopathy or edema is identified. No motor or sensory levels are noted. Crude visual fields are within normal range.  RADIOLOGY RESULTS: PET CT scan is reviewed and compatible  above-stated findings  PLAN: The present time patient appears to be in remission. He is doing well and I'm please was overall progress. I've asked to see him back in 4-5 months for follow-up. He will have follow-up PET CT scans ordered by medical oncology. Patient is to call anytime with any concerns.  I would like to take this opportunity to thank you for allowing me to participate in the care of your patient.Noreene Filbert, MD

## 2017-06-25 ENCOUNTER — Other Ambulatory Visit: Payer: Self-pay | Admitting: Family Medicine

## 2017-07-13 ENCOUNTER — Ambulatory Visit: Payer: Self-pay

## 2017-07-13 ENCOUNTER — Inpatient Hospital Stay: Payer: Medicare Other

## 2017-07-13 ENCOUNTER — Inpatient Hospital Stay: Payer: Self-pay

## 2017-07-13 ENCOUNTER — Inpatient Hospital Stay: Payer: Medicare Other | Attending: Oncology | Admitting: Oncology

## 2017-07-13 VITALS — BP 134/90 | HR 68 | Temp 96.4°F | Wt 223.3 lb

## 2017-07-13 DIAGNOSIS — G620 Drug-induced polyneuropathy: Secondary | ICD-10-CM | POA: Diagnosis not present

## 2017-07-13 DIAGNOSIS — C8334 Diffuse large B-cell lymphoma, lymph nodes of axilla and upper limb: Secondary | ICD-10-CM | POA: Diagnosis not present

## 2017-07-13 DIAGNOSIS — T451X5A Adverse effect of antineoplastic and immunosuppressive drugs, initial encounter: Secondary | ICD-10-CM

## 2017-07-13 DIAGNOSIS — Z8579 Personal history of other malignant neoplasms of lymphoid, hematopoietic and related tissues: Secondary | ICD-10-CM

## 2017-07-13 DIAGNOSIS — Z95828 Presence of other vascular implants and grafts: Secondary | ICD-10-CM

## 2017-07-13 MED ORDER — HEPARIN SOD (PORK) LOCK FLUSH 100 UNIT/ML IV SOLN
500.0000 [IU] | Freq: Once | INTRAVENOUS | Status: AC
  Administered 2017-07-13: 500 [IU] via INTRAVENOUS

## 2017-07-13 MED ORDER — SODIUM CHLORIDE 0.9% FLUSH
10.0000 mL | INTRAVENOUS | Status: DC | PRN
  Administered 2017-07-13: 10 mL via INTRAVENOUS
  Filled 2017-07-13: qty 10

## 2017-07-13 NOTE — Progress Notes (Signed)
Survivorship Clinic Consult note [facility]  Telephone:([phone] [phone]  CLINIC:  Survivorship   REASON FOR VISIT:  Routine follow-up post-treatment for a recent history of  grade1 B-Cell Lymphoma diagnosed 12/20/2016.   BRIEF ONCOLOGIC HISTORY:  Oncology History   # AUG 6th 2018- DLBCL STAGE I [LEFT axilla- Core Bx- Dr.Sankar; CD-20, bcl-2;bcl-6 positive]; Ki- 67 >90%. MUM-1 NEG. GCB subtype; Myc- POS; FISH studies-NEGATIVE for gene RE-ARRANGEMENTS [positive for extra-copies of bcl-6/ myc/IgH/bcl-2]PET scan- Right Ax LN [4.5cm; SUV 35]  # R-CHOP [aug 20th 2018] x3 cycles; RT [starting 10/25]; DEc 2018- CR ; except 8 mm left ax LN- no uptake.   # AUG 13th 2018. MUGA scan-56%.      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    Overall, Henry [name] reports feeling quite well since completing his radiation and 3 cycles of RCHOP chemotherapy. Completed Cycle 3 of RCHOP on 02/14/2017.  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 He feels this is continuing to improve. He is not taking any medications for this problem and does not intend to. He is scheduled for follow-up with Dr. Rogue Bussing in 3 months.     REVIEW OF SYSTEMS:  Review of Systems  Constitutional: Positive for fatigue (this is much improved). Negative for appetite change, chills and fever.  HENT:  Negative.  Negative for hearing loss, lump/mass, mouth sores and nosebleeds.   Eyes: Negative.  Negative for eye problems.  Respiratory: Negative for cough, hemoptysis and shortness of breath.   Cardiovascular: Negative.   Negative for chest pain and leg swelling.  Gastrointestinal: Negative.  Negative for abdominal pain, blood in stool, constipation, diarrhea, nausea and vomiting.  Endocrine: Negative.  Negative for hot flashes.  Genitourinary: Negative.  Negative for bladder incontinence, difficulty urinating, dysuria, frequency and hematuria.   Musculoskeletal: Negative.  Negative for back pain, flank pain, gait problem and myalgias.  Skin: Negative.  Negative for itching and rash.  Neurological: Positive for numbness (BUE). Negative for dizziness, gait problem, headaches and light-headedness.  Hematological: Negative.  Negative for adenopathy.  Psychiatric/Behavioral: Negative for confusion and depression. The patient is not nervous/anxious.     ONCOLOGY TREATMENT TEAM:  1. Surgeon:  Dr. Jamal Collin 2. Medical Oncologist: Dr. Rogue Bussing  3. Radiation Oncologist: Dr. Baruch Gouty     PAST MEDICAL/SURGICAL HISTORY:  Past Medical History:  Diagnosis Date  . Allergy   . Arthritis   . Cancer ([place])    left axillary  . Cataract   . GERD (gastroesophageal reflux disease)   . Hyperlipidemia   . Hypertension    Past Surgical History:  Procedure Laterality Date  . CATARACT EXTRACTION Bilateral   . MOLE REMOVAL    . NECK MASS EXCISION    . PORTACATH PLACEMENT N/A 12/29/2016   Procedure: INSERTION PORT-A-CATH;  Surgeon: Christene Lye, MD;  Location: [facility] ORS;  Service: General;  Laterality: N/A;  . TONSILLECTOMY       ALLERGIES:  Allergies  Allergen Reactions  . Lovastatin Other (See Comments)  . Pravastatin Nausea And Vomiting    joint ache  . Pravastatin Sodium     joint ache  . Simvastatin Other (  See Comments)    muscle ache muscle ache  . Statins   . Sulfa Antibiotics Rash and Itching     CURRENT MEDICATIONS:  Outpatient Encounter Medications as of 07/13/2017  Medication Sig  . amLODipine (NORVASC) 5 MG tablet TAKE ONE TABLET BY MOUTH DAILY  . aspirin EC 81 MG tablet Take 81 mg by  mouth daily.  . cetirizine (ZYRTEC) 10 MG tablet Take 10 mg by mouth daily.  . cholecalciferol (VITAMIN D) 1000 UNITS tablet Take 1,000 Units by mouth daily.   Marland Kitchen docusate sodium (COLACE) 100 MG capsule Take 100 mg by mouth daily.  Marland Kitchen ibuprofen (ADVIL,MOTRIN) 200 MG tablet Take 800 mg by mouth every 8 (eight) hours as needed (for pain).  Marland Kitchen lidocaine-prilocaine (EMLA) cream Apply 1 application topically as needed. To port a cath  . lisinopril (PRINIVIL,ZESTRIL) 40 MG tablet TAKE 1 TABLET (40 MG TOTAL) BY MOUTH DAILY.  Marland Kitchen omeprazole (PRILOSEC) 20 MG capsule TAKE 1 CAPSULE (20 MG TOTAL) BY MOUTH DAILY.  Marland Kitchen phenylephrine-shark liver oil-mineral oil-petrolatum (PREPARATION H) 0.25-14-74.9 % rectal ointment Place 1 application rectally 2 (two) times daily as needed for hemorrhoids.   No facility-administered encounter medications on file as of 07/13/2017.      ONCOLOGIC FAMILY HISTORY:  Family History  Problem Relation Age of Onset  . Heart disease Mother   . Rheumatic fever Mother   . CAD Mother   . Heart attack Father   . COPD Sister      GENETIC COUNSELING/TESTING: None  SOCIAL HISTORY:  Henry [name] is married and lives with his spouse in [place], [place].  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 He denies any current or history of tobacco, alcohol, or illicit drug use.     PHYSICAL EXAMINATION:  Vital Signs:   Vitals:   07/13/17 1212  BP: 134/90  Pulse: 68  Temp: (!) 96.4 F (35.8 C)   Filed Weights   07/13/17 1212  Weight: 223 lb 4.8 oz (101.3 kg)    Physical Exam  Constitutional: He is oriented to person, place, and time and well-developed, well-nourished, and in no distress. Vital signs are normal.  HENT:  Head: Normocephalic and atraumatic.  Eyes: Pupils are equal, round, and reactive to light.  Neck: Normal range of motion.  Cardiovascular: Normal rate, regular rhythm and normal heart sounds.  No murmur  heard. Pulmonary/Chest: Effort normal and breath sounds normal. He has no wheezes.  Abdominal: Soft. Normal appearance and bowel sounds are normal. He exhibits no distension. There is no tenderness.  Musculoskeletal: Normal range of motion. He exhibits no edema.  Lymphadenopathy:    He has no cervical adenopathy.    He has no axillary adenopathy.  Neurological: He is alert and oriented to person, place, and time. Gait normal.  Skin: Skin is warm and dry. No rash noted.  Psychiatric: Mood, memory, affect and judgment normal.   LABORATORY DATA:  CBC Latest Ref Rng & Units 05/13/2017 03/30/2017 03/09/2017  WBC 3.8 - 10.6 K/uL 5.1 5.7 7.9  Hemoglobin 13.0 - 18.0 g/dL 13.8 13.1 12.6(L)  Hematocrit 40.0 - 52.0 % 39.6(L) 37.9(L) 36.0(L)  Platelets 150 - 440 K/uL 204 147(L) 261   CMP Latest Ref Rng & Units 05/13/2017 03/30/2017 03/09/2017  Glucose 65 - 99 mg/dL 142(H) 94 104(H)  BUN 6 - 20 mg/dL 22(H) 19 19  Creatinine 0.61 - 1.24 mg/dL 1.23 1.32(H) 1.29(H)  Sodium 135 - 145 mmol/L 137 137 140  Potassium  3.5 - 5.1 mmol/L 3.9 4.1 4.2  Chloride 101 - 111 mmol/L 107 108 108  CO2 22 - 32 mmol/L 24 24 25   Calcium 8.9 - 10.3 mg/dL 9.2 9.3 9.1  Total Protein 6.5 - 8.1 g/dL 6.9 7.0 6.8  Total Bilirubin 0.3 - 1.2 mg/dL 0.5 0.7 0.6  Alkaline Phos 38 - 126 U/L 63 64 67  AST 15 - 41 U/L 21 17 18   ALT 17 - 63 U/L 14(L) 16(L) 16(L)   DIAGNOSTIC IMAGING:   05/06/17 CLINICAL DATA:  Subsequent treatment strategy for diffuse large B- cell lymphoma.  EXAM: NUCLEAR MEDICINE PET SKULL BASE TO THIGH  TECHNIQUE: 12.3 mCi F-18 FDG was injected intravenously. Full-ring PET imaging was performed from the skull base to thigh after the radiotracer. CT data was obtained and used for attenuation correction and anatomic localization.  FASTING BLOOD GLUCOSE:  Value: 99 mg/dl  COMPARISON:  Multiple exams, including 12/23/2016  FINDINGS: NECK  No hypermetabolic lymph nodes in the  neck.  Polypoid mucoperiosteal thickening of the right maxillary sinus. 1.7 cm right posterior thyroid nodule with solid appearance but no accentuated metabolic activity, likely benign.  CHEST  At the site of the previous highly hypermetabolic left nodal mass, there is only minimal residual nodularity with short axis diameter 0.8 cm on image 84/3 (formerly 2.5 cm), maximum SUV 1.4 (formerly 37.3).  Right Port-A-Cath tip: Lower SVC. No new abnormal hypermetabolic activity in the chest.  Coronary artery atherosclerosis is observed along with mild cardiomegaly.  Mediastinal blood pool activity: 2.3.  ABDOMEN/PELVIS  No abnormal hypermetabolic activity within the liver, pancreas, adrenal glands, or spleen. No hypermetabolic lymph nodes in the abdomen or pelvis.  Left kidney upper pole hypodense exophytic lesion is photopenic and likely a cyst.  Background hepatic parenchymal activity:  3.6.  SKELETON  No focal hypermetabolic activity to suggest skeletal metastasis.  IMPRESSION: 1. Minimal residual nodal tissue in the left axilla with short axis diameter 0.8 cm (formerly 2.5 cm), and maximum SUV of only 1.4 (formerly 37.3). This represents Deauville 2 activity. 2. No new adenopathy or new hypermetabolic activity. 3. Coronary artery atherosclerosis. 4. Right maxillary sinus mucous retention cyst.   Electronically Signed   By: Van Clines M.D.   On: 05/06/2017 11:58  ASSESSMENT AND PLAN:  Mr.. [name] is a pleasant 72 y.o. 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 He had lymph node biopsy of left axilla on 12/20/2016 by Dr. Jamal Collin. PET/CT scan demonstrated hypermetabolic activity in the left axillary lymph node with no other evidence of hypermetabolic disease.  He received involved field radiation with Dr. Baruch Gouty 3000 cGy in 15 fractions. He received 3 cycles of R CHOP with Dr. Rogue Bussing. Repeat PET showed minimal residual nodal  tissue representing Deauville 2 activity. He tolerated all well.    1. Stage 1 Lymphoma: Henry [name] continues to do well post treatment. He is scheduled for follow-up with medical oncologist Dr. Rogue Bussing on 09/07/2017 with history and physical exam per surveillance protocol. Per NCCN guidelines patient will return to see Dr. Rogue Bussing every 3-6 months for 5 years and then yearly as clinically indicated. Repeat imaging with contrast only as clinically indicated (I.e patient is symptomatic).    Today, a comprehensive survivorship care plan and treatment summary was reviewed with the patient today detailing cancer diagnosis, treatment course, potential late/long-term effects of treatment, appropriate follow-up care with recommendations for the future, and patient education resources.  A copy of this summary, along with a letter  will be sent to the patient's primary care provider via mail/fax/In Basket message after today's visit.    #. Problem(s) at Visit: Mild neuropathy of bilateral upper extremities and mild fatigue that is improved. Not currently interfering with daily activities. Not taking any medications for problems at this time.  #. Cancer screening:  Due to Henry [name]'s history and her age, he should receive recommended screening for secondary cancers including skin cancers and colon cancer.  The information and recommendations are listed on the patient's comprehensive care plan/treatment summary and were reviewed in detail with the patient.    #. Health maintenance and wellness promotion: Henry [name] was encouraged to consume 5-7 servings of fruits and vegetables per day. We reviewed the "Nutrition Rainbow" handout, as well as the handout "Take Control of Your Health and Reduce Your Cancer Risk" from the [place].  She was also encouraged to engage in moderate to vigorous exercise for 30 minutes per day most days of the week. We discussed the [employer] fitness program, which is  designed for cancer survivors to help them become more physically fit after cancer treatments.  She was instructed to limit her alcohol consumption and continue to abstain from tobacco use.    #. Support services/counseling: It is not uncommon for this period of the patient's cancer care trajectory to be one of many emotions and stressors.  We discussed an opportunity for him to participate in the next session of [facility] ("Finding Your New Normal") support group series designed for patients after they have completed treatment.   Henry [name] was encouraged to take advantage of our many other support services programs, support groups, and/or counseling in coping with her new life as a cancer survivor after completing anti-cancer treatment.  He was offered support today through active listening and expressive supportive counseling.  He was given information regarding our available services and encouraged to contact me with any questions or for help enrolling in any of our support group/programs.    Dispo:   -Return to cancer center on 09/07/16 to see Dr. Rogue Bussing for labs and assessment.   -He is welcome to return back to the Survivorship Clinic at any time; no additional follow-up needed at this time.  -Consider referral back to survivorship as a long-term survivor for continued surveillance.  A total of (30) minutes of face-to-face time was spent with this patient with greater than 50% of that time in counseling and care-coordination.   Rulon Abide, AGNP Survivorship Program [place] [facility] [place]   Note: PRIMARY CARE PROVIDER Henry [name]., MD [phone]

## 2017-07-13 NOTE — Progress Notes (Signed)
Survivorship Care Plan visit completed.  Treatment summary reviewed and given to patient.  ASCO answers booklet reviewed and given to patient.  CARE program and Cancer Transitions discussed with patient along with other resources cancer center offers to patients and caregivers.  Patient verbalized understanding.    

## 2017-08-22 ENCOUNTER — Ambulatory Visit (INDEPENDENT_AMBULATORY_CARE_PROVIDER_SITE_OTHER): Payer: Medicare Other

## 2017-08-22 VITALS — BP 112/66 | HR 80 | Temp 98.4°F | Ht 73.0 in | Wt 220.2 lb

## 2017-08-22 DIAGNOSIS — Z0000 Encounter for general adult medical examination without abnormal findings: Secondary | ICD-10-CM | POA: Diagnosis not present

## 2017-08-22 NOTE — Progress Notes (Signed)
Subjective:   Henry [name] is a 72 y.o. male who presents for Medicare Annual/Subsequent preventive examination.  Review of Systems:  N/A  Cardiac Risk Factors include: advanced age (>24men, >66 women);dyslipidemia;hypertension;male gender     Objective:    Vitals: BP 112/66 (BP Location: Left Arm)   Pulse 80   Temp 98.4 F (36.9 C) (Oral)   Ht 6\' 1"  (1.854 m)   Wt 220 lb 3.2 oz (99.9 kg)   BMI 29.05 kg/m   Body mass index is 29.05 kg/m.  Advanced Directives 08/22/2017 05/13/2017 03/09/2017 02/14/2017 01/26/2017 01/24/2017 12/30/2016  Does Patient Have a Medical Advance Directive? Yes No No Yes Yes Yes Yes  Type of Paramedic of [place];Living will Living will - Living will [place];Living will [place];Living will [place];Living will  Does patient want to make changes to medical advance directive? - - - - - - No - Patient declined  Copy of [place] in Chart? No - copy requested No - copy requested - - No - copy requested No - copy requested No - copy requested  Would patient like information on creating a medical advance directive? - No - Patient declined - - - - -    Tobacco Social History   Tobacco Use  Smoking Status Never Smoker  Smokeless Tobacco Never Used     Counseling given: Not Answered   Clinical Intake:  Pre-visit preparation completed: Yes  Pain : No/denies pain Pain Score: 0-No pain     Nutritional Status: BMI 25 -29 Overweight Nutritional Risks: None Diabetes: No  How often do you need to have someone help you when you read instructions, pamphlets, or other written materials from your doctor or pharmacy?: 1 - Never  Interpreter Needed?: No  Information entered by :: [facility], LPN  Past Medical History:  Diagnosis Date  . Allergy   . Arthritis   . Cancer ([place])    left axillary  . Cataract   . GERD (gastroesophageal reflux disease)     . Hyperlipidemia   . Hypertension    Past Surgical History:  Procedure Laterality Date  . CATARACT EXTRACTION Bilateral   . MOLE REMOVAL    . NECK MASS EXCISION    . PORTACATH PLACEMENT N/A 12/29/2016   Procedure: INSERTION PORT-A-CATH;  Surgeon: Christene Lye, MD;  Location: [facility] ORS;  Service: General;  Laterality: N/A;  . TONSILLECTOMY     Family History  Problem Relation Age of Onset  . Heart disease Mother   . Rheumatic fever Mother   . CAD Mother   . Heart attack Father   . COPD Sister    Social History   Socioeconomic History  . Marital status: Married    Spouse name: Not on file  . Number of children: 2  . Years of education: Not on file  . Highest education level: Some college, no degree  Occupational History  . Occupation: retired  Scientific laboratory technician  . Financial resource strain: Not hard at all  . Food insecurity:    Worry: Never true    Inability: Never true  . Transportation needs:    Medical: No    Non-medical: No  Tobacco Use  . Smoking status: Never Smoker  . Smokeless tobacco: Never Used  Substance and Sexual Activity  . Alcohol use: Yes    Alcohol/week: 3.0 oz    Types: 5 Cans of beer per week  . Drug  use: No  . Sexual activity: Not on file  Lifestyle  . Physical activity:    Days per week: Not on file    Minutes per session: Not on file  . Stress: Not at all  Relationships  . Social connections:    Talks on phone: Not on file    Gets together: Not on file    Attends religious service: Not on file    Active member of club or organization: Not on file    Attends meetings of clubs or organizations: Not on file    Relationship status: Not on file  Other Topics Concern  . Not on file  Social History Narrative  . Not on file    Outpatient Encounter Medications as of 08/22/2017  Medication Sig  . amLODipine (NORVASC) 5 MG tablet TAKE ONE TABLET BY MOUTH DAILY  . aspirin EC 81 MG tablet Take 81 mg by mouth daily.  . cetirizine  (ZYRTEC) 10 MG tablet Take 10 mg by mouth daily.  . cholecalciferol (VITAMIN D) 1000 UNITS tablet Take 1,000 Units by mouth daily.   Marland Kitchen docusate sodium (COLACE) 100 MG capsule Take 200 mg by mouth daily.   Marland Kitchen ibuprofen (ADVIL,MOTRIN) 200 MG tablet Take 800 mg by mouth every 8 (eight) hours as needed (for pain).  Marland Kitchen lidocaine-prilocaine (EMLA) cream Apply 1 application topically as needed. To port a cath  . lisinopril (PRINIVIL,ZESTRIL) 40 MG tablet TAKE 1 TABLET (40 MG TOTAL) BY MOUTH DAILY.  Marland Kitchen omeprazole (PRILOSEC) 20 MG capsule TAKE 1 CAPSULE (20 MG TOTAL) BY MOUTH DAILY.  Marland Kitchen phenylephrine-shark liver oil-mineral oil-petrolatum (PREPARATION H) 0.25-14-74.9 % rectal ointment Place 1 application rectally 2 (two) times daily as needed for hemorrhoids.   No facility-administered encounter medications on file as of 08/22/2017.     Activities of Daily Living In your present state of health, do you have any difficulty performing the following activities: 08/22/2017 12/27/2016  Hearing? Y N  Comment Pt declines referral to see an audiologist or get hearing aids.  -  Vision? N N  Difficulty concentrating or making decisions? N N  Walking or climbing stairs? N N  Dressing or bathing? N N  Doing errands, shopping? N N  Preparing Food and eating ? N -  Using the Toilet? N -  In the past six months, have you accidently leaked urine? N -  Do you have problems with loss of bowel control? N -  Managing your Medications? N -  Managing your Finances? N -  Housekeeping or managing your Housekeeping? N -  Some recent data might be hidden    Patient Care Team: Jerrol Banana., MD as PCP - General (Family Medicine) Carmon Ginsberg, [place] as Referring Physician (Family Medicine) Cammie Sickle, MD as Consulting Physician (Medical Oncology) Noreene Filbert, MD as Referring Physician (Radiation Oncology) Anell Barr, OD as Consulting Physician (Optometry)   Assessment:   This is a routine  wellness examination for Henry [name].  Exercise Activities and Dietary recommendations Current Exercise Habits: Home exercise routine, Type of exercise: walking, Time (Minutes): 30, Frequency (Times/Week): 7, Weekly Exercise (Minutes/Week): 210, Intensity: Mild, Exercise limited by: None identified  Goals    . DIET - INCREASE WATER INTAKE     Recommend increasing water intake to 4-6 glasses a day.        Fall Risk Fall Risk  08/22/2017 05/13/2017 01/26/2017 08/03/2016 05/29/2015  Falls in the past year? No No No No No   Is the patient's  home free of loose throw rugs in walkways, pet beds, electrical cords, etc?   yes      Grab bars in the bathroom? yes      Handrails on the stairs?   no      Adequate lighting?   yes  Timed Get Up and Go Performed: N/A  Depression Screen PHQ 2/9 Scores 08/22/2017 05/13/2017 01/26/2017 08/03/2016  PHQ - 2 Score 0 0 0 0  PHQ- 9 Score - - - 0    Cognitive Function: Pt declined screening today.      6CIT Screen 08/03/2016  What Year? 0 points  What month? 0 points  What time? 0 points  Count back from 20 0 points  Months in reverse 0 points  Repeat phrase 6 points  Total Score 6    Immunization History  Administered Date(s) Administered  . Influenza Split 04/01/2007, 02/20/2010, 03/25/2011, 03/30/2012  . Influenza, High Dose Seasonal PF 02/19/2014, 03/05/2015, 03/18/2016  . Influenza,inj,Quad PF,6+ Mos 02/21/2013, 03/09/2017  . Pneumococcal Conjugate-13 04/24/2013  . Pneumococcal Polysaccharide-23 05/16/2014  . Tdap 01/19/2005  . Zoster 03/30/2012    Qualifies for Shingles Vaccine? Due for Shingles vaccine. Declined my offer to administer today. Education has been provided regarding the importance of this vaccine. Pt has been advised to call her insurance company to determine her out of pocket expense. Advised she may also receive this vaccine at her local pharmacy or Health Dept. Verbalized acceptance and understanding.  Screening Tests Health  Maintenance  Topic Date Due  . TETANUS/TDAP  01/20/2015  . INFLUENZA VACCINE  12/15/2017  . COLONOSCOPY  12/16/2020  . Hepatitis C Screening  Completed  . PNA vac Low Risk Adult  Completed   Cancer Screenings: Lung: Low Dose CT Chest recommended if Age 31-80 years, 30 pack-year currently smoking OR have quit w/in 15years. Patient does not qualify. Colorectal: Up to date  Additional Screenings:  Hepatitis C Screening: Up to date      Plan:  I have personally reviewed and addressed the Medicare Annual Wellness questionnaire and have noted the following in the patient's chart:  A. Medical and social history B. Use of alcohol, tobacco or illicit drugs  C. Current medications and supplements D. Functional ability and status E.  Nutritional status F.  Physical activity G. Advance directives H. List of other physicians I.  Hospitalizations, surgeries, and ER visits in previous 12 months J.  [place] such as hearing and vision if needed, cognitive and depression L. Referrals and appointments - none  In addition, I have reviewed and discussed with patient certain preventive protocols, quality metrics, and best practice recommendations. A written personalized care plan for preventive services as well as general preventive health recommendations were provided to patient.  See attached scanned questionnaire for additional information.   Signed,  Fabio Neighbors, LPN Nurse Health Advisor   Nurse Recommendations: Pt declined the tetanus vaccine today.

## 2017-08-22 NOTE — Patient Instructions (Addendum)
[name] , Thank you for taking time to come for your Medicare Wellness Visit. I appreciate your ongoing commitment to your health goals. Please review the following plan we discussed and let me know if I can assist you in the future.   Screening recommendations/referrals: Colonoscopy: Up to date Recommended yearly ophthalmology/optometry visit for glaucoma screening and checkup Recommended yearly dental visit for hygiene and checkup  Vaccinations: Influenza vaccine: Up to date Pneumococcal vaccine: Up to date Tdap vaccine: Pt declines today.  Shingles vaccine: Pt declines today.     Advanced directives: Please bring a copy of your POA (Power of Attorney) and/or Living Will to your next appointment.   Conditions/risks identified: Recommend increasing water intake to 4-6 glasses a day.   Next appointment: 08/23/17 @ 10 AM with Dr Henry [name].   Preventive Care 40 Years and Older, Male Preventive care refers to lifestyle choices and visits with your health care provider that can promote health and wellness. What does preventive care include?  A yearly physical exam. This is also called an annual well check.  Dental exams once or twice a year.  Routine eye exams. Ask your health care provider how often you should have your eyes checked.  Personal lifestyle choices, including:  Daily care of your teeth and gums.  Regular physical activity.  Eating a healthy diet.  Avoiding tobacco and drug use.  Limiting alcohol use.  Practicing safe sex.  Taking low doses of aspirin every day.  Taking vitamin and mineral supplements as recommended by your health care provider. What happens during an annual well check? The services and screenings done by your health care provider during your annual well check will depend on your age, overall health, lifestyle risk factors, and family history of disease. Counseling  Your health care provider may ask you questions about your:  Alcohol  use.  Tobacco use.  Drug use.  Emotional well-being.  Home and relationship well-being.  Sexual activity.  Eating habits.  History of falls.  Memory and ability to understand (cognition).  Work and work Statistician. Screening  You may have the following tests or measurements:  Height, weight, and BMI.  Blood pressure.  Lipid and cholesterol levels. These may be checked every 5 years, or more frequently if you are over 2 years old.  Skin check.  Lung cancer screening. You may have this screening every year starting at age 51 if you have a 30-pack-year history of smoking and currently smoke or have quit within the past 15 years.  Fecal occult blood test (FOBT) of the stool. You may have this test every year starting at age 22.  Flexible sigmoidoscopy or colonoscopy. You may have a sigmoidoscopy every 5 years or a colonoscopy every 10 years starting at age 28.  Prostate cancer screening. Recommendations will vary depending on your family history and other risks.  Hepatitis C blood test.  Hepatitis B blood test.  Sexually transmitted disease (STD) testing.  Diabetes screening. This is done by checking your blood sugar (glucose) after you have not eaten for a while (fasting). You may have this done every 1-3 years.  Abdominal aortic aneurysm (AAA) screening. You may need this if you are a current or former smoker.  Osteoporosis. You may be screened starting at age 17 if you are at high risk. Talk with your health care provider about your test results, treatment options, and if necessary, the need for more tests. Vaccines  Your health care provider may recommend certain vaccines, such  as:  Influenza vaccine. This is recommended every year.  Tetanus, diphtheria, and acellular pertussis (Tdap, Td) vaccine. You may need a Td booster every 10 years.  Zoster vaccine. You may need this after age 11.  Pneumococcal 13-valent conjugate (PCV13) vaccine. One dose is  recommended after age 36.  Pneumococcal polysaccharide (PPSV23) vaccine. One dose is recommended after age 64. Talk to your health care provider about which screenings and vaccines you need and how often you need them. This information is not intended to replace advice given to you by your health care provider. Make sure you discuss any questions you have with your health care provider. Document Released: 05/30/2015 Document Revised: 01/21/2016 Document Reviewed: 03/04/2015 Elsevier Interactive Patient Education  2017 [place] Prevention in the Home Falls can cause injuries. They can happen to people of all ages. There are many things you can do to make your home safe and to help prevent falls. What can I do on the outside of my home?  Regularly fix the edges of walkways and driveways and fix any cracks.  Remove anything that might make you trip as you walk through a door, such as a raised step or threshold.  Trim any bushes or trees on the path to your home.  Use bright outdoor lighting.  Clear any walking paths of anything that might make someone trip, such as rocks or tools.  Regularly check to see if handrails are loose or broken. Make sure that both sides of any steps have handrails.  Any raised decks and porches should have guardrails on the edges.  Have any leaves, snow, or ice cleared regularly.  Use sand or salt on walking paths during winter.  Clean up any spills in your garage right away. This includes oil or grease spills. What can I do in the bathroom?  Use night lights.  Install grab bars by the toilet and in the tub and shower. Do not use towel bars as grab bars.  Use non-skid mats or decals in the tub or shower.  If you need to sit down in the shower, use a plastic, non-slip stool.  Keep the floor dry. Clean up any water that spills on the floor as soon as it happens.  Remove soap buildup in the tub or shower regularly.  Attach bath mats  securely with double-sided non-slip rug tape.  Do not have throw rugs and other things on the floor that can make you trip. What can I do in the bedroom?  Use night lights.  Make sure that you have a light by your bed that is easy to reach.  Do not use any sheets or blankets that are too big for your bed. They should not hang down onto the floor.  Have a firm chair that has side arms. You can use this for support while you get dressed.  Do not have throw rugs and other things on the floor that can make you trip. What can I do in the kitchen?  Clean up any spills right away.  Avoid walking on wet floors.  Keep items that you use a lot in easy-to-reach places.  If you need to reach something above you, use a strong step stool that has a grab bar.  Keep electrical cords out of the way.  Do not use floor polish or wax that makes floors slippery. If you must use wax, use non-skid floor wax.  Do not have throw rugs and other things on the  floor that can make you trip. What can I do with my stairs?  Do not leave any items on the stairs.  Make sure that there are handrails on both sides of the stairs and use them. Fix handrails that are broken or loose. Make sure that handrails are as long as the stairways.  Check any carpeting to make sure that it is firmly attached to the stairs. Fix any carpet that is loose or worn.  Avoid having throw rugs at the top or bottom of the stairs. If you do have throw rugs, attach them to the floor with carpet tape.  Make sure that you have a light switch at the top of the stairs and the bottom of the stairs. If you do not have them, ask someone to add them for you. What else can I do to help prevent falls?  Wear shoes that:  Do not have high heels.  Have rubber bottoms.  Are comfortable and fit you well.  Are closed at the toe. Do not wear sandals.  If you use a stepladder:  Make sure that it is fully opened. Do not climb a closed  stepladder.  Make sure that both sides of the stepladder are locked into place.  Ask someone to hold it for you, if possible.  Clearly mark and make sure that you can see:  Any grab bars or handrails.  First and last steps.  Where the edge of each step is.  Use tools that help you move around (mobility aids) if they are needed. These include:  Canes.  Walkers.  Scooters.  Crutches.  Turn on the lights when you go into a dark area. Replace any light bulbs as soon as they burn out.  Set up your furniture so you have a clear path. Avoid moving your furniture around.  If any of your floors are uneven, fix them.  If there are any pets around you, be aware of where they are.  Review your medicines with your doctor. Some medicines can make you feel dizzy. This can increase your chance of falling. Ask your doctor what other things that you can do to help prevent falls. This information is not intended to replace advice given to you by your health care provider. Make sure you discuss any questions you have with your health care provider. Document Released: 02/27/2009 Document Revised: 10/09/2015 Document Reviewed: 06/07/2014 Elsevier Interactive Patient Education  2017 [employer].

## 2017-08-23 ENCOUNTER — Ambulatory Visit (INDEPENDENT_AMBULATORY_CARE_PROVIDER_SITE_OTHER): Payer: Medicare Other | Admitting: Family Medicine

## 2017-08-23 ENCOUNTER — Other Ambulatory Visit: Payer: Self-pay

## 2017-08-23 VITALS — BP 142/78 | HR 60 | Temp 98.0°F | Resp 16 | Ht 73.0 in | Wt 220.0 lb

## 2017-08-23 DIAGNOSIS — I10 Essential (primary) hypertension: Secondary | ICD-10-CM | POA: Diagnosis not present

## 2017-08-23 DIAGNOSIS — C8334 Diffuse large B-cell lymphoma, lymph nodes of axilla and upper limb: Secondary | ICD-10-CM | POA: Diagnosis not present

## 2017-08-23 DIAGNOSIS — Z0000 Encounter for general adult medical examination without abnormal findings: Secondary | ICD-10-CM

## 2017-08-23 DIAGNOSIS — E7800 Pure hypercholesterolemia, unspecified: Secondary | ICD-10-CM

## 2017-08-23 NOTE — Progress Notes (Signed)
Patient: Henry [name], Male    DOB: 1946-03-31, 72 y.o.   MRN: [id number] Visit Date: 08/23/2017  Today's Provider: Wilhemena Durie, MD   Chief Complaint  Patient presents with  . Annual Exam   Subjective:  Henry [name] is a 72 y.o. male who presents today for health maintenance and complete physical. He feels well. He reports exercising daily. He reports he is sleeping well. Overall feels well. Chemo left some neuropathy of finger tips and a bad taste in mouth which is resolving.   Immunization History  Administered Date(s) Administered  . Influenza Split 04/01/2007, 02/20/2010, 03/25/2011, 03/30/2012  . Influenza, High Dose Seasonal PF 02/19/2014, 03/05/2015, 03/18/2016  . Influenza,inj,Quad PF,6+ Mos 02/21/2013, 03/09/2017  . Pneumococcal Conjugate-13 04/24/2013  . Pneumococcal Polysaccharide-23 05/16/2014  . Tdap 01/19/2005  . Zoster 03/30/2012   10/17/2010 Colonoscopy, [place]-normal, repeat 5-10 years  Review of Systems  Constitutional: Negative.   HENT: Positive for hearing loss and tinnitus.   Eyes: Negative.   Respiratory: Negative.   Cardiovascular: Negative.   Gastrointestinal: Negative.   Endocrine: Negative.   Genitourinary: Negative.   Musculoskeletal: Negative.   Skin: Negative.   Allergic/Immunologic: Negative.   Neurological: Positive for numbness.  Hematological: Negative.   Psychiatric/Behavioral: Negative.     Social History   Socioeconomic History  . Marital status: Married    Spouse name: Not on file  . Number of children: 2  . Years of education: Not on file  . Highest education level: Some college, no degree  Occupational History  . Occupation: retired  Scientific laboratory technician  . Financial resource strain: Not hard at all  . Food insecurity:    Worry: Never true    Inability: Never true  . Transportation needs:    Medical: No    Non-medical: No  Tobacco Use  . Smoking status: Never Smoker  . Smokeless tobacco: Never Used  Substance and  Sexual Activity  . Alcohol use: Yes    Alcohol/week: 3.0 oz    Types: 5 Cans of beer per week  . Drug use: No  . Sexual activity: Not on file  Lifestyle  . Physical activity:    Days per week: Not on file    Minutes per session: Not on file  . Stress: Not at all  Relationships  . Social connections:    Talks on phone: Not on file    Gets together: Not on file    Attends religious service: Not on file    Active member of club or organization: Not on file    Attends meetings of clubs or organizations: Not on file    Relationship status: Not on file  . Intimate partner violence:    Fear of current or ex partner: Not on file    Emotionally abused: Not on file    Physically abused: Not on file    Forced sexual activity: Not on file  Other Topics Concern  . Not on file  Social History Narrative  . Not on file    Patient Active Problem List   Diagnosis Date Noted  . 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 Low back pain 03/12/2015  . Allergic rhinitis 09/23/2014  . 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 Acid reflux 09/23/2014  . Hypercholesteremia 09/23/2014  . BP (high blood pressure) 09/23/2014  . Arthritis, degenerative 09/23/2014  . Basal cell papilloma 09/23/2014  . Drug intolerance 09/23/2014  . Avitaminosis D 09/23/2014  .  Essential (primary) hypertension 06/04/2014  . Hemorrhoid 06/04/2014  . Adaptive colitis 06/04/2014  . Muscle ache 06/04/2014  . Excess weight 06/04/2014  . Hypercholesterolemia without hypertriglyceridemia 06/04/2014    Past Surgical History:  Procedure Laterality Date  . CATARACT EXTRACTION Bilateral   . MOLE REMOVAL    . NECK MASS EXCISION    . PORTACATH PLACEMENT N/A 12/29/2016   Procedure: INSERTION PORT-A-CATH;  Surgeon: Christene Lye, MD;  Location: [facility] ORS;  Service: General;  Laterality: N/A;  . TONSILLECTOMY      His family history includes CAD in his mother; COPD in his sister;  Heart attack in his father; Heart disease in his mother; Rheumatic fever in his mother.     Outpatient Encounter Medications as of 08/23/2017  Medication Sig  . amLODipine (NORVASC) 5 MG tablet TAKE ONE TABLET BY MOUTH DAILY  . aspirin EC 81 MG tablet Take 81 mg by mouth daily.  . cetirizine (ZYRTEC) 10 MG tablet Take 10 mg by mouth daily.  . cholecalciferol (VITAMIN D) 1000 UNITS tablet Take 1,000 Units by mouth daily.   Marland Kitchen docusate sodium (COLACE) 100 MG capsule Take 200 mg by mouth daily.   Marland Kitchen ibuprofen (ADVIL,MOTRIN) 200 MG tablet Take 800 mg by mouth every 8 (eight) hours as needed (for pain).  Marland Kitchen lidocaine-prilocaine (EMLA) cream Apply 1 application topically as needed. To port a cath  . lisinopril (PRINIVIL,ZESTRIL) 40 MG tablet TAKE 1 TABLET (40 MG TOTAL) BY MOUTH DAILY.  Marland Kitchen omeprazole (PRILOSEC) 20 MG capsule TAKE 1 CAPSULE (20 MG TOTAL) BY MOUTH DAILY.  Marland Kitchen phenylephrine-shark liver oil-mineral oil-petrolatum (PREPARATION H) 0.25-14-74.9 % rectal ointment Place 1 application rectally 2 (two) times daily as needed for hemorrhoids.   No facility-administered encounter medications on file as of 08/23/2017.     Patient Care Team: Jerrol Banana., MD as PCP - General (Family Medicine) Carmon Ginsberg, [place] as Referring Physician (Family Medicine) Cammie Sickle, MD as Consulting Physician (Medical Oncology) Noreene Filbert, MD as Referring Physician (Radiation Oncology) Anell Barr, OD as Consulting Physician (Optometry)      Objective:   Vitals:  Vitals:   08/23/17 1002  BP: (!) 142/78  Pulse: 60  Resp: 16  Temp: 98 F (36.7 C)  TempSrc: Oral  Weight: 220 lb (99.8 kg)  Height: 6\' 1"  (1.854 m)    Physical Exam  Constitutional: He is oriented to person, place, and time. He appears well-developed and well-nourished.  HENT:  Head: Normocephalic and atraumatic.  Right Ear: External ear normal.  Left Ear: External ear normal.  Nose: Nose normal.  Mouth/Throat:  Oropharynx is clear and moist.  Eyes: Pupils are equal, round, and reactive to light. Conjunctivae and EOM are normal.  Neck: Normal range of motion. Neck supple.  Cardiovascular: Normal rate, regular rhythm, normal heart sounds and intact distal pulses.  Pulmonary/Chest: Effort normal and breath sounds normal.  Abdominal: Soft. Bowel sounds are normal.  Genitourinary: Rectum normal, prostate normal and penis normal.  Musculoskeletal: Normal range of motion.  Neurological: He is alert and oriented to person, place, and time.  Skin: Skin is warm and dry.  Psychiatric: He has a normal mood and affect. His behavior is normal. Judgment and thought content normal.    Assessment & Plan:     Routine Health Maintenance and Physical Exam  Exercise Activities and Dietary recommendations Goals    . DIET - INCREASE WATER INTAKE     Recommend increasing water intake to 4-6 glasses a  day.        Immunization History  Administered Date(s) Administered  . Influenza Split 04/01/2007, 02/20/2010, 03/25/2011, 03/30/2012  . Influenza, High Dose Seasonal PF 02/19/2014, 03/05/2015, 03/18/2016  . Influenza,inj,Quad PF,6+ Mos 02/21/2013, 03/09/2017  . Pneumococcal Conjugate-13 04/24/2013  . Pneumococcal Polysaccharide-23 05/16/2014  . Tdap 01/19/2005  . Zoster 03/30/2012    Health Maintenance  Topic Date Due  . TETANUS/TDAP  01/20/2015  . INFLUENZA VACCINE  12/15/2017  . COLONOSCOPY  12/16/2020  . Hepatitis C Screening  Completed  . PNA vac Low Risk Adult  Completed     Discussed health benefits of physical activity, and encouraged him to engage in regular exercise appropriate for his age and condition.  Needs dT vaccine. Hemorrhoid Sitz baths.stool softener. Large Cell Lymphoma  I have done the exam and reviewed the chart and it is accurate to the best of my knowledge. Development worker, community has been used and  any errors in dictation or transcription are unintentional. Miguel Aschoff  M.D. [facility]

## 2017-08-24 LAB — COMPREHENSIVE METABOLIC PANEL
ALT: 14 IU/L (ref 0–44)
AST: 15 IU/L (ref 0–40)
Albumin/Globulin Ratio: 2 (ref 1.2–2.2)
Albumin: 4.4 g/dL (ref 3.6–4.8)
Alkaline Phosphatase: 68 IU/L (ref 39–117)
BUN/Creatinine Ratio: 14 (ref 10–24)
BUN: 17 mg/dL (ref 8–27)
Bilirubin Total: 0.6 mg/dL (ref 0.0–1.2)
CO2: 21 mmol/L (ref 20–29)
Calcium: 9.4 mg/dL (ref 8.6–10.2)
Chloride: 106 mmol/L (ref 96–106)
Creatinine, Ser: 1.19 mg/dL (ref 0.76–1.27)
GFR calc Af Amer: 72 mL/min/{1.73_m2} (ref >59)
GFR calc non Af Amer: 62 mL/min/{1.73_m2} (ref >59)
GLOBULIN, TOTAL: 2.2 g/dL (ref 1.5–4.5)
Glucose: 90 mg/dL (ref 65–99)
POTASSIUM: 4.4 mmol/L (ref 3.5–5.2)
SODIUM: 142 mmol/L (ref 134–144)
Total Protein: 6.6 g/dL (ref 6.0–8.5)

## 2017-08-24 LAB — CBC WITH DIFFERENTIAL/PLATELET
BASOS: 1 %
Basophils Absolute: 0.1 10*3/uL (ref 0.0–0.2)
EOS (ABSOLUTE): 0.3 10*3/uL (ref 0.0–0.4)
EOS: 6 %
HEMATOCRIT: 42.1 % (ref 37.5–51.0)
Hemoglobin: 14 g/dL (ref 13.0–17.7)
Immature Grans (Abs): 0 10*3/uL (ref 0.0–0.1)
Immature Granulocytes: 0 %
LYMPHS ABS: 0.8 10*3/uL (ref 0.7–3.1)
Lymphs: 15 %
MCH: 31 pg (ref 26.6–33.0)
MCHC: 33.3 g/dL (ref 31.5–35.7)
MCV: 93 fL (ref 79–97)
MONOS ABS: 0.5 10*3/uL (ref 0.1–0.9)
Monocytes: 9 %
NEUTROS ABS: 3.7 10*3/uL (ref 1.4–7.0)
Neutrophils: 69 %
Platelets: 217 10*3/uL (ref 150–379)
RBC: 4.51 x10E6/uL (ref 4.14–5.80)
RDW: 14.7 % (ref 12.3–15.4)
WBC: 5.4 10*3/uL (ref 3.4–10.8)

## 2017-08-24 LAB — LIPID PANEL WITH LDL/HDL RATIO
Cholesterol, Total: 209 mg/dL — ABNORMAL HIGH (ref 100–199)
HDL: 37 mg/dL — AB (ref >39)
LDL CALC: 153 mg/dL — AB (ref 0–99)
LDL/HDL RATIO: 4.1 ratio — AB (ref 0.0–3.6)
Triglycerides: 95 mg/dL (ref 0–149)
VLDL CHOLESTEROL CAL: 19 mg/dL (ref 5–40)

## 2017-08-24 LAB — TSH: TSH: 1.93 u[IU]/mL (ref 0.450–4.500)

## 2017-09-07 ENCOUNTER — Encounter: Payer: Self-pay | Admitting: Internal Medicine

## 2017-09-07 ENCOUNTER — Inpatient Hospital Stay: Payer: Medicare Other | Attending: Internal Medicine

## 2017-09-07 ENCOUNTER — Inpatient Hospital Stay (HOSPITAL_BASED_OUTPATIENT_CLINIC_OR_DEPARTMENT_OTHER): Payer: Medicare Other | Admitting: Internal Medicine

## 2017-09-07 VITALS — BP 144/92 | HR 59 | Temp 98.4°F | Resp 16 | Wt 220.0 lb

## 2017-09-07 DIAGNOSIS — Z95828 Presence of other vascular implants and grafts: Secondary | ICD-10-CM

## 2017-09-07 DIAGNOSIS — Z923 Personal history of irradiation: Secondary | ICD-10-CM | POA: Insufficient documentation

## 2017-09-07 DIAGNOSIS — C8334 Diffuse large B-cell lymphoma, lymph nodes of axilla and upper limb: Secondary | ICD-10-CM | POA: Insufficient documentation

## 2017-09-07 DIAGNOSIS — N189 Chronic kidney disease, unspecified: Secondary | ICD-10-CM | POA: Diagnosis not present

## 2017-09-07 DIAGNOSIS — G620 Drug-induced polyneuropathy: Secondary | ICD-10-CM | POA: Insufficient documentation

## 2017-09-07 DIAGNOSIS — T451X5A Adverse effect of antineoplastic and immunosuppressive drugs, initial encounter: Secondary | ICD-10-CM

## 2017-09-07 DIAGNOSIS — I129 Hypertensive chronic kidney disease with stage 1 through stage 4 chronic kidney disease, or unspecified chronic kidney disease: Secondary | ICD-10-CM | POA: Diagnosis not present

## 2017-09-07 LAB — COMPREHENSIVE METABOLIC PANEL
ALK PHOS: 64 U/L (ref 38–126)
ALT: 16 U/L — AB (ref 17–63)
AST: 19 U/L (ref 15–41)
Albumin: 3.9 g/dL (ref 3.5–5.0)
Anion gap: 7 (ref 5–15)
BUN: 20 mg/dL (ref 6–20)
CALCIUM: 9 mg/dL (ref 8.9–10.3)
CO2: 23 mmol/L (ref 22–32)
CREATININE: 1.25 mg/dL — AB (ref 0.61–1.24)
Chloride: 108 mmol/L (ref 101–111)
GFR calc Af Amer: >60 mL/min (ref >60)
GFR calc non Af Amer: 57 mL/min — ABNORMAL LOW (ref >60)
Glucose, Bld: 88 mg/dL (ref 65–99)
Potassium: 4.1 mmol/L (ref 3.5–5.1)
SODIUM: 138 mmol/L (ref 135–145)
Total Bilirubin: 0.7 mg/dL (ref 0.3–1.2)
Total Protein: 6.7 g/dL (ref 6.5–8.1)

## 2017-09-07 LAB — CBC WITH DIFFERENTIAL/PLATELET
BASOS PCT: 0 %
Basophils Absolute: 0 10*3/uL (ref 0–0.1)
EOS ABS: 0.4 10*3/uL (ref 0–0.7)
EOS PCT: 7 %
HCT: 39.1 % — ABNORMAL LOW (ref 40.0–52.0)
HEMOGLOBIN: 13.6 g/dL (ref 13.0–18.0)
Lymphocytes Relative: 15 %
Lymphs Abs: 0.8 10*3/uL — ABNORMAL LOW (ref 1.0–3.6)
MCH: 32.3 pg (ref 26.0–34.0)
MCHC: 34.9 g/dL (ref 32.0–36.0)
MCV: 92.4 fL (ref 80.0–100.0)
MONO ABS: 0.5 10*3/uL (ref 0.2–1.0)
MONOS PCT: 10 %
NEUTROS PCT: 68 %
Neutro Abs: 3.5 10*3/uL (ref 1.4–6.5)
PLATELETS: 181 10*3/uL (ref 150–440)
RBC: 4.23 MIL/uL — ABNORMAL LOW (ref 4.40–5.90)
RDW: 14.2 % (ref 11.5–14.5)
WBC: 5.2 10*3/uL (ref 3.8–10.6)

## 2017-09-07 LAB — LACTATE DEHYDROGENASE: LDH: 132 U/L (ref 98–192)

## 2017-09-07 MED ORDER — HEPARIN SOD (PORK) LOCK FLUSH 100 UNIT/ML IV SOLN
500.0000 [IU] | INTRAVENOUS | Status: AC | PRN
  Administered 2017-09-07: 500 [IU]

## 2017-09-07 MED ORDER — SODIUM CHLORIDE 0.9% FLUSH
10.0000 mL | INTRAVENOUS | Status: AC | PRN
  Administered 2017-09-07: 10 mL
  Filled 2017-09-07: qty 10

## 2017-09-07 NOTE — Progress Notes (Signed)
[facility] NOTE  Patient Care Team: Jerrol Banana., MD as PCP - General (Family Medicine) Carmon Ginsberg, [place] as Referring Physician (Family Medicine) Cammie Sickle, MD as Consulting Physician (Medical Oncology) Noreene Filbert, MD as Referring Physician (Radiation Oncology) Anell Barr, OD as Consulting Physician (Optometry)  CHIEF COMPLAINTS/PURPOSE OF CONSULTATION:  Lymphoma  #  Oncology History   # AUG 6th 2018- DLBCL STAGE I [LEFT axilla- Core Bx- Dr.Sankar; CD-20, bcl-2;bcl-6 positive]; Ki- 67 >90%. MUM-1 NEG. GCB subtype; Myc- POS; FISH studies-NEGATIVE for gene RE-ARRANGEMENTS [positive for extra-copies of bcl-6/ myc/IgH/bcl-2]PET scan- Right Ax LN [4.5cm; SUV 35]  # R-CHOP [aug 20th 2018] x3 cycles; RT [starting 10/25]; DEc 2018- CR ; except 8 mm left ax LN- no uptake.   # AUG 13th 2018. MUGA scan-56%.      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  male with diffuse large B-cell lymphoma stage I currently status post cycle #3 of R- CHOP chemotherapy; followed by involved field radiation finished [finished Nov 2018] .  Patient states significant improvement of the mild numbness in his hands.  Denies any worsening lumps or bumps.  He denies any nausea vomiting. Denies any chills. No night sweats.   ROS: A complete 10 point review of system is done which is negative except mentioned above in history of present illness  MEDICAL HISTORY:  Past Medical History:  Diagnosis Date  . Allergy   . Arthritis   . Cancer ([place])    left axillary  . Cataract   . GERD (gastroesophageal reflux disease)   . Hyperlipidemia   . Hypertension     SURGICAL HISTORY: Past Surgical History:  Procedure Laterality Date  . CATARACT EXTRACTION Bilateral   . MOLE REMOVAL    . NECK MASS EXCISION    . PORTACATH PLACEMENT N/A 12/29/2016   Procedure: INSERTION PORT-A-CATH;   Surgeon: Christene Lye, MD;  Location: [facility] ORS;  Service: General;  Laterality: N/A;  . TONSILLECTOMY      SOCIAL HISTORY:lives in [place], never smoked; 2 beers/ day. 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  Social History   Socioeconomic History  . Marital status: Married    Spouse name: Not on file  . Number of children: 2  . Years of education: Not on file  . Highest education level: Some college, no degree  Occupational History  . Occupation: retired  Scientific laboratory technician  . Financial resource strain: Not hard at all  . Food insecurity:    Worry: Never true    Inability: Never true  . Transportation needs:    Medical: No    Non-medical: No  Tobacco Use  . Smoking status: Never Smoker  . Smokeless tobacco: Never Used  Substance and Sexual Activity  . Alcohol use: Yes    Alcohol/week: 3.0 oz    Types: 5 Cans of beer per week  . Drug use: No  . Sexual activity: Not on file  Lifestyle  . Physical activity:    Days per week: Not on file    Minutes per session: Not on file  . Stress: Not at all  Relationships  . Social connections:    Talks on phone: Not on file    Gets together: Not on file    Attends religious service: Not on file    Active member of club or organization: Not on file  Attends meetings of clubs or organizations: Not on file    Relationship status: Not on file  . Intimate partner violence:    Fear of current or ex partner: Not on file    Emotionally abused: Not on file    Physically abused: Not on file    Forced sexual activity: Not on file  Other Topics Concern  . Not on file  Social History Narrative  . Not on file    FAMILY HISTORY: Family History  Problem Relation Age of Onset  . Heart disease Mother   . Rheumatic fever Mother   . CAD Mother   . Heart attack Father   . COPD Sister     ALLERGIES:  is allergic to lovastatin; pravastatin; pravastatin sodium; simvastatin; statins; and sulfa antibiotics.  MEDICATIONS:   Current Outpatient Medications  Medication Sig Dispense Refill  . amLODipine (NORVASC) 5 MG tablet TAKE ONE TABLET BY MOUTH DAILY 90 tablet 3  . aspirin EC 81 MG tablet Take 81 mg by mouth daily.    . cetirizine (ZYRTEC) 10 MG tablet Take 10 mg by mouth daily.    . cholecalciferol (VITAMIN D) 1000 UNITS tablet Take 1,000 Units by mouth daily.     Marland Kitchen docusate sodium (COLACE) 100 MG capsule Take 200 mg by mouth daily.     Marland Kitchen ibuprofen (ADVIL,MOTRIN) 200 MG tablet Take 800 mg by mouth every 8 (eight) hours as needed (for pain).    Marland Kitchen lidocaine-prilocaine (EMLA) cream Apply 1 application topically as needed. To port a cath 30 g 3  . lisinopril (PRINIVIL,ZESTRIL) 40 MG tablet TAKE 1 TABLET (40 MG TOTAL) BY MOUTH DAILY. 90 tablet 3  . omeprazole (PRILOSEC) 20 MG capsule TAKE 1 CAPSULE (20 MG TOTAL) BY MOUTH DAILY. 90 capsule 2  . phenylephrine-shark liver oil-mineral oil-petrolatum (PREPARATION H) 0.25-14-74.9 % rectal ointment Place 1 application rectally 2 (two) times daily as needed for hemorrhoids.     No current facility-administered medications for this visit.       Marland Kitchen  PHYSICAL EXAMINATION: ECOG PERFORMANCE STATUS: 1 - Symptomatic but completely ambulatory  Vitals:   09/07/17 1049 09/07/17 1053  BP:  (!) 144/92  Pulse:  (!) 59  Resp: (!) 1 16  Temp:  98.4 F (36.9 C)   Filed Weights   09/07/17 1053  Weight: 220 lb (99.8 kg)    GENERAL: Well-nourished well-developed; Alert, no distress and comfortable. He is alone. EYES: no pallor or icterus OROPHARYNX: no thrush or ulceration; good dentition  NECK: supple, no masses felt LYMPH:  no palpable lymphadenopathy in the cervical,  or inguinal regions. Soft vague/palpable mass noted in the left axilla ~ 1 cm in size [improved] LUNGS: clear to auscultation and  No wheeze or crackles HEART/CVS: regular rate & rhythm and no murmurs; No lower extremity edema ABDOMEN: abdomen soft, non-tender and normal bowel sounds Musculoskeletal:no  cyanosis of digits and no clubbing  PSYCH: alert & oriented x 3 with fluent speech NEURO: no focal motor/sensory deficits SKIN:  no rashes or significant lesions  LABORATORY DATA:  I have reviewed the data as listed Lab Results  Component Value Date   WBC 5.2 09/07/2017   HGB 13.6 09/07/2017   HCT 39.1 (L) 09/07/2017   MCV 92.4 09/07/2017   PLT 181 09/07/2017   Recent Labs    05/13/17 0914 08/23/17 1128 09/07/17 1035  NA 137 142 138  K 3.9 4.4 4.1  CL 107 106 108  CO2 _0 GLUCOSE 142*  90 88  BUN 22* 17 20  CREATININE 1.23 1.19 1.25*  CALCIUM 9.2 9.4 9.0  GFRNONAA 58* 62 57*  GFRAA >60 72 >60  PROT 6.9 6.6 6.7  ALBUMIN 3.8 4.4 3.9  AST _0 ALT 14* 14 16*  ALKPHOS 63 68 64  BILITOT 0.5 0.6 0.7    RADIOGRAPHIC STUDIES: I have personally reviewed the radiological images as listed and agreed with the findings in the report. No results found.  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 Patient currently s/p  R-CHOP s/p cycle #3; s/p IFRT 10/25-start. DEC 2018- PET- CR- except for 1cm LN [on exam-left underarm]; monitor for now.    #Patient continues to have a approximately 1 cm lymph node in the left underarm; this is not getting any worse.  Monitor closely.  If it gets worse recommend repeat imaging.   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  # CKD stage I creatinine 1.2-1.3-/recommend monitoring blood pressure closely/at home-if elevated; to bring to PCPs attention  # port flush in 8 weeks; will keep the port for now; will recommend explantation of the port if no progression by the end of the year.   # port flush in 2 months; 4 month labs/MD; scan clinically indicated  All questions were answered. The patient knows to call the clinic with any problems, questions or concerns.    Cammie Sickle, MD 09/07/2017 11:23 AM

## 2017-09-07 NOTE — Assessment & Plan Note (Addendum)
Diffuse large B-cell lymphoma- stage I. Patient currently s/p  R-CHOP s/p cycle #3; s/p IFRT 10/25-start. DEC 2018- PET- CR- except for 1cm LN [on exam-left underarm]; monitor for now.    #Patient continues to have a approximately 1 cm lymph node in the left underarm; this is not getting any worse.  Monitor closely.  If it gets worse recommend repeat imaging.   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  # CKD stage I creatinine 1.2-1.3-/recommend monitoring blood pressure closely/at home-if elevated; to bring to PCPs attention  # port flush in 8 weeks; will keep the port for now; will recommend explantation of the port if no progression by the end of the year.   # port flush in 2 months; 4 month labs/MD; scan clinically indicated

## 2017-09-08 ENCOUNTER — Encounter: Payer: Self-pay | Admitting: *Deleted

## 2017-10-28 ENCOUNTER — Ambulatory Visit: Payer: Self-pay | Admitting: Radiation Oncology

## 2017-11-04 ENCOUNTER — Encounter: Payer: Self-pay | Admitting: Radiation Oncology

## 2017-11-04 ENCOUNTER — Ambulatory Visit
Admission: RE | Admit: 2017-11-04 | Discharge: 2017-11-04 | Disposition: A | Discharge status: Home / Self Care | Payer: Medicare Other | Source: Ambulatory Visit | Attending: Radiation Oncology | Admitting: Radiation Oncology

## 2017-11-04 ENCOUNTER — Other Ambulatory Visit: Payer: Self-pay

## 2017-11-04 VITALS — BP 128/80 | HR 81 | Temp 97.0°F | Resp 20 | Wt 218.5 lb

## 2017-11-04 DIAGNOSIS — Z923 Personal history of irradiation: Secondary | ICD-10-CM | POA: Diagnosis not present

## 2017-11-04 DIAGNOSIS — Z9221 Personal history of antineoplastic chemotherapy: Secondary | ICD-10-CM | POA: Diagnosis not present

## 2017-11-04 DIAGNOSIS — Z8572 Personal history of non-Hodgkin lymphomas: Secondary | ICD-10-CM | POA: Insufficient documentation

## 2017-11-04 DIAGNOSIS — C8334 Diffuse large B-cell lymphoma, lymph nodes of axilla and upper limb: Secondary | ICD-10-CM

## 2017-11-04 NOTE — Progress Notes (Signed)
Radiation Oncology Follow up Note  Name: Henry [name]   Date:   11/04/2017 MRN:  [id number] DOB: Oct 21, 1945    This 72 y.o. male presents to the clinic today for  Month follow-up status post involved field radiation therapy to his left axilla for B-cell lymphoma diffuse large cell type.  REFERRING PROVIDER: Jerrol Banana.,*  HPI: patient is a 72 year old male now out 6 months having completed involved field radiation therapy to his left axilla for diffuse large B-cell lymphoma stage Istatus post R CHOP chemotherapy seen today in routine follow-up he is doing well. He specifically denies fever chills night sweats or weight loss. He has no pain in his axilla..patient did have repeat PET CT scan back in December which I have reviewed showing minimal residual nodal tissue with minimal SUV. This represents De[place] 2 activity  COMPLICATIONS OF TREATMENT: none  FOLLOW UP COMPLIANCE: keeps appointments   PHYSICAL EXAM:  BP 128/80   Pulse 81   Temp (!) 97 F (36.1 C)   Resp 20   Wt 218 lb 7.6 oz (99.1 kg)   BMI 28.82 kg/m  No evidence of peripheral adenopathy is identified. Left axilla was completely normal. Well-developed well-nourished patient in NAD. HEENT reveals PERLA, EOMI, discs not visualized.  Oral cavity is clear. No oral mucosal lesions are identified. Neck is clear without evidence of cervical or supraclavicular adenopathy. Lungs are clear to A&P. Cardiac examination is essentially unremarkable with regular rate and rhythm without murmur rub or thrill. Abdomen is benign with no organomegaly or masses noted. Motor sensory and DTR levels are equal and symmetric in the upper and lower extremities. Cranial nerves II through XII are grossly intact. Proprioception is intact. No peripheral adenopathy or edema is identified. No motor or sensory levels are noted. Crude visual fields are within normal range.  RADIOLOGY RESULTS: PET CT scan reviewed and compatible with the  above-stated findings  PLAN: present time patient continues to do well with no evidence of disease. I'm please was overall progress. He continues close follow-up care with Dr. Ezzie Dural. I have asked to see her out in 6 months and will start once your follow-up visits. I Dr. Ezzie Dural will be ordering a follow-up PET/CT in the future which I will review when available. Patient knows to call with any concerns.  I would like to take this opportunity to thank you for allowing me to participate in the care of your patient.Noreene Filbert, MD

## 2017-11-07 ENCOUNTER — Inpatient Hospital Stay: Payer: Medicare Other | Attending: Internal Medicine

## 2017-11-07 DIAGNOSIS — C8334 Diffuse large B-cell lymphoma, lymph nodes of axilla and upper limb: Secondary | ICD-10-CM | POA: Insufficient documentation

## 2017-11-07 DIAGNOSIS — Z452 Encounter for adjustment and management of vascular access device: Secondary | ICD-10-CM | POA: Insufficient documentation

## 2017-11-07 DIAGNOSIS — Z95828 Presence of other vascular implants and grafts: Secondary | ICD-10-CM

## 2017-11-07 MED ORDER — SODIUM CHLORIDE 0.9% FLUSH
10.0000 mL | INTRAVENOUS | Status: DC | PRN
  Administered 2017-11-07: 10 mL via INTRAVENOUS
  Filled 2017-11-07: qty 10

## 2017-11-07 MED ORDER — HEPARIN SOD (PORK) LOCK FLUSH 100 UNIT/ML IV SOLN
500.0000 [IU] | Freq: Once | INTRAVENOUS | Status: AC
  Administered 2017-11-07: 500 [IU] via INTRAVENOUS
  Filled 2017-11-07: qty 5

## 2018-01-09 ENCOUNTER — Inpatient Hospital Stay: Payer: Medicare Other | Attending: Internal Medicine

## 2018-01-09 ENCOUNTER — Inpatient Hospital Stay (HOSPITAL_BASED_OUTPATIENT_CLINIC_OR_DEPARTMENT_OTHER): Payer: Medicare Other | Admitting: Internal Medicine

## 2018-01-09 VITALS — BP 115/77 | HR 64 | Temp 97.1°F | Resp 16

## 2018-01-09 DIAGNOSIS — G629 Polyneuropathy, unspecified: Secondary | ICD-10-CM | POA: Diagnosis not present

## 2018-01-09 DIAGNOSIS — N189 Chronic kidney disease, unspecified: Secondary | ICD-10-CM

## 2018-01-09 DIAGNOSIS — C8334 Diffuse large B-cell lymphoma, lymph nodes of axilla and upper limb: Secondary | ICD-10-CM | POA: Insufficient documentation

## 2018-01-09 DIAGNOSIS — I10 Essential (primary) hypertension: Secondary | ICD-10-CM

## 2018-01-09 DIAGNOSIS — Z95828 Presence of other vascular implants and grafts: Secondary | ICD-10-CM

## 2018-01-09 LAB — COMPREHENSIVE METABOLIC PANEL
ALBUMIN: 3.9 g/dL (ref 3.5–5.0)
ALT: 14 U/L (ref 0–44)
AST: 18 U/L (ref 15–41)
Alkaline Phosphatase: 61 U/L (ref 38–126)
Anion gap: 6 (ref 5–15)
BILIRUBIN TOTAL: 0.8 mg/dL (ref 0.3–1.2)
BUN: 25 mg/dL — AB (ref 8–23)
CHLORIDE: 109 mmol/L (ref 98–111)
CO2: 25 mmol/L (ref 22–32)
Calcium: 9.3 mg/dL (ref 8.9–10.3)
Creatinine, Ser: 1.4 mg/dL — ABNORMAL HIGH (ref 0.61–1.24)
GFR calc Af Amer: 58 mL/min — ABNORMAL LOW (ref >60)
GFR calc non Af Amer: 50 mL/min — ABNORMAL LOW (ref >60)
GLUCOSE: 88 mg/dL (ref 70–99)
POTASSIUM: 4.5 mmol/L (ref 3.5–5.1)
SODIUM: 140 mmol/L (ref 135–145)
Total Protein: 6.8 g/dL (ref 6.5–8.1)

## 2018-01-09 LAB — CBC WITH DIFFERENTIAL/PLATELET
Basophils Absolute: 0.1 10*3/uL (ref 0–0.1)
Basophils Relative: 2 %
EOS PCT: 4 %
Eosinophils Absolute: 0.2 10*3/uL (ref 0–0.7)
HEMATOCRIT: 40 % (ref 40.0–52.0)
Hemoglobin: 14.1 g/dL (ref 13.0–18.0)
LYMPHS ABS: 0.8 10*3/uL — AB (ref 1.0–3.6)
LYMPHS PCT: 16 %
MCH: 32.7 pg (ref 26.0–34.0)
MCHC: 35.2 g/dL (ref 32.0–36.0)
MCV: 92.9 fL (ref 80.0–100.0)
MONO ABS: 0.5 10*3/uL (ref 0.2–1.0)
MONOS PCT: 9 %
Neutro Abs: 3.5 10*3/uL (ref 1.4–6.5)
Neutrophils Relative %: 69 %
Platelets: 189 10*3/uL (ref 150–440)
RBC: 4.31 MIL/uL — ABNORMAL LOW (ref 4.40–5.90)
RDW: 13.3 % (ref 11.5–14.5)
WBC: 5.1 10*3/uL (ref 3.8–10.6)

## 2018-01-09 MED ORDER — HEPARIN SOD (PORK) LOCK FLUSH 100 UNIT/ML IV SOLN
500.0000 [IU] | Freq: Once | INTRAVENOUS | Status: AC
  Administered 2018-01-09: 500 [IU] via INTRAVENOUS

## 2018-01-09 MED ORDER — SODIUM CHLORIDE 0.9% FLUSH
10.0000 mL | INTRAVENOUS | Status: DC | PRN
  Administered 2018-01-09: 10 mL via INTRAVENOUS
  Filled 2018-01-09: qty 10

## 2018-01-09 NOTE — Progress Notes (Signed)
[facility] OFFICE PROGRESS NOTE  Patient Care Team: Jerrol Banana., MD as PCP - General (Family Medicine) Carmon Ginsberg, [place] as Referring Physician (Family Medicine) Cammie Sickle, MD as Consulting Physician (Medical Oncology) Noreene Filbert, MD as Referring Physician (Radiation Oncology) Anell Barr, OD as Consulting Physician (Optometry)  Cancer Staging No matching staging information was found for the patient.   Oncology History   # AUG 6th 2018- DLBCL STAGE I [LEFT axilla- Core Bx- Dr.Sankar; CD-20, bcl-2;bcl-6 positive]; Ki- 67 >90%. MUM-1 NEG. GCB subtype; Myc- POS; FISH studies-NEGATIVE for gene RE-ARRANGEMENTS [positive for extra-copies of bcl-6/ myc/IgH/bcl-2]PET scan- Right Ax LN [4.5cm; SUV 35]  # R-CHOP [aug 20th 2018] x3 cycles; RT [starting 10/25; Nov 266 2018]; DEc 2018- CR ; except 8 mm left ax LN- no uptake.   # AUG 13th 2018. MUGA scan-56%.   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  male pleasant patient above history of diffuse large B-cell lymphoma is here for follow-up.  Patient denies any worsening lumps or bumps.  Appetite is good.  Complains of mild fatigue.  Denies any nausea vomiting abdominal pain.  Review of Systems  Constitutional: Positive for malaise/fatigue. Negative for chills, diaphoresis, fever and weight loss.  HENT: Negative for nosebleeds and sore throat.   Eyes: Negative for double vision.  Respiratory: Negative for cough, hemoptysis, sputum production, shortness of breath and wheezing.   Cardiovascular: Negative for chest pain, palpitations, orthopnea and leg swelling.  Gastrointestinal: Negative for abdominal pain, blood in stool, constipation, diarrhea, heartburn, melena, nausea and vomiting.  Genitourinary: Negative for dysuria,  frequency and urgency.  Musculoskeletal: Negative for back pain and joint pain.  Skin: Negative.  Negative for itching and rash.  Neurological: Positive for tingling (Mild tingling and numbness of the fingertips). Negative for dizziness, focal weakness, weakness and headaches.  Endo/Heme/Allergies: Does not bruise/bleed easily.  Psychiatric/Behavioral: Negative for depression. The patient is not nervous/anxious and does not have insomnia.       PAST MEDICAL HISTORY :  Past Medical History:  Diagnosis Date  . Allergy   . Arthritis   . Cancer ([place])    left axillary  . Cataract   . GERD (gastroesophageal reflux disease)   . Hyperlipidemia   . Hypertension     PAST SURGICAL HISTORY :   Past Surgical History:  Procedure Laterality Date  . CATARACT EXTRACTION Bilateral   . MOLE REMOVAL    . NECK MASS EXCISION    . PORTACATH PLACEMENT N/A 12/29/2016   Procedure: INSERTION PORT-A-CATH;  Surgeon: Christene Lye, MD;  Location: [facility] ORS;  Service: General;  Laterality: N/A;  . TONSILLECTOMY      FAMILY HISTORY :   Family History  Problem Relation Age of Onset  . Heart disease Mother   . Rheumatic fever Mother   . CAD Mother   . Heart attack Father   . COPD Sister     SOCIAL HISTORY:   Social History   Tobacco Use  . Smoking status: Never Smoker  . Smokeless tobacco: Never Used  Substance Use Topics  . Alcohol use: Yes    Alcohol/week: 5.0 standard drinks    Types: 5 Cans of beer per week  . Drug use: No  ALLERGIES:  is allergic to lovastatin; pravastatin; pravastatin sodium; simvastatin; statins; and sulfa antibiotics.  MEDICATIONS:  Current Outpatient Medications  Medication Sig Dispense Refill  . amLODipine (NORVASC) 5 MG tablet TAKE ONE TABLET BY MOUTH DAILY 90 tablet 3  . aspirin EC 81 MG tablet Take 81 mg by mouth daily.    . cetirizine (ZYRTEC) 10 MG tablet Take 10 mg by mouth daily.    . cholecalciferol (VITAMIN D) 1000 UNITS tablet Take 1,000  Units by mouth daily.     Marland Kitchen docusate sodium (COLACE) 100 MG capsule Take 200 mg by mouth daily.     Marland Kitchen ibuprofen (ADVIL,MOTRIN) 200 MG tablet Take 800 mg by mouth every 8 (eight) hours as needed (for pain).    Marland Kitchen lidocaine-prilocaine (EMLA) cream Apply 1 application topically as needed. To port a cath 30 g 3  . lisinopril (PRINIVIL,ZESTRIL) 40 MG tablet TAKE 1 TABLET (40 MG TOTAL) BY MOUTH DAILY. 90 tablet 3  . omeprazole (PRILOSEC) 20 MG capsule TAKE 1 CAPSULE (20 MG TOTAL) BY MOUTH DAILY. 90 capsule 2  . phenylephrine-shark liver oil-mineral oil-petrolatum (PREPARATION H) 0.25-14-74.9 % rectal ointment Place 1 application rectally 2 (two) times daily as needed for hemorrhoids.     No current facility-administered medications for this visit.     PHYSICAL EXAMINATION: ECOG PERFORMANCE STATUS: 0 - Asymptomatic  BP 115/77 (BP Location: Left Arm, Patient Position: Sitting)   Pulse 64   Temp (!) 97.1 F (36.2 C) (Tympanic)   Resp 16   There were no vitals filed for this visit.  Physical Exam  Constitutional: He is oriented to person, place, and time and well-developed, well-nourished, and in no distress.  HENT:  Head: Normocephalic and atraumatic.  Mouth/Throat: Oropharynx is clear and moist. No oropharyngeal exudate.  Eyes: Pupils are equal, round, and reactive to light.  Neck: Normal range of motion. Neck supple.  Cardiovascular: Normal rate and regular rhythm.  Pulmonary/Chest: No respiratory distress. He has no wheezes.  Abdominal: Soft. Bowel sounds are normal. He exhibits no distension and no mass. There is no tenderness. There is no rebound and no guarding.  Musculoskeletal: Normal range of motion. He exhibits no edema or tenderness.  Lymphadenopathy:  Appx 1 cm LN Sized rubbery lymph node- STABLE.   Neurological: He is alert and oriented to person, place, and time.  Skin: Skin is warm.  Psychiatric: Affect normal.       LABORATORY DATA:  I have reviewed the data as  listed    Component Value Date/Time   NA 140 01/09/2018 1014   NA 142 08/23/2017 1128   K 4.5 01/09/2018 1014   CL 109 01/09/2018 1014   CO2 25 01/09/2018 1014   GLUCOSE 88 01/09/2018 1014   BUN 25 (H) 01/09/2018 1014   BUN 17 08/23/2017 1128   CREATININE 1.40 (H) 01/09/2018 1014   CALCIUM 9.3 01/09/2018 1014   PROT 6.8 01/09/2018 1014   PROT 6.6 08/23/2017 1128   ALBUMIN 3.9 01/09/2018 1014   ALBUMIN 4.4 08/23/2017 1128   AST 18 01/09/2018 1014   ALT 14 01/09/2018 1014   ALKPHOS 61 01/09/2018 1014   BILITOT 0.8 01/09/2018 1014   BILITOT 0.6 08/23/2017 1128   GFRNONAA 50 (L) 01/09/2018 1014   GFRAA 58 (L) 01/09/2018 1014    No results found for: SPEP, UPEP  Lab Results  Component Value Date   WBC 5.1 01/09/2018   NEUTROABS 3.5 01/09/2018   HGB 14.1 01/09/2018   HCT 40.0 01/09/2018  MCV 92.9 01/09/2018   PLT 189 01/09/2018      Chemistry      Component Value Date/Time   NA 140 01/09/2018 1014   NA 142 08/23/2017 1128   K 4.5 01/09/2018 1014   CL 109 01/09/2018 1014   CO2 25 01/09/2018 1014   BUN 25 (H) 01/09/2018 1014   BUN 17 08/23/2017 1128   CREATININE 1.40 (H) 01/09/2018 1014   GLU 91 11/06/2013      Component Value Date/Time   CALCIUM 9.3 01/09/2018 1014   ALKPHOS 61 01/09/2018 1014   AST 18 01/09/2018 1014   ALT 14 01/09/2018 1014   BILITOT 0.8 01/09/2018 1014   BILITOT 0.6 08/23/2017 1128       RADIOGRAPHIC STUDIES: I have personally reviewed the radiological images as listed and agreed with the findings in the report. No results found.   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 Patient currently s/p  R-CHOP s/p cycle #3; s/p IFRT 10/25-start. DEC 2018- PET- CR- except for 1cm LN [on exam-left underarm]; STABLE.   #  1 cm lymph node in the left underarm- STABLE.   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  # CKD stage I  creatinine 1.2-1.4-; STABLE.  # port flush in 8 weeks; will keep the port for now; monitor for now; will take it out if stable at next visit.   # port flush in 2 months; 4 month labs/MD.    Orders Placed This Encounter  Procedures  . CBC with Differential/Platelet    Standing Status:   Future    Standing Expiration Date:   01/10/2019  . Comprehensive metabolic panel    Standing Status:   Future    Standing Expiration Date:   01/10/2019  . Lactate dehydrogenase    Standing Status:   Future    Standing Expiration Date:   01/10/2019   All questions were answered. The patient knows to call the clinic with any problems, questions or concerns.      Cammie Sickle, MD 01/09/2018 12:00 PM

## 2018-01-09 NOTE — Assessment & Plan Note (Addendum)
Diffuse large B-cell lymphoma- stage I. Patient currently s/p  R-CHOP s/p cycle #3; s/p IFRT 10/25-start. DEC 2018- PET- CR- except for 1cm LN [on exam-left underarm]; STABLE.   #  1 cm lymph node in the left underarm- STABLE.   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  # CKD stage I creatinine 1.2-1.4-; STABLE.  # port flush in 8 weeks; will keep the port for now; monitor for now; will take it out if stable at next visit.   # port flush in 2 months; 4 month labs/MD.

## 2018-02-21 ENCOUNTER — Encounter: Payer: Self-pay | Admitting: Family Medicine

## 2018-02-21 ENCOUNTER — Ambulatory Visit: Payer: Medicare Other | Admitting: Family Medicine

## 2018-02-21 VITALS — BP 132/70 | HR 70 | Temp 98.7°F | Resp 16 | Wt 223.0 lb

## 2018-02-21 DIAGNOSIS — I10 Essential (primary) hypertension: Secondary | ICD-10-CM

## 2018-02-21 DIAGNOSIS — Z23 Encounter for immunization: Secondary | ICD-10-CM

## 2018-02-21 DIAGNOSIS — E7800 Pure hypercholesterolemia, unspecified: Secondary | ICD-10-CM | POA: Diagnosis not present

## 2018-02-21 DIAGNOSIS — C8334 Diffuse large B-cell lymphoma, lymph nodes of axilla and upper limb: Secondary | ICD-10-CM

## 2018-02-21 NOTE — Progress Notes (Signed)
Patient: Henry [name] Male    DOB: 12-24-1945   72 y.o.   MRN: [id number] Visit Date: 02/21/2018  Today's Provider: Wilhemena Durie, MD   Chief Complaint  Patient presents with  . Hypertension   Subjective:    HPI  Hypertension, follow-up:  BP Readings from Last 3 Encounters:  02/21/18 132/70  01/09/18 115/77  11/04/17 128/80    He was last seen for hypertension 6 months ago.  BP at that visit was 144/92. Management since that visit includes no changes. He reports good compliance with treatment. He is not having side effects.  He is not exercising. He is adherent to low salt diet.   Outside blood pressures are not being checked. He is experiencing none.  Patient denies exertional chest pressure/discomfort, lower extremity edema and palpitations.   Cardiovascular risk factors include obesity (BMI >= 30 kg/m2).   Weight trend: stable Wt Readings from Last 3 Encounters:  02/21/18 223 lb (101.2 kg)  11/04/17 218 lb 7.6 oz (99.1 kg)  09/07/17 220 lb (99.8 kg)    Current diet: well balanced    Allergies  Allergen Reactions  . Lovastatin Other (See Comments)  . Pravastatin Nausea And Vomiting    joint ache  . Pravastatin Sodium     joint ache  . Simvastatin Other (See Comments)    muscle ache muscle ache  . Statins   . Sulfa Antibiotics Rash and Itching     Current Outpatient Medications:  .  amLODipine (NORVASC) 5 MG tablet, TAKE ONE TABLET BY MOUTH DAILY, Disp: 90 tablet, Rfl: 3 .  aspirin EC 81 MG tablet, Take 81 mg by mouth daily., Disp: , Rfl:  .  cetirizine (ZYRTEC) 10 MG tablet, Take 10 mg by mouth daily., Disp: , Rfl:  .  cholecalciferol (VITAMIN D) 1000 UNITS tablet, Take 1,000 Units by mouth daily. , Disp: , Rfl:  .  docusate sodium (COLACE) 100 MG capsule, Take 200 mg by mouth daily. , Disp: , Rfl:  .  ibuprofen (ADVIL,MOTRIN) 200 MG tablet, Take 800 mg by mouth every 8 (eight) hours as needed (for pain)., Disp: , Rfl:  .   lidocaine-prilocaine (EMLA) cream, Apply 1 application topically as needed. To port a cath, Disp: 30 g, Rfl: 3 .  lisinopril (PRINIVIL,ZESTRIL) 40 MG tablet, TAKE 1 TABLET (40 MG TOTAL) BY MOUTH DAILY., Disp: 90 tablet, Rfl: 3 .  omeprazole (PRILOSEC) 20 MG capsule, TAKE 1 CAPSULE (20 MG TOTAL) BY MOUTH DAILY., Disp: 90 capsule, Rfl: 2 .  phenylephrine-shark liver oil-mineral oil-petrolatum (PREPARATION H) 0.25-14-74.9 % rectal ointment, Place 1 application rectally 2 (two) times daily as needed for hemorrhoids., Disp: , Rfl:   Review of Systems  Constitutional: Negative.   Respiratory: Negative.  Negative for cough and shortness of breath.   Cardiovascular: Negative.  Negative for chest pain, palpitations and leg swelling.  Musculoskeletal: Negative.   Allergic/Immunologic: Negative.   Neurological: Negative for dizziness, tremors, syncope, weakness, light-headedness and headaches.  Psychiatric/Behavioral: Negative.     Social History   Tobacco Use  . Smoking status: Never Smoker  . Smokeless tobacco: Never Used  Substance Use Topics  . Alcohol use: Yes    Alcohol/week: 5.0 standard drinks    Types: 5 Cans of beer per week   Objective:   BP 132/70 (BP Location: Left Arm, Patient Position: Sitting, Cuff Size: Large)   Pulse 70   Temp 98.7 F (37.1 C)   Resp 16  Wt 223 lb (101.2 kg)   SpO2 98%   BMI 29.42 kg/m  Vitals:   02/21/18 1028  BP: 132/70  Pulse: 70  Resp: 16  Temp: 98.7 F (37.1 C)  SpO2: 98%  Weight: 223 lb (101.2 kg)     Physical Exam  Constitutional: He is oriented to person, place, and time. He appears well-developed and well-nourished.  HENT:  Head: Normocephalic and atraumatic.  Right Ear: External ear normal.  Left Ear: External ear normal.  Nose: Nose normal.  Mouth/Throat: Oropharynx is clear and moist.  Eyes: Conjunctivae are normal. No scleral icterus.  Neck: No thyromegaly present.  Cardiovascular: Normal rate, regular rhythm and normal  heart sounds.  Pulmonary/Chest: Effort normal and breath sounds normal.  Abdominal: Soft.  Musculoskeletal: He exhibits no edema.  Neurological: He is alert and oriented to person, place, and time.  Skin: Skin is warm and dry.  Psychiatric: He has a normal mood and affect. His behavior is normal. Judgment and thought content normal.        Assessment & Plan:     1. Essential (primary) hypertension Stable. Continue current medications. F/U in 6 months for CPE.  2. Hypercholesteremia Stable.   3. Need for influenza vaccination - Flu vaccine HIGH DOSE PF (Fluzone High dose) 4.Diffuse Large Cell Lymphoma      I have done the exam and reviewed the above chart and it is accurate to the best of my knowledge. Development worker, community has been used in this note in any air is in the dictation or transcription are unintentional.  Wilhemena Durie, MD  [place]

## 2018-03-04 ENCOUNTER — Other Ambulatory Visit: Payer: Self-pay | Admitting: Family Medicine

## 2018-03-04 DIAGNOSIS — I10 Essential (primary) hypertension: Secondary | ICD-10-CM

## 2018-03-13 ENCOUNTER — Inpatient Hospital Stay: Payer: Medicare Other | Attending: Internal Medicine

## 2018-03-13 DIAGNOSIS — C8334 Diffuse large B-cell lymphoma, lymph nodes of axilla and upper limb: Secondary | ICD-10-CM | POA: Diagnosis present

## 2018-03-13 DIAGNOSIS — Z452 Encounter for adjustment and management of vascular access device: Secondary | ICD-10-CM | POA: Diagnosis present

## 2018-03-13 DIAGNOSIS — Z95828 Presence of other vascular implants and grafts: Secondary | ICD-10-CM

## 2018-03-13 MED ORDER — HEPARIN SOD (PORK) LOCK FLUSH 100 UNIT/ML IV SOLN
500.0000 [IU] | Freq: Once | INTRAVENOUS | Status: AC
  Administered 2018-03-13: 500 [IU] via INTRAVENOUS

## 2018-03-13 MED ORDER — SODIUM CHLORIDE 0.9% FLUSH
10.0000 mL | INTRAVENOUS | Status: DC | PRN
  Administered 2018-03-13: 10 mL via INTRAVENOUS
  Filled 2018-03-13: qty 10

## 2018-05-01 ENCOUNTER — Inpatient Hospital Stay: Payer: Medicare Other | Attending: Internal Medicine

## 2018-05-01 ENCOUNTER — Inpatient Hospital Stay (HOSPITAL_BASED_OUTPATIENT_CLINIC_OR_DEPARTMENT_OTHER): Payer: Medicare Other | Admitting: Internal Medicine

## 2018-05-01 ENCOUNTER — Other Ambulatory Visit: Payer: Self-pay

## 2018-05-01 ENCOUNTER — Ambulatory Visit
Admission: RE | Admit: 2018-05-01 | Discharge: 2018-05-01 | Disposition: A | Discharge status: Home / Self Care | Payer: Medicare Other | Source: Ambulatory Visit | Attending: Internal Medicine | Admitting: Internal Medicine

## 2018-05-01 VITALS — BP 144/83 | HR 75 | Temp 98.2°F | Resp 20 | Ht 73.0 in | Wt 222.2 lb

## 2018-05-01 DIAGNOSIS — N181 Chronic kidney disease, stage 1: Secondary | ICD-10-CM | POA: Insufficient documentation

## 2018-05-01 DIAGNOSIS — Z452 Encounter for adjustment and management of vascular access device: Secondary | ICD-10-CM

## 2018-05-01 DIAGNOSIS — C8334 Diffuse large B-cell lymphoma, lymph nodes of axilla and upper limb: Secondary | ICD-10-CM

## 2018-05-01 DIAGNOSIS — G629 Polyneuropathy, unspecified: Secondary | ICD-10-CM

## 2018-05-01 DIAGNOSIS — Z95828 Presence of other vascular implants and grafts: Secondary | ICD-10-CM

## 2018-05-01 LAB — COMPREHENSIVE METABOLIC PANEL
ALK PHOS: 62 U/L (ref 38–126)
ALT: 15 U/L (ref 0–44)
AST: 20 U/L (ref 15–41)
Albumin: 3.9 g/dL (ref 3.5–5.0)
Anion gap: 6 (ref 5–15)
BILIRUBIN TOTAL: 0.9 mg/dL (ref 0.3–1.2)
BUN: 19 mg/dL (ref 8–23)
CALCIUM: 9 mg/dL (ref 8.9–10.3)
CO2: 24 mmol/L (ref 22–32)
Chloride: 109 mmol/L (ref 98–111)
Creatinine, Ser: 1.38 mg/dL — ABNORMAL HIGH (ref 0.61–1.24)
GFR calc non Af Amer: 51 mL/min — ABNORMAL LOW (ref >60)
GFR, EST AFRICAN AMERICAN: 60 mL/min — AB (ref >60)
Glucose, Bld: 111 mg/dL — ABNORMAL HIGH (ref 70–99)
Potassium: 3.9 mmol/L (ref 3.5–5.1)
Sodium: 139 mmol/L (ref 135–145)
TOTAL PROTEIN: 6.6 g/dL (ref 6.5–8.1)

## 2018-05-01 LAB — CBC WITH DIFFERENTIAL/PLATELET
Abs Immature Granulocytes: 0.01 10*3/uL (ref 0.00–0.07)
BASOS PCT: 1 %
Basophils Absolute: 0.1 10*3/uL (ref 0.0–0.1)
EOS ABS: 0.2 10*3/uL (ref 0.0–0.5)
Eosinophils Relative: 4 %
HCT: 40.4 % (ref 39.0–52.0)
Hemoglobin: 13.6 g/dL (ref 13.0–17.0)
Immature Granulocytes: 0 %
Lymphocytes Relative: 18 %
Lymphs Abs: 0.9 10*3/uL (ref 0.7–4.0)
MCH: 30.7 pg (ref 26.0–34.0)
MCHC: 33.7 g/dL (ref 30.0–36.0)
MCV: 91.2 fL (ref 80.0–100.0)
MONOS PCT: 8 %
Monocytes Absolute: 0.4 10*3/uL (ref 0.1–1.0)
NRBC: 0 % (ref 0.0–0.2)
Neutro Abs: 3.6 10*3/uL (ref 1.7–7.7)
Neutrophils Relative %: 69 %
PLATELETS: 187 10*3/uL (ref 150–400)
RBC: 4.43 MIL/uL (ref 4.22–5.81)
RDW: 12.7 % (ref 11.5–15.5)
WBC: 5.2 10*3/uL (ref 4.0–10.5)

## 2018-05-01 LAB — LACTATE DEHYDROGENASE: LDH: 105 U/L (ref 98–192)

## 2018-05-01 MED ORDER — HEPARIN SOD (PORK) LOCK FLUSH 100 UNIT/ML IV SOLN
500.0000 [IU] | Freq: Once | INTRAVENOUS | Status: AC
  Administered 2018-05-01: 500 [IU] via INTRAVENOUS
  Filled 2018-05-01: qty 5

## 2018-05-01 MED ORDER — SODIUM CHLORIDE 0.9% FLUSH
10.0000 mL | Freq: Once | INTRAVENOUS | Status: AC
  Administered 2018-05-01: 10 mL via INTRAVENOUS
  Filled 2018-05-01: qty 10

## 2018-05-01 NOTE — Assessment & Plan Note (Addendum)
Diffuse large B-cell lymphoma- stage I. Patient currently s/p  R-CHOP s/p cycle #3; s/p IFRT; PET scan December 2018 CR stable except for 1 cm lymph node in the left underarm.  Stable.  #  1 cm lymph node in the left underarm-STABLE; not resolved; recommend [place] left axilla for further evaluation.    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  # DISPOSITION:  # [place] axilla asap; will call with results/decide on port # follow up in 2 months/labs-cbc/cmp/port flush-Dr.B

## 2018-05-01 NOTE — Progress Notes (Signed)
[facility] OFFICE PROGRESS NOTE  Patient Care Team: Jerrol Banana., MD as PCP - General (Family Medicine) Carmon Ginsberg, [place] as Referring Physician (Family Medicine) Cammie Sickle, MD as Consulting Physician (Medical Oncology) Noreene Filbert, MD as Referring Physician (Radiation Oncology) Anell Barr, OD as Consulting Physician (Optometry)  Cancer Staging No matching staging information was found for the patient.   Oncology History   # AUG 6th 2018- DLBCL STAGE I [LEFT axilla- Core Bx- Dr.Sankar; CD-20, bcl-2;bcl-6 positive]; Ki- 67 >90%. MUM-1 NEG. GCB subtype; Myc- POS; FISH studies-NEGATIVE for gene RE-ARRANGEMENTS [positive for extra-copies of bcl-6/ myc/IgH/bcl-2]PET scan- Right Ax LN [4.5cm; SUV 35]  # R-CHOP [aug 20th 2018] x3 cycles; RT [starting 10/25; Nov 266 2018]; DEc 2018- CR ; except 8 mm left ax LN- no uptake.   # AUG 13th 2018. MUGA scan-56%.   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  male pleasant patient above history of diffuse large B-cell lymphoma is here for follow-up.  Patient interested in having his port taken out.  He denies any new lumps or bumps.  Denies any unusual weight loss or night sweats.  Appetite is good.   Review of Systems  Constitutional: Positive for malaise/fatigue. Negative for chills, diaphoresis, fever and weight loss.  HENT: Negative for nosebleeds and sore throat.   Eyes: Negative for double vision.  Respiratory: Negative for cough, hemoptysis, sputum production, shortness of breath and wheezing.   Cardiovascular: Negative for chest pain, palpitations, orthopnea and leg swelling.  Gastrointestinal: Negative for abdominal pain, blood in stool, constipation, diarrhea, heartburn, melena, nausea and vomiting.  Genitourinary:  Negative for dysuria, frequency and urgency.  Musculoskeletal: Negative for back pain and joint pain.  Skin: Negative.  Negative for itching and rash.  Neurological: Positive for tingling (Mild tingling and numbness of the fingertips). Negative for dizziness, focal weakness, weakness and headaches.  Endo/Heme/Allergies: Does not bruise/bleed easily.  Psychiatric/Behavioral: Negative for depression. The patient is not nervous/anxious and does not have insomnia.       PAST MEDICAL HISTORY :  Past Medical History:  Diagnosis Date  . Allergy   . Arthritis   . Cancer ([place])    left axillary  . Cataract   . GERD (gastroesophageal reflux disease)   . Hyperlipidemia   . Hypertension     PAST SURGICAL HISTORY :   Past Surgical History:  Procedure Laterality Date  . CATARACT EXTRACTION Bilateral   . MOLE REMOVAL    . NECK MASS EXCISION    . PORTACATH PLACEMENT N/A 12/29/2016   Procedure: INSERTION PORT-A-CATH;  Surgeon: Christene Lye, MD;  Location: [facility] ORS;  Service: General;  Laterality: N/A;  . TONSILLECTOMY      FAMILY HISTORY :   Family History  Problem Relation Age of Onset  . Heart disease Mother   . Rheumatic fever Mother   . CAD Mother   . Heart attack Father   . COPD Sister     SOCIAL HISTORY:   Social History   Tobacco Use  . Smoking status: Never Smoker  . Smokeless tobacco: Never Used  Substance Use Topics  . Alcohol use: Yes    Alcohol/week: 5.0 standard drinks    Types: 5 Cans of beer per week  .  Drug use: No    ALLERGIES:  is allergic to lovastatin; pravastatin; pravastatin sodium; simvastatin; statins; and sulfa antibiotics.  MEDICATIONS:  Current Outpatient Medications  Medication Sig Dispense Refill  . amLODipine (NORVASC) 5 MG tablet TAKE ONE TABLET BY MOUTH DAILY 90 tablet 2  . aspirin EC 81 MG tablet Take 81 mg by mouth daily.    . cetirizine (ZYRTEC) 10 MG tablet Take 10 mg by mouth daily.    . cholecalciferol (VITAMIN D) 1000 UNITS  tablet Take 1,000 Units by mouth daily.     Marland Kitchen lidocaine-prilocaine (EMLA) cream Apply 1 application topically as needed. To port a cath 30 g 3  . lisinopril (PRINIVIL,ZESTRIL) 40 MG tablet TAKE 1 TABLET (40 MG TOTAL) BY MOUTH DAILY. 78 tablet 2  . omeprazole (PRILOSEC) 20 MG capsule TAKE 1 CAPSULE (20 MG TOTAL) BY MOUTH DAILY. 73 capsule 1  . docusate sodium (COLACE) 100 MG capsule Take 200 mg by mouth daily.     Marland Kitchen ibuprofen (ADVIL,MOTRIN) 200 MG tablet Take 800 mg by mouth every 8 (eight) hours as needed (for pain).    . phenylephrine-shark liver oil-mineral oil-petrolatum (PREPARATION H) 0.25-14-74.9 % rectal ointment Place 1 application rectally 2 (two) times daily as needed for hemorrhoids.     No current facility-administered medications for this visit.     PHYSICAL EXAMINATION: ECOG PERFORMANCE STATUS: 0 - Asymptomatic  BP (!) 144/83 (BP Location: Right Arm, Patient Position: Sitting)   Pulse 75   Temp 98.2 F (36.8 C) (Oral)   Resp 20   Ht 6' 1"  (1.854 m)   Wt 222 lb 3.2 oz (100.8 kg)   BMI 29.32 kg/m   Filed Weights   05/01/18 1052  Weight: 222 lb 3.2 oz (100.8 kg)    Physical Exam  Constitutional: He is oriented to person, place, and time and well-developed, well-nourished, and in no distress.  HENT:  Head: Normocephalic and atraumatic.  Mouth/Throat: Oropharynx is clear and moist. No oropharyngeal exudate.  Eyes: Pupils are equal, round, and reactive to light.  Neck: Normal range of motion. Neck supple.  Cardiovascular: Normal rate and regular rhythm.  Pulmonary/Chest: No respiratory distress. He has no wheezes.  Abdominal: Soft. Bowel sounds are normal. He exhibits no distension and no mass. There is no abdominal tenderness. There is no rebound and no guarding.  Musculoskeletal: Normal range of motion.        General: No tenderness or edema.  Lymphadenopathy:  Appx 1 cm LN Sized rubbery lymph node- STABLE.   Neurological: He is alert and oriented to person,  place, and time.  Skin: Skin is warm.  Psychiatric: Affect normal.       LABORATORY DATA:  I have reviewed the data as listed    Component Value Date/Time   NA 139 05/01/2018 1020   NA 142 08/23/2017 1128   K 3.9 05/01/2018 1020   CL 109 05/01/2018 1020   CO2 24 05/01/2018 1020   GLUCOSE 111 (H) 05/01/2018 1020   BUN 19 05/01/2018 1020   BUN 17 08/23/2017 1128   CREATININE 1.38 (H) 05/01/2018 1020   CALCIUM 9.0 05/01/2018 1020   PROT 6.6 05/01/2018 1020   PROT 6.6 08/23/2017 1128   ALBUMIN 3.9 05/01/2018 1020   ALBUMIN 4.4 08/23/2017 1128   AST 20 05/01/2018 1020   ALT 15 05/01/2018 1020   ALKPHOS 62 05/01/2018 1020   BILITOT 0.9 05/01/2018 1020   BILITOT 0.6 08/23/2017 1128   GFRNONAA 51 (L) 05/01/2018 1020  GFRAA 60 (L) 05/01/2018 1020    No results found for: SPEP, UPEP  Lab Results  Component Value Date   WBC 5.2 05/01/2018   NEUTROABS 3.6 05/01/2018   HGB 13.6 05/01/2018   HCT 40.4 05/01/2018   MCV 91.2 05/01/2018   PLT 187 05/01/2018      Chemistry      Component Value Date/Time   NA 139 05/01/2018 1020   NA 142 08/23/2017 1128   K 3.9 05/01/2018 1020   CL 109 05/01/2018 1020   CO2 24 05/01/2018 1020   BUN 19 05/01/2018 1020   BUN 17 08/23/2017 1128   CREATININE 1.38 (H) 05/01/2018 1020   GLU 91 11/06/2013      Component Value Date/Time   CALCIUM 9.0 05/01/2018 1020   ALKPHOS 62 05/01/2018 1020   AST 20 05/01/2018 1020   ALT 15 05/01/2018 1020   BILITOT 0.9 05/01/2018 1020   BILITOT 0.6 08/23/2017 1128       RADIOGRAPHIC STUDIES: I have personally reviewed the radiological images as listed and agreed with the findings in the report. No results found.   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 Patient currently s/p  R-CHOP s/p cycle #3; s/p IFRT; PET scan December 2018 CR stable except for 1 cm lymph node in the left underarm.  Stable.  #  1 cm lymph  node in the left underarm-STABLE; not resolved; recommend [place] left axilla for further evaluation.    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  # DISPOSITION:  # [place] axilla asap; will call with results/decide on port # follow up in 2 months/labs-cbc/cmp/port flush-Dr.B   Orders Placed This Encounter  Procedures  . [place] AXILLA LEFT    Standing Status:   Future    Number of Occurrences:   1    Standing Expiration Date:   07/03/2019    Order Specific Question:   Reason for Exam (SYMPTOM  OR DIAGNOSIS REQUIRED)    Answer:   left axillary LN: hx of lymphoma s/p chemo    Order Specific Question:   Preferred imaging location?    Answer:   [facility] [place]  . CBC with Differential/Platelet    Standing Status:   Future    Standing Expiration Date:   05/02/2019  . Comprehensive metabolic panel    Standing Status:   Future    Standing Expiration Date:   05/02/2019  . Ambulatory referral to General Surgery    Referral Priority:   Routine    Referral Type:   Surgical    Referral Reason:   Specialty Services Required    Referred to Provider:   Robert Bellow, MD    Requested Specialty:   General Surgery    Number of Visits Requested:   1   All questions were answered. The patient knows to call the clinic with any problems, questions or concerns.      Cammie Sickle, MD 05/01/2018 1:08 PM

## 2018-05-04 ENCOUNTER — Encounter: Payer: Self-pay | Admitting: Internal Medicine

## 2018-05-05 ENCOUNTER — Telehealth: Payer: Self-pay | Admitting: *Deleted

## 2018-05-05 DIAGNOSIS — C8334 Diffuse large B-cell lymphoma, lymph nodes of axilla and upper limb: Secondary | ICD-10-CM

## 2018-05-05 NOTE — Telephone Encounter (Signed)
Patient requesting call back to determine ultrasound results. I explained to patient that the results are not yet read. I did call and speak to [facility] in radiology, who will follow-up on why the results are not yet available.  Patient highly anxious to have results. I apologized for not having the results readily available. Pt stated that the surgeon's office is calling him to have his "port a cath removed and they are unable to proceed unless they have this report."  I told the patient that I would follow-up with him on Monday at 8:30 am to determine if the results have been read or not. I would update him on this process.  After I hung up with the patient, Linus Orn return my phone call and stated that she is diligently working to have the report ready asap.

## 2018-05-06 IMAGING — PT NM PET TUM IMG INITIAL (PI) SKULL BASE T - THIGH
1 of 10 series · 1 of 25 positions shown · non-contrast
Comparison: None

CLINICAL DATA: Initial Treatment strategy for lymphoma .

EXAM:
NUCLEAR MEDICINE PET SKULL BASE TO THIGH
TECHNIQUE: 12.7 mCi F-18 FDG was injected intravenously. Full-ring PET imaging
was performed from the skull base to thigh after the radiotracer. CT
data was obtained and used for attenuation correction and anatomic
localization.
FASTING BLOOD GLUCOSE:  Value: 98 mg/dl

[Series 3: ct wb 5.0 b30f · axial · 5.0mm · 0.98mm/px · 1 of 329 slices shown]
[im 329/329  brain]
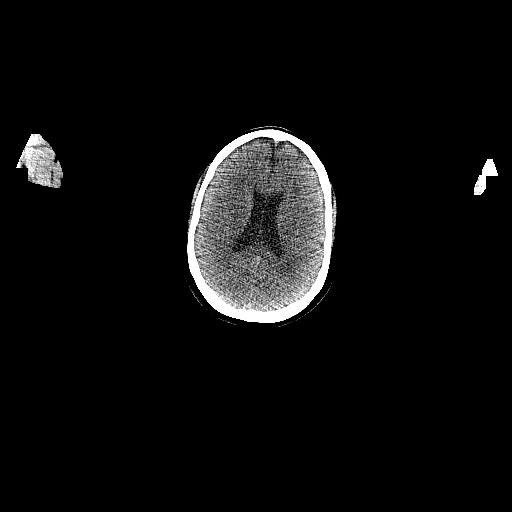

[1 of 25 positions shown; findings below may reference images not displayed]

FINDINGS: NECK: No hypermetabolic lymph nodes in the neck.

CHEST: Enlarged and hypermetabolic nodal mass in the left axilla
measures 4.4 x 2.5 cm and has an SUV max equal to 36.6. No
hypermetabolic supraclavicular or right axillary lymph nodes.

No hypermetabolic mediastinal or hilar lymph nodes.

No pleural effusion.  No airspace consolidation or atelectasis.

ABDOMEN/PELVIS: No abnormal hypermetabolic activity within the
liver, pancreas, adrenal glands, or spleen. No hypermetabolic lymph
nodes in the abdomen or pelvis.

SKELETON: No focal hypermetabolic activity to suggest skeletal
metastasis.
IMPRESSION: 1. Within the left axilla there is a intensely hypermetabolic nodal
mass within SUV max equal to 36.6. Imaging findings are compatible
with the clinical history of lymphoma.
2. No additional sites of hypermetabolic tumor identified.

## 2018-05-08 NOTE — Telephone Encounter (Signed)
Spoke to pt re: [place] of left axilla; ordered PET for further evalaution.  Please schedule PET ASAP; follow up with me 1-2 days later; no labs  Dr.B

## 2018-05-12 IMAGING — DX DG CHEST 1V
1 series · 1 of 1 positions shown · non-contrast
Comparison: None in PACs

CLINICAL DATA: Status post Port-A-Cath placement.

EXAM:
CHEST 1 VIEW

[chest ap]
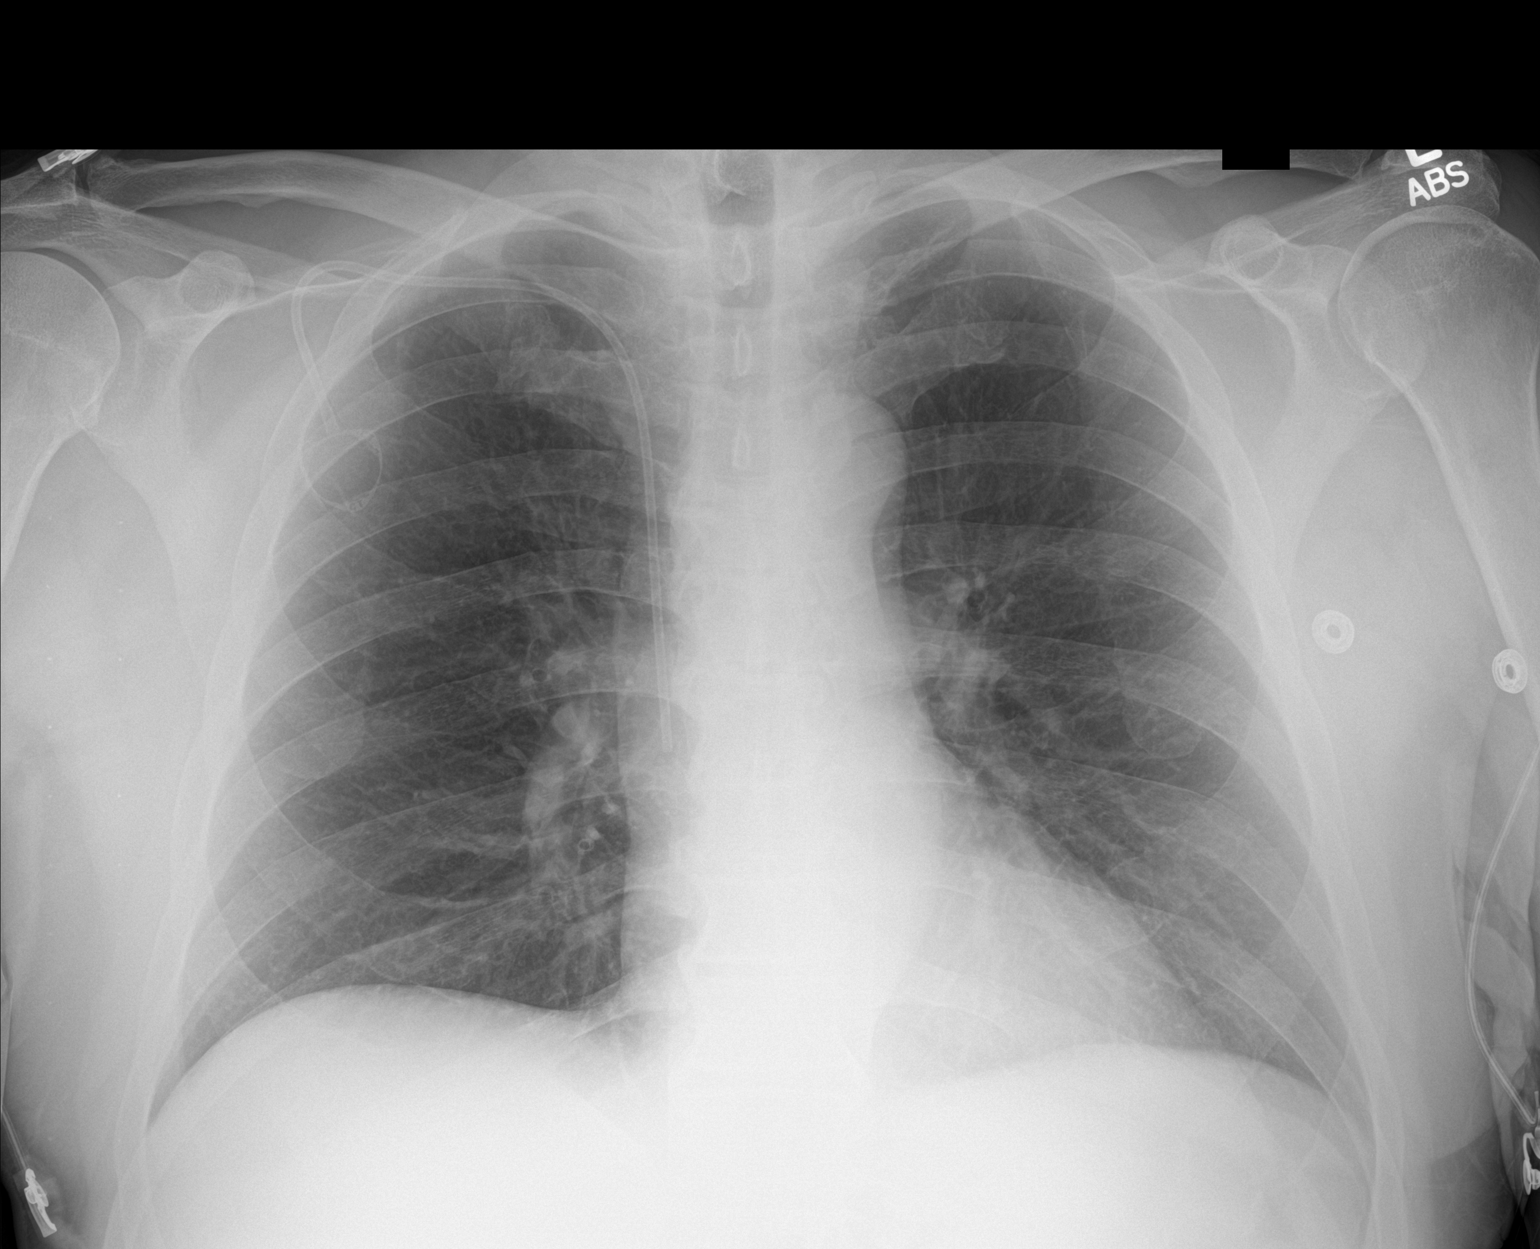

[1 of 1 positions shown; findings below may reference images not displayed]

FINDINGS: The lungs are well-expanded and clear. There is no postprocedure
pneumothorax or hemothorax. The heart and pulmonary vascularity are
normal. The porta catheter tip projects over the midportion of the
SVC. The bony thorax is unremarkable.
IMPRESSION: There is no postprocedure complication following Port-A-Cath
placement.

## 2018-05-15 ENCOUNTER — Encounter
Admission: RE | Admit: 2018-05-15 | Discharge: 2018-05-15 | Disposition: A | Discharge status: Home / Self Care | Payer: Medicare Other | Source: Ambulatory Visit | Attending: Internal Medicine | Admitting: Internal Medicine

## 2018-05-15 DIAGNOSIS — C8334 Diffuse large B-cell lymphoma, lymph nodes of axilla and upper limb: Secondary | ICD-10-CM | POA: Insufficient documentation

## 2018-05-15 LAB — GLUCOSE, CAPILLARY: Glucose-Capillary: 98 mg/dL (ref 70–99)

## 2018-05-15 MED ORDER — FLUDEOXYGLUCOSE F - 18 (FDG) INJECTION
11.9100 | Freq: Once | INTRAVENOUS | Status: AC | PRN
  Administered 2018-05-15: 11.91 via INTRAVENOUS

## 2018-05-16 ENCOUNTER — Inpatient Hospital Stay (HOSPITAL_BASED_OUTPATIENT_CLINIC_OR_DEPARTMENT_OTHER): Payer: Medicare Other | Admitting: Internal Medicine

## 2018-05-16 ENCOUNTER — Encounter: Payer: Self-pay | Admitting: Internal Medicine

## 2018-05-16 VITALS — BP 153/87 | HR 73 | Temp 97.8°F | Wt 224.8 lb

## 2018-05-16 DIAGNOSIS — G629 Polyneuropathy, unspecified: Secondary | ICD-10-CM | POA: Diagnosis not present

## 2018-05-16 DIAGNOSIS — C8334 Diffuse large B-cell lymphoma, lymph nodes of axilla and upper limb: Secondary | ICD-10-CM

## 2018-05-16 DIAGNOSIS — N181 Chronic kidney disease, stage 1: Secondary | ICD-10-CM

## 2018-05-16 NOTE — Assessment & Plan Note (Addendum)
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  December 2019-PET scan shows no significant uptake or any concerns for pathologic lymphadenopathy.   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   # CKD stage I creatinine 1.2-1.4-; stable  # I discussed personally with radiology regarding the results of ultrasound/PET scan with Dr.Veazey.  Given the absence of any concern for recurrent disease I think is reasonable to have the port explanted.  Patient will call his surgeon's office to have it explanted.  # DISPOSITION: # follow up in 4 months-MD/cbc/cmp/ldh- Dr.B [[place]].  # 25 minutes face-to-face with the patient discussing the above plan of care; more than 50% of time spent on prognosis/ natural history; counseling and coordination.

## 2018-05-16 NOTE — Patient Instructions (Signed)
#  Call Dr. Charissa Bash s office regarding port explantation.

## 2018-05-16 NOTE — Progress Notes (Signed)
[facility] [facility] OFFICE PROGRESS NOTE  Patient Care Team: Jerrol Banana., MD as PCP - General (Family Medicine) Carmon Ginsberg, [place] as Referring Physician (Family Medicine) Cammie Sickle, MD as Consulting Physician (Medical Oncology) Noreene Filbert, MD as Referring Physician (Radiation Oncology) Anell Barr, OD as Consulting Physician (Optometry)  Cancer Staging No matching staging information was found for the patient.   Oncology History   # AUG 6th 2018- DLBCL STAGE I [LEFT axilla- Core Bx- Dr.Sankar; CD-20, bcl-2;bcl-6 positive]; Ki- 67 >90%. MUM-1 NEG. GCB subtype; Myc- POS; FISH studies-NEGATIVE for gene RE-ARRANGEMENTS [positive for extra-copies of bcl-6/ myc/IgH/bcl-2]PET scan- Right Ax LN [4.5cm; SUV 35]  # R-CHOP [aug 20th 2018] x3 cycles; RT [starting 10/25; Nov 266 2018]; DEc 2018- CR ; except 8 mm left ax LN- no uptake.   # AUG 13th 2018. MUGA scan-56%.   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  male pleasant patient above history of diffuse large B-cell lymphoma is here for follow-up/to review the results of the PET scan.  Patient denies any unusual weight loss or night sweats.  Appetite is good.  Denies any worsening lymphadenopathy in his left underarm.   Fatigue is improving.  No swelling in the legs.  Mild numbness in the fingertips.  Review of Systems  Constitutional: Positive for malaise/fatigue. Negative for chills, diaphoresis, fever and weight loss.  HENT: Negative for nosebleeds and sore throat.   Eyes: Negative for double vision.  Respiratory: Negative for cough, hemoptysis, sputum production, shortness of breath and wheezing.   Cardiovascular: Negative for chest pain, palpitations, orthopnea and leg swelling.  Gastrointestinal: Negative for abdominal pain,  blood in stool, constipation, diarrhea, heartburn, melena, nausea and vomiting.  Genitourinary: Negative for dysuria, frequency and urgency.  Musculoskeletal: Negative for back pain and joint pain.  Skin: Negative.  Negative for itching and rash.  Neurological: Positive for tingling (Mild tingling and numbness of the fingertips). Negative for dizziness, focal weakness, weakness and headaches.  Endo/Heme/Allergies: Does not bruise/bleed easily.  Psychiatric/Behavioral: Negative for depression. The patient is not nervous/anxious and does not have insomnia.       PAST MEDICAL HISTORY :  Past Medical History:  Diagnosis Date  . Allergy   . Arthritis   . Cancer ([place])    left axillary  . Cataract   . GERD (gastroesophageal reflux disease)   . Hyperlipidemia   . Hypertension     PAST SURGICAL HISTORY :   Past Surgical History:  Procedure Laterality Date  . CATARACT EXTRACTION Bilateral   . MOLE REMOVAL    . NECK MASS EXCISION    . PORTACATH PLACEMENT N/A 12/29/2016   Procedure: INSERTION PORT-A-CATH;  Surgeon: Christene Lye, MD;  Location: [facility] ORS;  Service: General;  Laterality: N/A;  . TONSILLECTOMY      FAMILY HISTORY :   Family History  Problem Relation Age of Onset  . Heart disease Mother   . Rheumatic fever Mother   . CAD Mother   . Heart attack Father   . COPD Sister     SOCIAL HISTORY:   Social History   Tobacco Use  . Smoking status: Never Smoker  . Smokeless tobacco: Never Used  Substance Use Topics  . Alcohol use: Yes  Alcohol/week: 5.0 standard drinks    Types: 5 Cans of beer per week  . Drug use: No    ALLERGIES:  is allergic to lovastatin; pravastatin; pravastatin sodium; simvastatin; statins; and sulfa antibiotics.  MEDICATIONS:  Current Outpatient Medications  Medication Sig Dispense Refill  . amLODipine (NORVASC) 5 MG tablet TAKE ONE TABLET BY MOUTH DAILY 90 tablet 2  . aspirin EC 81 MG tablet Take 81 mg by mouth daily.    .  cetirizine (ZYRTEC) 10 MG tablet Take 10 mg by mouth daily.    . cholecalciferol (VITAMIN D) 1000 UNITS tablet Take 1,000 Units by mouth daily.     Marland Kitchen docusate sodium (COLACE) 100 MG capsule Take 200 mg by mouth daily.     Marland Kitchen ibuprofen (ADVIL,MOTRIN) 200 MG tablet Take 800 mg by mouth every 8 (eight) hours as needed (for pain).    Marland Kitchen lidocaine-prilocaine (EMLA) cream Apply 1 application topically as needed. To port a cath 30 g 3  . lisinopril (PRINIVIL,ZESTRIL) 40 MG tablet TAKE 1 TABLET (40 MG TOTAL) BY MOUTH DAILY. 78 tablet 2  . omeprazole (PRILOSEC) 20 MG capsule TAKE 1 CAPSULE (20 MG TOTAL) BY MOUTH DAILY. 73 capsule 1  . phenylephrine-shark liver oil-mineral oil-petrolatum (PREPARATION H) 0.25-14-74.9 % rectal ointment Place 1 application rectally 2 (two) times daily as needed for hemorrhoids.     No current facility-administered medications for this visit.     PHYSICAL EXAMINATION: ECOG PERFORMANCE STATUS: 0 - Asymptomatic  BP (!) 153/87 (BP Location: Left Arm, Patient Position: Sitting, Cuff Size: Normal)   Pulse 73   Temp 97.8 F (36.6 C) (Tympanic)   Wt 224 lb 12.1 oz (101.9 kg)   BMI 29.65 kg/m   Filed Weights   05/16/18 1314  Weight: 224 lb 12.1 oz (101.9 kg)    Physical Exam  Constitutional: He is oriented to person, place, and time and well-developed, well-nourished, and in no distress.  HENT:  Head: Normocephalic and atraumatic.  Mouth/Throat: Oropharynx is clear and moist. No oropharyngeal exudate.  Eyes: Pupils are equal, round, and reactive to light.  Neck: Normal range of motion. Neck supple.  Cardiovascular: Normal rate and regular rhythm.  Pulmonary/Chest: No respiratory distress. He has no wheezes.  Abdominal: Soft. Bowel sounds are normal. He exhibits no distension and no mass. There is no abdominal tenderness. There is no rebound and no guarding.  Musculoskeletal: Normal range of motion.        General: No tenderness or edema.  Lymphadenopathy:  Appx 1 cm  LN Sized rubbery lymph node- STABLE.   Neurological: He is alert and oriented to person, place, and time.  Skin: Skin is warm.  Psychiatric: Affect normal.       LABORATORY DATA:  I have reviewed the data as listed    Component Value Date/Time   NA 139 05/01/2018 1020   NA 142 08/23/2017 1128   K 3.9 05/01/2018 1020   CL 109 05/01/2018 1020   CO2 24 05/01/2018 1020   GLUCOSE 111 (H) 05/01/2018 1020   BUN 19 05/01/2018 1020   BUN 17 08/23/2017 1128   CREATININE 1.38 (H) 05/01/2018 1020   CALCIUM 9.0 05/01/2018 1020   PROT 6.6 05/01/2018 1020   PROT 6.6 08/23/2017 1128   ALBUMIN 3.9 05/01/2018 1020   ALBUMIN 4.4 08/23/2017 1128   AST 20 05/01/2018 1020   ALT 15 05/01/2018 1020   ALKPHOS 62 05/01/2018 1020   BILITOT 0.9 05/01/2018 1020   BILITOT 0.6 08/23/2017 1128  GFRNONAA 51 (L) 05/01/2018 1020   GFRAA 60 (L) 05/01/2018 1020    No results found for: SPEP, UPEP  Lab Results  Component Value Date   WBC 5.2 05/01/2018   NEUTROABS 3.6 05/01/2018   HGB 13.6 05/01/2018   HCT 40.4 05/01/2018   MCV 91.2 05/01/2018   PLT 187 05/01/2018      Chemistry      Component Value Date/Time   NA 139 05/01/2018 1020   NA 142 08/23/2017 1128   K 3.9 05/01/2018 1020   CL 109 05/01/2018 1020   CO2 24 05/01/2018 1020   BUN 19 05/01/2018 1020   BUN 17 08/23/2017 1128   CREATININE 1.38 (H) 05/01/2018 1020   GLU 91 11/06/2013      Component Value Date/Time   CALCIUM 9.0 05/01/2018 1020   ALKPHOS 62 05/01/2018 1020   AST 20 05/01/2018 1020   ALT 15 05/01/2018 1020   BILITOT 0.9 05/01/2018 1020   BILITOT 0.6 08/23/2017 1128       RADIOGRAPHIC STUDIES: I have personally reviewed the radiological images as listed and agreed with the findings in the report. No results found.   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 s/p  R-CHOP s/p cycle #3; s/p IFRT; December 2019-ultrasound showed 3  prominent left axial lymph nodes.  December 2019-PET scan shows no significant uptake or any concerns for pathologic lymphadenopathy.   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   # CKD stage I creatinine 1.2-1.4-; stable  # I discussed personally with radiology regarding the results of ultrasound/PET scan with Dr.Veazey.  Given the absence of any concern for recurrent disease I think is reasonable to have the port explanted.  Patient will call his surgeon's office to have it explanted.  # DISPOSITION: # follow up in 4 months-MD/cbc/cmp/ldh- Dr.B [[place]].  # 25 minutes face-to-face with the patient discussing the above plan of care; more than 50% of time spent on prognosis/ natural history; counseling and coordination.    No orders of the defined types were placed in this encounter.  All questions were answered. The patient knows to call the clinic with any problems, questions or concerns.      Cammie Sickle, MD 05/18/2018 7:36 PM

## 2018-05-24 ENCOUNTER — Ambulatory Visit
Admission: RE | Admit: 2018-05-24 | Discharge: 2018-05-24 | Disposition: A | Discharge status: Home / Self Care | Payer: Medicare Other | Source: Ambulatory Visit | Attending: Radiation Oncology | Admitting: Radiation Oncology

## 2018-05-24 ENCOUNTER — Encounter: Payer: Self-pay | Admitting: Radiation Oncology

## 2018-05-24 ENCOUNTER — Other Ambulatory Visit: Payer: Self-pay

## 2018-05-24 VITALS — BP 154/79 | HR 78 | Temp 95.5°F | Resp 18 | Wt 226.0 lb

## 2018-05-24 DIAGNOSIS — Z923 Personal history of irradiation: Secondary | ICD-10-CM | POA: Insufficient documentation

## 2018-05-24 DIAGNOSIS — Z8572 Personal history of non-Hodgkin lymphomas: Secondary | ICD-10-CM | POA: Diagnosis present

## 2018-05-24 DIAGNOSIS — C8334 Diffuse large B-cell lymphoma, lymph nodes of axilla and upper limb: Secondary | ICD-10-CM

## 2018-05-24 NOTE — Progress Notes (Signed)
Radiation Oncology Follow up Note  Name: Henry [name]   Date:   05/24/2018 MRN:  [id number] DOB: 1945-06-15    This 73 y.o. male presents to the clinic today for 1 year follow-up status post adjuvant radiation therapy to his left axilla for stage I diffuse large B-cell lymphoma.  REFERRING PROVIDER: Jerrol Banana.,*  HPI: patient is a 73 year old male now out 1 year having completed whole R CHOP chemotherapy for 3 cycles then involved field radiation therapy for a stage I diffuse large B-cell lymphoma. Seen today in routine follow-up he is doing well specifically denies any B symptoms no fever chills or night sweats. His weight is stable. Recent PET CT scan shows no evidence of active disease..he still has some minor peripheral neuropathy.  COMPLICATIONS OF TREATMENT: none  FOLLOW UP COMPLIANCE: keeps appointments   PHYSICAL EXAM:  BP (!) 154/79 (BP Location: Left Arm, Patient Position: Sitting)   Pulse 78   Temp (!) 95.5 F (35.3 C) (Tympanic)   Resp 18   Wt 225 lb 15.5 oz (102.5 kg)   BMI 29.81 kg/m  Well-developed well-nourished patient in NAD. HEENT reveals PERLA, EOMI, discs not visualized.  Oral cavity is clear. No oral mucosal lesions are identified. Neck is clear without evidence of cervical or supraclavicular adenopathy. Lungs are clear to A&P. Cardiac examination is essentially unremarkable with regular rate and rhythm without murmur rub or thrill. Abdomen is benign with no organomegaly or masses noted. Motor sensory and DTR levels are equal and symmetric in the upper and lower extremities. Cranial nerves II through XII are grossly intact. Proprioception is intact. No peripheral adenopathy or edema is identified. No motor or sensory levels are noted. Crude visual fields are within normal range.  RADIOLOGY RESULTS: PET CT scan reviewed and compatible above-stated findings showing no evidence of disease  PLAN: present time patient continues to do well with no evidence  of disease. I'm please was overall progress. I've asked to see him back in 1 year for follow-up. He is scheduled to have his port removed. Patient knows to call with any concerns at any time.  I would like to take this opportunity to thank you for allowing me to participate in the care of your patient.Noreene Filbert, MD

## 2018-06-27 ENCOUNTER — Ambulatory Visit: Payer: Medicare Other | Admitting: General Surgery

## 2018-06-27 ENCOUNTER — Other Ambulatory Visit: Payer: Self-pay

## 2018-06-27 ENCOUNTER — Encounter: Payer: Self-pay | Admitting: General Surgery

## 2018-06-27 VITALS — BP 128/78 | HR 71 | Resp 16 | Ht 66.0 in | Wt 226.0 lb

## 2018-06-27 DIAGNOSIS — C8334 Diffuse large B-cell lymphoma, lymph nodes of axilla and upper limb: Secondary | ICD-10-CM | POA: Diagnosis not present

## 2018-06-27 NOTE — Patient Instructions (Signed)
May shower May remove dressing in 2-3 days Steri strips will gradually come off over 2-3 weeks May use an Ice pack as needed for comfort  

## 2018-06-27 NOTE — Progress Notes (Signed)
Patient ID: Henry [name], male   DOB: February 27, 1946, 73 y.o.   MRN: [id number]  Chief Complaint  Patient presents with  . Procedure    port removal    HPI Henry [name] is a 73 y.o. male.  Here for port removal, former patient of Dr Henry [name].  HPI  Past Medical History:  Diagnosis Date  . Allergy   . Arthritis   . Cancer ([name])    left axillary  . Cataract   . GERD (gastroesophageal reflux disease)   . Hyperlipidemia   . Hypertension     Past Surgical History:  Procedure Laterality Date  . CATARACT EXTRACTION Bilateral   . MOLE REMOVAL    . NECK MASS EXCISION    . PORTACATH PLACEMENT N/A 12/29/2016   Procedure: INSERTION PORT-A-CATH;  Surgeon: Henry Lye, MD;  Location: [facility] ORS;  Service: General;  Laterality: N/A;  . TONSILLECTOMY      Family History  Problem Relation Age of Onset  . Heart disease Mother   . Rheumatic fever Mother   . CAD Mother   . Heart attack Father   . COPD Sister     Social History Social History   Tobacco Use  . Smoking status: Never Smoker  . Smokeless tobacco: Never Used  Substance Use Topics  . Alcohol use: Yes    Alcohol/week: 5.0 standard drinks    Types: 5 Cans of beer per week  . Drug use: No    Allergies  Allergen Reactions  . Lovastatin Other (See Comments)  . Pravastatin Nausea And Vomiting    joint ache  . Pravastatin Sodium     joint ache  . Simvastatin Other (See Comments)    muscle ache muscle ache  . Statins   . Sulfa Antibiotics Rash and Itching    Current Outpatient Medications  Medication Sig Dispense Refill  . amLODipine (NORVASC) 5 MG tablet TAKE ONE TABLET BY MOUTH DAILY 90 tablet 2  . aspirin EC 81 MG tablet Take 81 mg by mouth daily.    . cetirizine (ZYRTEC) 10 MG tablet Take 10 mg by mouth daily.    . cholecalciferol (VITAMIN D) 1000 UNITS tablet Take 1,000 Units by mouth daily.     Marland Kitchen docusate sodium (COLACE) 100 MG capsule Take 200 mg by mouth daily.     Marland Kitchen ibuprofen (ADVIL,MOTRIN)  200 MG tablet Take 800 mg by mouth every 8 (eight) hours as needed (for pain).    Marland Kitchen lisinopril (PRINIVIL,ZESTRIL) 40 MG tablet TAKE 1 TABLET (40 MG TOTAL) BY MOUTH DAILY. 78 tablet 2  . omeprazole (PRILOSEC) 20 MG capsule TAKE 1 CAPSULE (20 MG TOTAL) BY MOUTH DAILY. 73 capsule 1  . phenylephrine-shark liver oil-mineral oil-petrolatum (PREPARATION H) 0.25-14-74.9 % rectal ointment Place 1 application rectally 2 (two) times daily as needed for hemorrhoids.    . lidocaine-prilocaine (EMLA) cream Apply 1 application topically as needed. To port a cath (Patient not taking: Reported on 06/27/2018) 30 g 3   No current facility-administered medications for this visit.     Review of Systems Review of Systems  Constitutional: Negative.   Respiratory: Negative.   Cardiovascular: Negative.     Blood pressure 128/78, pulse 71, resp. rate 16, height 5\' 6"  (1.676 m), weight 226 lb (102.5 kg), SpO2 96 %.  Physical Exam Physical Exam Constitutional:      Appearance: Normal appearance.  Chest:    Skin:    General: Skin is warm.  Neurological:  Mental Status: He is alert and oriented to person, place, and time.  Psychiatric:        Mood and Affect: Mood normal.     Data Reviewed May 16, 2018 medical oncology note reviewed.  Okay to remove port.  Assessment    Completion of therapy for lymphoma.    Plan    The procedure was reviewed and the patient was amenable to proceed.  ChloraPrep was applied to the skin after removing hair with clippers.  10 cc of 0.5% Xylocaine with 0.25% Marcaine with 1-200,000 notes of his epinephrine was utilized and well-tolerated.  The area was recleansed with ChloraPrep and draped.  The original incision was opened.  The port was exposed.  Previously placed transfixion sutures of Prolene were removed x3.  The port was extracted with an intact tip without difficulty.  The pocket was closed with a running 3-0 Vicryl suture.  The skin was closed with a running  3-0 Vicryl subcuticular suture.  Benzoin and Steri-Strip followed by Telfa and Tegaderm dressing applied.  Ice pack provided.  Postoperative wound care reviewed with the patient and his wife.  Follow-up will be on an as-needed basis.   Follow up as needed     HPI, assessment, plan and physical exam has been scribed under the direction and in the presence of Robert Bellow, MD. Karie Fetch, RN   Forest Gleason  06/27/2018, 12:53 PM

## 2018-07-03 ENCOUNTER — Ambulatory Visit: Payer: Self-pay | Admitting: Internal Medicine

## 2018-07-03 ENCOUNTER — Other Ambulatory Visit: Payer: Self-pay

## 2018-08-24 ENCOUNTER — Ambulatory Visit: Payer: Self-pay

## 2018-08-28 ENCOUNTER — Ambulatory Visit (INDEPENDENT_AMBULATORY_CARE_PROVIDER_SITE_OTHER): Payer: Medicare Other

## 2018-08-28 ENCOUNTER — Other Ambulatory Visit: Payer: Self-pay

## 2018-08-28 DIAGNOSIS — Z0000 Encounter for general adult medical examination without abnormal findings: Secondary | ICD-10-CM | POA: Diagnosis not present

## 2018-08-28 NOTE — Patient Instructions (Addendum)
[name] , Thank you for taking time to come for your Medicare Wellness Visit. I appreciate your ongoing commitment to your health goals. Please review the following plan we discussed and let me know if I can assist you in the future.   Screening recommendations/referrals: Colonoscopy: Up to date, due 12/2026 Recommended yearly ophthalmology/optometry visit for glaucoma screening and checkup Recommended yearly dental visit for hygiene and checkup  Vaccinations: Influenza vaccine: Up to date Pneumococcal vaccine: Completed series Tdap vaccine: Pt declines today.  Shingles vaccine: Completed series    Advanced directives: Please bring a copy of your POA (Power of Attorney) and/or Living Will to your next appointment.   Conditions/risks identified: Continue trying to increase water intake to 6-8 8 oz glasses a day.   Next appointment: 09/18/18 @ 9:00 AM with Dr Rosanna Randy.   Preventive Care 71 Years and Older, Male Preventive care refers to lifestyle choices and visits with your health care provider that can promote health and wellness. What does preventive care include?  A yearly physical exam. This is also called an annual well check.  Dental exams once or twice a year.  Routine eye exams. Ask your health care provider how often you should have your eyes checked.  Personal lifestyle choices, including:  Daily care of your teeth and gums.  Regular physical activity.  Eating a healthy diet.  Avoiding tobacco and drug use.  Limiting alcohol use.  Practicing safe sex.  Taking low doses of aspirin every day.  Taking vitamin and mineral supplements as recommended by your health care provider. What happens during an annual well check? The services and screenings done by your health care provider during your annual well check will depend on your age, overall health, lifestyle risk factors, and family history of disease. Counseling  Your health care provider may ask you questions  about your:  Alcohol use.  Tobacco use.  Drug use.  Emotional well-being.  Home and relationship well-being.  Sexual activity.  Eating habits.  History of falls.  Memory and ability to understand (cognition).  Work and work Statistician. Screening  You may have the following tests or measurements:  Height, weight, and BMI.  Blood pressure.  Lipid and cholesterol levels. These may be checked every 5 years, or more frequently if you are over 1 years old.  Skin check.  Lung cancer screening. You may have this screening every year starting at age 10 if you have a 30-pack-year history of smoking and currently smoke or have quit within the past 15 years.  Fecal occult blood test (FOBT) of the stool. You may have this test every year starting at age 68.  Flexible sigmoidoscopy or colonoscopy. You may have a sigmoidoscopy every 5 years or a colonoscopy every 10 years starting at age 56.  Prostate cancer screening. Recommendations will vary depending on your family history and other risks.  Hepatitis C blood test.  Hepatitis B blood test.  Sexually transmitted disease (STD) testing.  Diabetes screening. This is done by checking your blood sugar (glucose) after you have not eaten for a while (fasting). You may have this done every 1-3 years.  Abdominal aortic aneurysm (AAA) screening. You may need this if you are a current or former smoker.  Osteoporosis. You may be screened starting at age 49 if you are at high risk. Talk with your health care provider about your test results, treatment options, and if necessary, the need for more tests. Vaccines  Your health care provider may recommend  certain vaccines, such as:  Influenza vaccine. This is recommended every year.  Tetanus, diphtheria, and acellular pertussis (Tdap, Td) vaccine. You may need a Td booster every 10 years.  Zoster vaccine. You may need this after age 34.  Pneumococcal 13-valent conjugate (PCV13)  vaccine. One dose is recommended after age 34.  Pneumococcal polysaccharide (PPSV23) vaccine. One dose is recommended after age 17. Talk to your health care provider about which screenings and vaccines you need and how often you need them. This information is not intended to replace advice given to you by your health care provider. Make sure you discuss any questions you have with your health care provider. Document Released: 05/30/2015 Document Revised: 01/21/2016 Document Reviewed: 03/04/2015 Elsevier Interactive Patient Education  2017 [employer] Prevention in the Home Falls can cause injuries. They can happen to people of all ages. There are many things you can do to make your home safe and to help prevent falls. What can I do on the outside of my home?  Regularly fix the edges of walkways and driveways and fix any cracks.  Remove anything that might make you trip as you walk through a door, such as a raised step or threshold.  Trim any bushes or trees on the path to your home.  Use bright outdoor lighting.  Clear any walking paths of anything that might make someone trip, such as rocks or tools.  Regularly check to see if handrails are loose or broken. Make sure that both sides of any steps have handrails.  Any raised decks and porches should have guardrails on the edges.  Have any leaves, snow, or ice cleared regularly.  Use sand or salt on walking paths during winter.  Clean up any spills in your garage right away. This includes oil or grease spills. What can I do in the bathroom?  Use night lights.  Install grab bars by the toilet and in the tub and shower. Do not use towel bars as grab bars.  Use non-skid mats or decals in the tub or shower.  If you need to sit down in the shower, use a plastic, non-slip stool.  Keep the floor dry. Clean up any water that spills on the floor as soon as it happens.  Remove soap buildup in the tub or shower regularly.   Attach bath mats securely with double-sided non-slip rug tape.  Do not have throw rugs and other things on the floor that can make you trip. What can I do in the bedroom?  Use night lights.  Make sure that you have a light by your bed that is easy to reach.  Do not use any sheets or blankets that are too big for your bed. They should not hang down onto the floor.  Have a firm chair that has side arms. You can use this for support while you get dressed.  Do not have throw rugs and other things on the floor that can make you trip. What can I do in the kitchen?  Clean up any spills right away.  Avoid walking on wet floors.  Keep items that you use a lot in easy-to-reach places.  If you need to reach something above you, use a strong step stool that has a grab bar.  Keep electrical cords out of the way.  Do not use floor polish or wax that makes floors slippery. If you must use wax, use non-skid floor wax.  Do not have throw rugs and other  things on the floor that can make you trip. What can I do with my stairs?  Do not leave any items on the stairs.  Make sure that there are handrails on both sides of the stairs and use them. Fix handrails that are broken or loose. Make sure that handrails are as long as the stairways.  Check any carpeting to make sure that it is firmly attached to the stairs. Fix any carpet that is loose or worn.  Avoid having throw rugs at the top or bottom of the stairs. If you do have throw rugs, attach them to the floor with carpet tape.  Make sure that you have a light switch at the top of the stairs and the bottom of the stairs. If you do not have them, ask someone to add them for you. What else can I do to help prevent falls?  Wear shoes that:  Do not have high heels.  Have rubber bottoms.  Are comfortable and fit you well.  Are closed at the toe. Do not wear sandals.  If you use a stepladder:  Make sure that it is fully opened. Do not climb  a closed stepladder.  Make sure that both sides of the stepladder are locked into place.  Ask someone to hold it for you, if possible.  Clearly mark and make sure that you can see:  Any grab bars or handrails.  First and last steps.  Where the edge of each step is.  Use tools that help you move around (mobility aids) if they are needed. These include:  Canes.  Walkers.  Scooters.  Crutches.  Turn on the lights when you go into a dark area. Replace any light bulbs as soon as they burn out.  Set up your furniture so you have a clear path. Avoid moving your furniture around.  If any of your floors are uneven, fix them.  If there are any pets around you, be aware of where they are.  Review your medicines with your doctor. Some medicines can make you feel dizzy. This can increase your chance of falling. Ask your doctor what other things that you can do to help prevent falls. This information is not intended to replace advice given to you by your health care provider. Make sure you discuss any questions you have with your health care provider. Document Released: 02/27/2009 Document Revised: 10/09/2015 Document Reviewed: 06/07/2014 Elsevier Interactive Patient Education  2017 [employer].

## 2018-08-28 NOTE — Progress Notes (Signed)
Subjective:   Henry [name] is a 73 y.o. male who presents for Medicare Annual/Subsequent preventive examination.  This visit is being conducted via telephone due to the COVID-19 pandemic. This patient has given me verbal consent via telephone to conduct this visit. Some vital signs may be absent or patient reported.  Patient identification: identified by name, DOB, and current address.     Review of Systems:  N/A  Cardiac Risk Factors include: advanced age (>35men, >65 women);dyslipidemia;hypertension;male gender;obesity (BMI >30kg/m2)     Objective:    Vitals: There were no vitals taken for this visit.  There is no height or weight on file to calculate BMI. Unable to obtain vitals due to this visit being completed via virtual.   Advanced Directives 08/28/2018 05/24/2018 05/16/2018 05/01/2018 01/09/2018 11/04/2017 09/07/2017  Does Patient Have a Medical Advance Directive? Yes Yes Yes Yes Yes Yes Yes  Type of Paramedic of [place];Living will [place];Living will Living will;Healthcare Power of [place];Living will Living will;Healthcare Power of [place];Living will Living will;Healthcare Power of Attorney  Does patient want to make changes to medical advance directive? - No - Patient declined No - Patient declined No - Patient declined No - Patient declined - No - Patient declined  Copy of [place] in Chart? No - copy requested No - copy requested No - copy requested No - copy requested No - copy requested No - copy requested No - copy requested  Would patient like information on creating a medical advance directive? - No - Patient declined - No - Patient declined - No - Patient declined -    Tobacco Social History   Tobacco Use  Smoking Status Never Smoker  Smokeless Tobacco Never Used     Counseling given: Not Answered   Clinical Intake:  Pre-visit  preparation completed: Yes  Pain : No/denies pain Pain Score: 0-No pain     Nutritional Status: BMI > 30  Obese Nutritional Risks: None Diabetes: No  How often do you need to have someone help you when you read instructions, pamphlets, or other written materials from your doctor or pharmacy?: 1 - Never  Interpreter Needed?: No  Information entered by :: [facility], LPN  Past Medical History:  Diagnosis Date  . Allergy   . Arthritis   . 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 Cataract   . GERD (gastroesophageal reflux disease)   . Hyperlipidemia   . Hypertension    Past Surgical History:  Procedure Laterality Date  . CATARACT EXTRACTION Bilateral   . MOLE REMOVAL    . NECK MASS EXCISION    . PORTACATH PLACEMENT N/A 12/29/2016   Procedure: INSERTION PORT-A-CATH;  Surgeon: Christene Lye, MD;  Location: [facility] ORS;  Service: General;  Laterality: N/A;  . TONSILLECTOMY     Family History  Problem Relation Age of Onset  . Heart disease Mother   . Rheumatic fever Mother   . CAD Mother   . Heart attack Father   . COPD Sister    Social History   Socioeconomic History  . Marital status: Married    Spouse name: Not on file  . Number of children: 2  . Years of education: Not on file  . Highest education level: Some college, no degree  Occupational History  . Occupation: retired  Scientific laboratory technician  . Financial resource strain: Not hard at all  . Food insecurity:  Worry: Never true    Inability: Never true  . Transportation needs:    Medical: No    Non-medical: No  Tobacco Use  . Smoking status: Never Smoker  . Smokeless tobacco: Never Used  Substance and Sexual Activity  . Alcohol use: Yes    Alcohol/week: 5.0 standard drinks    Types: 5 Cans of beer per week  . Drug use: No  . Sexual activity: Not on file  Lifestyle  . Physical activity:    Days per week: 0 days    Minutes per session: 0 min  . Stress: Not at all  Relationships  . Social connections:     Talks on phone: Patient refused    Gets together: Patient refused    Attends religious service: Patient refused    Active member of club or organization: Patient refused    Attends meetings of clubs or organizations: Patient refused    Relationship status: Patient refused  Other Topics Concern  . Not on file  Social History Narrative  . Not on file    Outpatient Encounter Medications as of 08/28/2018  Medication Sig  . amLODipine (NORVASC) 5 MG tablet TAKE ONE TABLET BY MOUTH DAILY  . aspirin EC 81 MG tablet Take 81 mg by mouth daily.  . cetirizine (ZYRTEC) 10 MG tablet Take 10 mg by mouth daily.  . cholecalciferol (VITAMIN D) 1000 UNITS tablet Take 1,000 Units by mouth daily.   Marland Kitchen docusate sodium (COLACE) 100 MG capsule Take 200 mg by mouth daily.   Marland Kitchen ibuprofen (ADVIL,MOTRIN) 200 MG tablet Take 800 mg by mouth every 8 (eight) hours as needed (for pain).  Marland Kitchen lisinopril (PRINIVIL,ZESTRIL) 40 MG tablet TAKE 1 TABLET (40 MG TOTAL) BY MOUTH DAILY.  Marland Kitchen omeprazole (PRILOSEC) 20 MG capsule TAKE 1 CAPSULE (20 MG TOTAL) BY MOUTH DAILY.  Marland Kitchen phenylephrine-shark liver oil-mineral oil-petrolatum (PREPARATION H) 0.25-14-74.9 % rectal ointment Place 1 application rectally 2 (two) times daily as needed for hemorrhoids.  . vitamin C (ASCORBIC ACID) 500 MG tablet Take 500 mg by mouth daily.  Marland Kitchen lidocaine-prilocaine (EMLA) cream Apply 1 application topically as needed. To port a cath (Patient not taking: Reported on 06/27/2018)   No facility-administered encounter medications on file as of 08/28/2018.     Activities of Daily Living In your present state of health, do you have any difficulty performing the following activities: 08/28/2018  Hearing? N  Vision? N  Difficulty concentrating or making decisions? N  Walking or climbing stairs? N  Dressing or bathing? N  Doing errands, shopping? N  Preparing Food and eating ? N  Using the Toilet? N  In the past six months, have you accidently leaked urine? N  Do  you have problems with loss of bowel control? N  Managing your Medications? N  Managing your Finances? N  Housekeeping or managing your Housekeeping? N  Some recent data might be hidden    Patient Care Team: Jerrol Banana., MD as PCP - General (Family Medicine) Cammie Sickle, MD as Consulting Physician (Medical Oncology) Noreene Filbert, MD as Referring Physician (Radiation Oncology) Anell Barr, OD as Consulting Physician (Optometry)   Assessment:   This is a routine wellness examination for Rayyan.  Exercise Activities and Dietary recommendations Current Exercise Habits: The patient does not participate in regular exercise at present, Exercise limited by: None identified  Goals    . DIET - INCREASE WATER INTAKE     Recommend increasing water intake to 4-6 glasses  a day.        Fall Risk: Fall Risk  08/28/2018 06/27/2018 11/04/2017 08/22/2017 05/13/2017  Falls in the past year? 0 0 No No No  Follow up - Falls evaluation completed - - -    FALL RISK PREVENTION PERTAINING TO THE HOME:  Any stairs in or around the home? Yes  If so, are there any without handrails? No   Home free of loose throw rugs in walkways, pet beds, electrical cords, etc? Yes  Adequate lighting in your home to reduce risk of falls? Yes   ASSISTIVE DEVICES UTILIZED TO PREVENT FALLS:  Life alert? No  Use of a cane, walker or w/c? No  Grab bars in the bathroom? Yes  Shower chair or bench in shower? Yes  Elevated toilet seat or a handicapped toilet? Yes   TIMED UP AND GO:  Was the test performed? No .    Depression Screen PHQ 2/9 Scores 08/28/2018 11/04/2017 08/22/2017 05/13/2017  PHQ - 2 Score 0 0 0 0  PHQ- 9 Score - - - -    Cognitive Function: Pt declined today.      6CIT Screen 08/03/2016  What Year? 0 points  What month? 0 points  What time? 0 points  Count back from 20 0 points  Months in reverse 0 points  Repeat phrase 6 points  Total Score 6    Immunization  History  Administered Date(s) Administered  . Influenza Split 04/01/2007, 02/20/2010, 03/25/2011, 03/30/2012  . Influenza, High Dose Seasonal PF 02/19/2014, 03/05/2015, 03/18/2016, 02/21/2018  . Influenza,inj,Quad PF,6+ Mos 02/21/2013, 03/09/2017  . Pneumococcal Conjugate-13 04/24/2013  . Pneumococcal Polysaccharide-23 05/16/2014  . Tdap 01/19/2005  . Zoster 03/30/2012  . Zoster Recombinat (Shingrix) 12/07/2017, 04/01/2018    Qualifies for Shingles Vaccine? Up to date  Tdap: Although this vaccine is not a covered service during a Wellness Exam, does the patient still wish to receive this vaccine today?  No .  Education has been provided regarding the importance of this vaccine. Advised may receive this vaccine at local pharmacy or Health Dept. Aware to provide a copy of the vaccination record if obtained from local pharmacy or Health Dept. Verbalized acceptance and understanding.  Flu Vaccine: Up to date  Pneumococcal Vaccine: Up to date  Screening Tests Health Maintenance  Topic Date Due  . TETANUS/TDAP  01/20/2015  . INFLUENZA VACCINE  12/16/2018  . COLONOSCOPY  12/16/2020  . Hepatitis C Screening  Completed  . PNA vac Low Risk Adult  Completed   Cancer Screenings:  Colorectal Screening: Completed 01/14/17. Repeat every 10 years.  Lung Cancer Screening: (Low Dose CT Chest recommended if Age 30-80 years, 30 pack-year currently smoking OR have quit w/in 15years.) does not qualify.   Additional Screening:  Hepatitis C Screening: Up to date  Vision Screening: Recommended annual ophthalmology exams for early detection of glaucoma and other disorders of the eye.  Dental Screening: Recommended annual dental exams for proper oral hygiene  Community Resource Referral:  CRR required this visit?  No        Plan:  I have personally reviewed and addressed the Medicare Annual Wellness questionnaire and have noted the following in the patient's chart:  A. Medical and social  history B. Use of alcohol, tobacco or illicit drugs  C. Current medications and supplements D. Functional ability and status E.  Nutritional status F.  Physical activity G. Advance directives H. List of other physicians I.  Hospitalizations, surgeries, and ER visits in previous 35  months J.  Vitals K. Screenings such as hearing and vision if needed, cognitive and depression L. Referrals and appointments   In addition, I have reviewed and discussed with patient certain preventive protocols, quality metrics, and best practice recommendations. A written personalized care plan for preventive services as well as general preventive health recommendations were provided to patient.   Glendora Score, [place]  08/17/7541 Nurse Health Advisor   Nurse Notes: Pt declined the tetanus vaccine today.

## 2018-08-31 ENCOUNTER — Other Ambulatory Visit: Payer: Self-pay | Admitting: Family Medicine

## 2018-09-07 ENCOUNTER — Telehealth: Payer: Self-pay | Admitting: *Deleted

## 2018-09-07 NOTE — Telephone Encounter (Signed)
Pt does not want to do web visit, knot under the arm and would like to be seen in clinic. Informed Heather of pt conversation.

## 2018-09-07 NOTE — Telephone Encounter (Signed)
Left vm for patient to discuss the need for a virtual visit next Tuesday 4/28

## 2018-09-08 NOTE — Telephone Encounter (Signed)
Patient does not want to change his apt. He wants a face to face.

## 2018-09-11 ENCOUNTER — Other Ambulatory Visit: Payer: Self-pay

## 2018-09-12 ENCOUNTER — Other Ambulatory Visit: Payer: Self-pay

## 2018-09-12 ENCOUNTER — Inpatient Hospital Stay: Payer: Medicare Other

## 2018-09-12 ENCOUNTER — Inpatient Hospital Stay: Payer: Medicare Other | Attending: Internal Medicine | Admitting: Internal Medicine

## 2018-09-12 DIAGNOSIS — G629 Polyneuropathy, unspecified: Secondary | ICD-10-CM

## 2018-09-12 DIAGNOSIS — C8334 Diffuse large B-cell lymphoma, lymph nodes of axilla and upper limb: Secondary | ICD-10-CM | POA: Diagnosis not present

## 2018-09-12 DIAGNOSIS — N181 Chronic kidney disease, stage 1: Secondary | ICD-10-CM | POA: Diagnosis not present

## 2018-09-12 LAB — COMPREHENSIVE METABOLIC PANEL
ALT: 15 U/L (ref 0–44)
AST: 19 U/L (ref 15–41)
Albumin: 3.9 g/dL (ref 3.5–5.0)
Alkaline Phosphatase: 63 U/L (ref 38–126)
Anion gap: 7 (ref 5–15)
BUN: 18 mg/dL (ref 8–23)
CO2: 25 mmol/L (ref 22–32)
Calcium: 9.1 mg/dL (ref 8.9–10.3)
Chloride: 111 mmol/L (ref 98–111)
Creatinine, Ser: 1.41 mg/dL — ABNORMAL HIGH (ref 0.61–1.24)
GFR calc Af Amer: 58 mL/min — ABNORMAL LOW (ref >60)
GFR calc non Af Amer: 50 mL/min — ABNORMAL LOW (ref >60)
Glucose, Bld: 116 mg/dL — ABNORMAL HIGH (ref 70–99)
Potassium: 4.2 mmol/L (ref 3.5–5.1)
Sodium: 143 mmol/L (ref 135–145)
Total Bilirubin: 0.8 mg/dL (ref 0.3–1.2)
Total Protein: 6.8 g/dL (ref 6.5–8.1)

## 2018-09-12 LAB — CBC WITH DIFFERENTIAL/PLATELET
Abs Immature Granulocytes: 0.02 10*3/uL (ref 0.00–0.07)
Basophils Absolute: 0.1 10*3/uL (ref 0.0–0.1)
Basophils Relative: 1 %
Eosinophils Absolute: 0.2 10*3/uL (ref 0.0–0.5)
Eosinophils Relative: 4 %
HCT: 42.9 % (ref 39.0–52.0)
Hemoglobin: 14.6 g/dL (ref 13.0–17.0)
Immature Granulocytes: 0 %
Lymphocytes Relative: 17 %
Lymphs Abs: 0.9 10*3/uL (ref 0.7–4.0)
MCH: 31.3 pg (ref 26.0–34.0)
MCHC: 34 g/dL (ref 30.0–36.0)
MCV: 91.9 fL (ref 80.0–100.0)
Monocytes Absolute: 0.5 10*3/uL (ref 0.1–1.0)
Monocytes Relative: 8 %
Neutro Abs: 3.9 10*3/uL (ref 1.7–7.7)
Neutrophils Relative %: 70 %
Platelets: 198 10*3/uL (ref 150–400)
RBC: 4.67 MIL/uL (ref 4.22–5.81)
RDW: 13 % (ref 11.5–15.5)
WBC: 5.6 10*3/uL (ref 4.0–10.5)
nRBC: 0 % (ref 0.0–0.2)

## 2018-09-12 NOTE — Assessment & Plan Note (Addendum)
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  December 2019-PET scan shows no significant uptake or any concerns for pathologic lymphadenopathy. Clinically stable.   #  No evidence of progression or recurrence; however we will get PET in 3 months given the continued left Ax LN.   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   # CKD stage I creatinine 1.2-1.4-;stable.   # DISPOSITION: # follow up in 3 months-MD/cbc/cmp/ldh/ PET prior-Dr.B

## 2018-09-12 NOTE — Progress Notes (Signed)
[place] OFFICE PROGRESS NOTE  Patient Care Team: Jerrol Banana., MD as PCP - General (Family Medicine) Cammie Sickle, MD as Consulting Physician (Medical Oncology) Noreene Filbert, MD as Referring Physician (Radiation Oncology) Anell Barr, OD as Consulting Physician (Optometry)  Cancer Staging No matching staging information was found for the patient.   Oncology History   # AUG 6th 2018- DLBCL STAGE I [LEFT axilla- Core Bx- Dr.Sankar; CD-20, bcl-2;bcl-6 positive]; Ki- 67 >90%. MUM-1 NEG. GCB subtype; Myc- POS; FISH studies-NEGATIVE for gene RE-ARRANGEMENTS [positive for extra-copies of bcl-6/ myc/IgH/bcl-2]PET scan- Right Ax LN [4.5cm; SUV 35]  # R-CHOP [aug 20th 2018] x3 cycles; RT [starting 10/25; Nov 266 2018]; DEc 2018- CR ; except 8 mm left ax LN- no uptake.   # AUG 13th 2018. MUGA scan-56%.   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  male pleasant patient above history of diffuse large B-cell lymphoma is here for follow-up.  In the interim patient had his port taken out.  Patient continues to feel left underarm lymph nodes.  Denies any pain.  Denies the need for getting worse.  No lymphadenopathy results.  Fatigue is improving.  No swelling in the legs.  Mild numbness in the fingertips.  Review of Systems  Constitutional: Positive for malaise/fatigue. Negative for chills, diaphoresis, fever and weight loss.  HENT: Negative for nosebleeds and sore throat.   Eyes: Negative for double vision.  Respiratory: Negative for cough, hemoptysis, sputum production, shortness of breath and wheezing.   Cardiovascular: Negative for chest pain, palpitations, orthopnea and leg swelling.  Gastrointestinal: Negative for abdominal pain, blood in stool, constipation, diarrhea,  heartburn, melena, nausea and vomiting.  Genitourinary: Negative for dysuria, frequency and urgency.  Musculoskeletal: Negative for back pain and joint pain.  Skin: Negative.  Negative for itching and rash.  Neurological: Positive for tingling (Mild tingling and numbness of the fingertips). Negative for dizziness, focal weakness, weakness and headaches.  Endo/Heme/Allergies: Does not bruise/bleed easily.  Psychiatric/Behavioral: Negative for depression. The patient is not nervous/anxious and does not have insomnia.       PAST MEDICAL HISTORY :  Past Medical History:  Diagnosis Date  . Allergy   . Arthritis   . Cancer ([place])    left axillary  . Cataract   . GERD (gastroesophageal reflux disease)   . Hyperlipidemia   . Hypertension     PAST SURGICAL HISTORY :   Past Surgical History:  Procedure Laterality Date  . CATARACT EXTRACTION Bilateral   . MOLE REMOVAL    . NECK MASS EXCISION    . PORTACATH PLACEMENT N/A 12/29/2016   Procedure: INSERTION PORT-A-CATH;  Surgeon: Christene Lye, MD;  Location: [facility] ORS;  Service: General;  Laterality: N/A;  . TONSILLECTOMY      FAMILY HISTORY :   Family History  Problem Relation Age of Onset  . Heart disease Mother   . Rheumatic fever Mother   . CAD Mother   . Heart attack Father   . COPD Sister     SOCIAL HISTORY:   Social History   Tobacco Use  . Smoking status: Never Smoker  . Smokeless tobacco: Never Used  Substance Use Topics  . Alcohol use: Yes    Alcohol/week: 5.0 standard drinks  Types: 5 Cans of beer per week  . Drug use: No    ALLERGIES:  is allergic to lovastatin; pravastatin; pravastatin sodium; simvastatin; statins; and sulfa antibiotics.  MEDICATIONS:  Current Outpatient Medications  Medication Sig Dispense Refill  . amLODipine (NORVASC) 5 MG tablet TAKE ONE TABLET BY MOUTH DAILY 90 tablet 2  . aspirin EC 81 MG tablet Take 81 mg by mouth daily.    . cetirizine (ZYRTEC) 10 MG tablet Take 10 mg by  mouth daily.    . cholecalciferol (VITAMIN D) 1000 UNITS tablet Take 1,000 Units by mouth daily.     Marland Kitchen docusate sodium (COLACE) 100 MG capsule Take 200 mg by mouth daily.     Marland Kitchen ibuprofen (ADVIL,MOTRIN) 200 MG tablet Take 800 mg by mouth every 8 (eight) hours as needed (for pain).    Marland Kitchen lisinopril (PRINIVIL,ZESTRIL) 40 MG tablet TAKE 1 TABLET (40 MG TOTAL) BY MOUTH DAILY. 78 tablet 2  . omeprazole (PRILOSEC) 20 MG capsule TAKE 1 CAPSULE (20 MG TOTAL) BY MOUTH DAILY. 90 capsule 0  . phenylephrine-shark liver oil-mineral oil-petrolatum (PREPARATION H) 0.25-14-74.9 % rectal ointment Place 1 application rectally 2 (two) times daily as needed for hemorrhoids.    . vitamin C (ASCORBIC ACID) 500 MG tablet Take 500 mg by mouth daily.    Marland Kitchen lidocaine-prilocaine (EMLA) cream Apply 1 application topically as needed. To port a cath (Patient not taking: Reported on 06/27/2018) 30 g 3   No current facility-administered medications for this visit.     PHYSICAL EXAMINATION: ECOG PERFORMANCE STATUS: 0 - Asymptomatic  There were no vitals taken for this visit.  There were no vitals filed for this visit.  Physical Exam  Constitutional: He is oriented to person, place, and time and well-developed, well-nourished, and in no distress.  HENT:  Head: Normocephalic and atraumatic.  Mouth/Throat: Oropharynx is clear and moist. No oropharyngeal exudate.  Eyes: Pupils are equal, round, and reactive to light.  Neck: Normal range of motion. Neck supple.  Cardiovascular: Normal rate and regular rhythm.  Pulmonary/Chest: No respiratory distress. He has no wheezes.  Abdominal: Soft. Bowel sounds are normal. He exhibits no distension and no mass. There is no abdominal tenderness. There is no rebound and no guarding.  Musculoskeletal: Normal range of motion.        General: No tenderness or edema.  Lymphadenopathy:  Left axillary -Appx 1-2 cm LN Sized rubbery lymph node-stable.  Neurological: He is alert and oriented to  person, place, and time.  Skin: Skin is warm.  Psychiatric: Affect normal.       LABORATORY DATA:  I have reviewed the data as listed    Component Value Date/Time   NA 143 09/12/2018 1002   NA 142 08/23/2017 1128   K 4.2 09/12/2018 1002   CL 111 09/12/2018 1002   CO2 25 09/12/2018 1002   GLUCOSE 116 (H) 09/12/2018 1002   BUN 18 09/12/2018 1002   BUN 17 08/23/2017 1128   CREATININE 1.41 (H) 09/12/2018 1002   CALCIUM 9.1 09/12/2018 1002   PROT 6.8 09/12/2018 1002   PROT 6.6 08/23/2017 1128   ALBUMIN 3.9 09/12/2018 1002   ALBUMIN 4.4 08/23/2017 1128   AST 19 09/12/2018 1002   ALT 15 09/12/2018 1002   ALKPHOS 63 09/12/2018 1002   BILITOT 0.8 09/12/2018 1002   BILITOT 0.6 08/23/2017 1128   GFRNONAA 50 (L) 09/12/2018 1002   GFRAA 58 (L) 09/12/2018 1002    No results found for: SPEP, UPEP  Lab  Results  Component Value Date   WBC 5.6 09/12/2018   NEUTROABS 3.9 09/12/2018   HGB 14.6 09/12/2018   HCT 42.9 09/12/2018   MCV 91.9 09/12/2018   PLT 198 09/12/2018      Chemistry      Component Value Date/Time   NA 143 09/12/2018 1002   NA 142 08/23/2017 1128   K 4.2 09/12/2018 1002   CL 111 09/12/2018 1002   CO2 25 09/12/2018 1002   BUN 18 09/12/2018 1002   BUN 17 08/23/2017 1128   CREATININE 1.41 (H) 09/12/2018 1002   GLU 91 11/06/2013      Component Value Date/Time   CALCIUM 9.1 09/12/2018 1002   ALKPHOS 63 09/12/2018 1002   AST 19 09/12/2018 1002   ALT 15 09/12/2018 1002   BILITOT 0.8 09/12/2018 1002   BILITOT 0.6 08/23/2017 1128       RADIOGRAPHIC STUDIES: I have personally reviewed the radiological images as listed and agreed with the findings in the report. No results found.   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  December 2019-PET scan shows no significant uptake  or any concerns for pathologic lymphadenopathy. Clinically stable.   #  No evidence of progression or recurrence; however we will get PET in 3 months given the continued left Ax LN.   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   # CKD stage I creatinine 1.2-1.4-;stable.   # DISPOSITION: # follow up in 3 months-MD/cbc/cmp/ldh/ PET prior-Dr.B     Orders Placed This Encounter  Procedures  . NM PET Image Restag (PS) Skull Base To Thigh    Standing Status:   Future    Standing Expiration Date:   03/13/2020    Order Specific Question:   ** REASON FOR EXAM (FREE TEXT)    Answer:   DLBCL; left axillary LN    Order Specific Question:   If indicated for the ordered procedure, I authorize the administration of a radiopharmaceutical per Radiology protocol    Answer:   Yes    Order Specific Question:   Preferred imaging location?    Answer:   [facility]    Order Specific Question:   Radiology Contrast Protocol - do NOT remove file path    Answer:   [email or web address]  . CBC with Differential    Standing Status:   Future    Standing Expiration Date:   09/12/2019  . Comprehensive metabolic panel    Standing Status:   Future    Standing Expiration Date:   09/12/2019  . Lactate dehydrogenase    Standing Status:   Future    Standing Expiration Date:   09/12/2019   All questions were answered. The patient knows to call the clinic with any problems, questions or concerns.      Cammie Sickle, MD 09/12/2018 11:48 AM

## 2018-09-12 NOTE — Progress Notes (Signed)
Patient does not offer any problems today.  

## 2018-09-18 ENCOUNTER — Other Ambulatory Visit: Payer: Self-pay

## 2018-09-18 ENCOUNTER — Encounter: Payer: Self-pay | Admitting: Family Medicine

## 2018-09-18 ENCOUNTER — Ambulatory Visit (INDEPENDENT_AMBULATORY_CARE_PROVIDER_SITE_OTHER): Payer: Medicare Other | Admitting: Family Medicine

## 2018-09-18 VITALS — BP 128/68 | HR 70 | Temp 98.4°F | Resp 16 | Wt 221.0 lb

## 2018-09-18 DIAGNOSIS — I10 Essential (primary) hypertension: Secondary | ICD-10-CM | POA: Diagnosis not present

## 2018-09-18 DIAGNOSIS — C8334 Diffuse large B-cell lymphoma, lymph nodes of axilla and upper limb: Secondary | ICD-10-CM | POA: Diagnosis not present

## 2018-09-18 DIAGNOSIS — R42 Dizziness and giddiness: Secondary | ICD-10-CM | POA: Diagnosis not present

## 2018-09-18 DIAGNOSIS — T148XXA Other injury of unspecified body region, initial encounter: Secondary | ICD-10-CM | POA: Diagnosis not present

## 2018-09-18 DIAGNOSIS — Z23 Encounter for immunization: Secondary | ICD-10-CM

## 2018-09-18 DIAGNOSIS — Z0000 Encounter for general adult medical examination without abnormal findings: Secondary | ICD-10-CM | POA: Diagnosis not present

## 2018-09-18 DIAGNOSIS — E7800 Pure hypercholesterolemia, unspecified: Secondary | ICD-10-CM | POA: Diagnosis not present

## 2018-09-18 NOTE — Progress Notes (Addendum)
Patient: Henry [name], Male    DOB: [birth year], 73 y.o.   MRN: [id number] Visit Date: 09/18/2018  Today's Provider: Wilhemena Durie, MD   Chief Complaint  Patient presents with  . Annual Exam   Subjective:   Patient had AWV on 08/28/2018.    Annual physical exam Henry [name] is a 73 y.o. male. He feels well. He reports exercising not regularly, but he does stay active. He reports he is sleeping well.  Colonoscopy- 12/17/2010. Normal. Internal hemorrhoids. Repeat in 5-10 years.   Review of Systems  Constitutional: Negative.   HENT: Negative.   Eyes: Negative.   Respiratory: Negative.   Cardiovascular: Negative.   Gastrointestinal: Negative.   Endocrine: Negative.   Genitourinary: Negative.   Musculoskeletal: Negative.   Skin: Negative.   Allergic/Immunologic: Negative.   Neurological: Positive for light-headedness.  Hematological: Negative.   Psychiatric/Behavioral: Negative.     Social History   Socioeconomic History  . Marital status: Married    Spouse name: Not on file  . Number of children: 2  . Years of education: Not on file  . Highest education level: Some college, no degree  Occupational History  . Occupation: retired  Scientific laboratory technician  . Financial resource strain: Not hard at all  . Food insecurity:    Worry: Never true    Inability: Never true  . Transportation needs:    Medical: No    Non-medical: No  Tobacco Use  . Smoking status: Never Smoker  . Smokeless tobacco: Never Used  Substance and Sexual Activity  . Alcohol use: Yes    Alcohol/week: 5.0 standard drinks    Types: 5 Cans of beer per week  . Drug use: No  . Sexual activity: Not on file  Lifestyle  . Physical activity:    Days per week: 0 days    Minutes per session: 0 min  . Stress: Not at all  Relationships  . Social connections:    Talks on phone: Patient refused    Gets together: Patient refused    Attends religious service: Patient refused    Active member of club  or organization: Patient refused    Attends meetings of clubs or organizations: Patient refused    Relationship status: Patient refused  . Intimate partner violence:    Fear of current or ex partner: Patient refused    Emotionally abused: Patient refused    Physically abused: Patient refused    Forced sexual activity: Patient refused  Other Topics Concern  . Not on file  Social History Narrative  . Not on file    Past Medical History:  Diagnosis Date  . Allergy   . Arthritis   . Cancer ([place])    left axillary  . Cataract   . GERD (gastroesophageal reflux disease)   . Hyperlipidemia   . Hypertension      Patient Active Problem List   Diagnosis Date Noted  . Diffuse large B-cell lymphoma of lymph nodes of axilla ([place]) 12/22/2016  . Low back pain 03/12/2015  . Allergic rhinitis 09/23/2014  . 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 Acid reflux 09/23/2014  . Hypercholesteremia 09/23/2014  . BP (high blood pressure) 09/23/2014  . Arthritis, degenerative 09/23/2014  . Basal cell papilloma 09/23/2014  . Drug intolerance 09/23/2014  . Avitaminosis D 09/23/2014  . Essential (primary) hypertension 06/04/2014  . Hemorrhoid 06/04/2014  . Adaptive colitis 06/04/2014  . Muscle ache 06/04/2014  . Excess weight 06/04/2014  .  Hypercholesterolemia without hypertriglyceridemia 06/04/2014    Past Surgical History:  Procedure Laterality Date  . CATARACT EXTRACTION Bilateral   . MOLE REMOVAL    . NECK MASS EXCISION    . PORTACATH PLACEMENT N/A 12/29/2016   Procedure: INSERTION PORT-A-CATH;  Surgeon: Christene Lye, MD;  Location: [facility] ORS;  Service: General;  Laterality: N/A;  . TONSILLECTOMY      His family history includes CAD in his mother; COPD in his sister; Heart attack in his father; Heart disease in his mother; Rheumatic fever in his mother.   Current Outpatient Medications:  .  amLODipine (NORVASC) 5 MG tablet, TAKE ONE TABLET BY MOUTH DAILY, Disp: 90 tablet,  Rfl: 2 .  aspirin EC 81 MG tablet, Take 81 mg by mouth daily., Disp: , Rfl:  .  cetirizine (ZYRTEC) 10 MG tablet, Take 10 mg by mouth daily., Disp: , Rfl:  .  cholecalciferol (VITAMIN D) 1000 UNITS tablet, Take 1,000 Units by mouth daily. , Disp: , Rfl:  .  docusate sodium (COLACE) 100 MG capsule, Take 200 mg by mouth daily. , Disp: , Rfl:  .  ibuprofen (ADVIL,MOTRIN) 200 MG tablet, Take 800 mg by mouth every 8 (eight) hours as needed (for pain)., Disp: , Rfl:  .  lisinopril (PRINIVIL,ZESTRIL) 40 MG tablet, TAKE 1 TABLET (40 MG TOTAL) BY MOUTH DAILY., Disp: 78 tablet, Rfl: 2 .  omeprazole (PRILOSEC) 20 MG capsule, TAKE 1 CAPSULE (20 MG TOTAL) BY MOUTH DAILY., Disp: 90 capsule, Rfl: 0 .  phenylephrine-shark liver oil-mineral oil-petrolatum (PREPARATION H) 0.25-14-74.9 % rectal ointment, Place 1 application rectally 2 (two) times daily as needed for hemorrhoids., Disp: , Rfl:  .  vitamin C (ASCORBIC ACID) 500 MG tablet, Take 500 mg by mouth daily., Disp: , Rfl:  .  lidocaine-prilocaine (EMLA) cream, Apply 1 application topically as needed. To port a cath (Patient not taking: Reported on 06/27/2018), Disp: 30 g, Rfl: 3  Patient Care Team: Jerrol Banana., MD as PCP - General (Family Medicine) Cammie Sickle, MD as Consulting Physician (Medical Oncology) Noreene Filbert, MD as Referring Physician (Radiation Oncology) Anell Barr, OD as Consulting Physician (Optometry)    Objective:    Vitals: BP 128/68 (BP Location: Left Arm, Patient Position: Sitting, Cuff Size: Large)   Pulse 70   Temp 98.4 F (36.9 C)   Resp 16   Wt 221 lb (100.2 kg)   SpO2 98%   BMI 35.67 kg/m   Physical Exam Vitals signs reviewed.  Constitutional:      Appearance: He is well-developed.  HENT:     Head: Normocephalic and atraumatic.     Right Ear: External ear normal.     Left Ear: External ear normal.     Nose: Nose normal.  Eyes:     General: No scleral icterus.    Conjunctiva/sclera:  Conjunctivae normal.     Pupils: Pupils are equal, round, and reactive to light.  Neck:     Musculoskeletal: Normal range of motion and neck supple.  Cardiovascular:     Rate and Rhythm: Normal rate and regular rhythm.     Heart sounds: Normal heart sounds.  Pulmonary:     Effort: Pulmonary effort is normal.     Breath sounds: Normal breath sounds.  Abdominal:     General: Bowel sounds are normal.     Palpations: Abdomen is soft.  Genitourinary:    Penis: Normal.      Scrotum/Testes: Normal.     Prostate:  Normal.     Rectum: Normal.  Musculoskeletal: Normal range of motion.  Skin:    General: Skin is warm and dry.     Comments: Recent left forearm laceration which is healing well.  Neurological:     General: No focal deficit present.     Mental Status: He is alert and oriented to person, place, and time. Mental status is at baseline.  Psychiatric:        Mood and Affect: Mood normal.        Behavior: Behavior normal.        Thought Content: Thought content normal.        Judgment: Judgment normal.     Activities of Daily Living In your present state of health, do you have any difficulty performing the following activities: 08/28/2018  Hearing? N  Vision? N  Difficulty concentrating or making decisions? N  Walking or climbing stairs? N  Dressing or bathing? N  Doing errands, shopping? N  Preparing Food and eating ? N  Using the Toilet? N  In the past six months, have you accidently leaked urine? N  Do you have problems with loss of bowel control? N  Managing your Medications? N  Managing your Finances? N  Housekeeping or managing your Housekeeping? N  Some recent data might be hidden    Fall Risk Assessment Fall Risk  08/28/2018 06/27/2018 11/04/2017 08/22/2017 05/13/2017  Falls in the past year? 0 0 No No No  Follow up - Falls evaluation completed - - -     Depression Screen PHQ 2/9 Scores 08/28/2018 11/04/2017 08/22/2017 05/13/2017  PHQ - 2 Score 0 0 0 0  PHQ- 9  Score - - - -    6CIT Screen 08/03/2016  What Year? 0 points  What month? 0 points  What time? 0 points  Count back from 20 0 points  Months in reverse 0 points  Repeat phrase 6 points  Total Score 6      Assessment & Plan:     Annual Wellness Visit  Reviewed patient's Family Medical History Reviewed and updated list of patient's medical providers Assessment of cognitive impairment was done Assessed patient's functional ability Established a written schedule for health screening [place] Completed and Reviewed  Exercise Activities and Dietary recommendations Goals    . DIET - INCREASE WATER INTAKE     Recommend increasing water intake to 4-6 glasses a day.        Immunization History  Administered Date(s) Administered  . Influenza Split 04/01/2007, 02/20/2010, 03/25/2011, 03/30/2012  . Influenza, High Dose Seasonal PF 02/19/2014, 03/05/2015, 03/18/2016, 02/21/2018  . Influenza,inj,Quad PF,6+ Mos 02/21/2013, 03/09/2017  . Pneumococcal Conjugate-13 04/24/2013  . Pneumococcal Polysaccharide-23 05/16/2014  . Tdap 01/19/2005  . Zoster 03/30/2012  . Zoster Recombinat (Shingrix) 12/07/2017, 04/01/2018    Health Maintenance  Topic Date Due  . Samul Dada  01/20/2015  . INFLUENZA VACCINE  12/16/2018  . COLONOSCOPY  12/16/2020  . Hepatitis C Screening  Completed  . PNA vac Low Risk Adult  Completed     Discussed health benefits of physical activity, and encouraged him to engage in regular exercise appropriate for his age and condition.  1. Annual physical exam   2. Intermittent lightheadedness Discussed hydration and following BP at home. - EKG 12-Lead  3. Essential (primary) hypertension  - TSH  4. Hypercholesteremia  - Lipid panel  5. Cut, accidental  - Td : Tetanus/diphtheria >7yo Preservative  free 6.Lymphoma  I, Rachelle L.  Presley, CMA, am acting as a Education administrator for [employer]. Rosanna Randy, [place]   Wilhemena Durie, MD   [facility]

## 2018-09-19 LAB — LIPID PANEL
Chol/HDL Ratio: 6.2 ratio — ABNORMAL HIGH (ref 0.0–5.0)
Cholesterol, Total: 222 mg/dL — ABNORMAL HIGH (ref 100–199)
HDL: 36 mg/dL — ABNORMAL LOW (ref >39)
LDL Calculated: 159 mg/dL — ABNORMAL HIGH (ref 0–99)
Triglycerides: 136 mg/dL (ref 0–149)
VLDL Cholesterol Cal: 27 mg/dL (ref 5–40)

## 2018-09-19 LAB — TSH: TSH: 2.24 u[IU]/mL (ref 0.450–4.500)

## 2018-11-29 ENCOUNTER — Other Ambulatory Visit: Payer: Self-pay | Admitting: Family Medicine

## 2018-11-29 DIAGNOSIS — I10 Essential (primary) hypertension: Secondary | ICD-10-CM

## 2018-12-06 ENCOUNTER — Ambulatory Visit: Payer: Medicare Other

## 2018-12-11 ENCOUNTER — Ambulatory Visit: Payer: Medicare Other

## 2018-12-12 ENCOUNTER — Inpatient Hospital Stay: Payer: Medicare Other

## 2018-12-12 ENCOUNTER — Inpatient Hospital Stay: Payer: Medicare Other | Admitting: Internal Medicine

## 2018-12-19 ENCOUNTER — Telehealth: Payer: Self-pay | Admitting: Internal Medicine

## 2018-12-19 NOTE — Telephone Encounter (Signed)
Spoke to patient regarding insurance declined for PET scan; will reevaluate patient in the clinic next week.  And then decide on imaging.  Patient agreement.

## 2018-12-26 ENCOUNTER — Other Ambulatory Visit: Payer: Self-pay

## 2018-12-27 ENCOUNTER — Inpatient Hospital Stay (HOSPITAL_BASED_OUTPATIENT_CLINIC_OR_DEPARTMENT_OTHER): Payer: Medicare Other | Admitting: Internal Medicine

## 2018-12-27 ENCOUNTER — Inpatient Hospital Stay: Payer: Medicare Other | Attending: Internal Medicine

## 2018-12-27 ENCOUNTER — Other Ambulatory Visit: Payer: Self-pay

## 2018-12-27 ENCOUNTER — Encounter: Payer: Self-pay | Admitting: Internal Medicine

## 2018-12-27 DIAGNOSIS — Z79899 Other long term (current) drug therapy: Secondary | ICD-10-CM | POA: Diagnosis not present

## 2018-12-27 DIAGNOSIS — E785 Hyperlipidemia, unspecified: Secondary | ICD-10-CM | POA: Diagnosis not present

## 2018-12-27 DIAGNOSIS — Z7982 Long term (current) use of aspirin: Secondary | ICD-10-CM | POA: Diagnosis not present

## 2018-12-27 DIAGNOSIS — C8338 Diffuse large B-cell lymphoma, lymph nodes of multiple sites: Secondary | ICD-10-CM | POA: Insufficient documentation

## 2018-12-27 DIAGNOSIS — C8334 Diffuse large B-cell lymphoma, lymph nodes of axilla and upper limb: Secondary | ICD-10-CM | POA: Diagnosis not present

## 2018-12-27 DIAGNOSIS — I10 Essential (primary) hypertension: Secondary | ICD-10-CM | POA: Diagnosis not present

## 2018-12-27 DIAGNOSIS — Z791 Long term (current) use of non-steroidal anti-inflammatories (NSAID): Secondary | ICD-10-CM | POA: Diagnosis not present

## 2018-12-27 DIAGNOSIS — N181 Chronic kidney disease, stage 1: Secondary | ICD-10-CM | POA: Insufficient documentation

## 2018-12-27 DIAGNOSIS — G629 Polyneuropathy, unspecified: Secondary | ICD-10-CM | POA: Insufficient documentation

## 2018-12-27 LAB — COMPREHENSIVE METABOLIC PANEL
ALT: 16 U/L (ref 0–44)
AST: 17 U/L (ref 15–41)
Albumin: 4 g/dL (ref 3.5–5.0)
Alkaline Phosphatase: 58 U/L (ref 38–126)
Anion gap: 6 (ref 5–15)
BUN: 23 mg/dL (ref 8–23)
CO2: 22 mmol/L (ref 22–32)
Calcium: 9 mg/dL (ref 8.9–10.3)
Chloride: 113 mmol/L — ABNORMAL HIGH (ref 98–111)
Creatinine, Ser: 1.29 mg/dL — ABNORMAL HIGH (ref 0.61–1.24)
GFR calc Af Amer: >60 mL/min (ref >60)
GFR calc non Af Amer: 56 mL/min — ABNORMAL LOW (ref >60)
Glucose, Bld: 104 mg/dL — ABNORMAL HIGH (ref 70–99)
Potassium: 4 mmol/L (ref 3.5–5.1)
Sodium: 141 mmol/L (ref 135–145)
Total Bilirubin: 0.8 mg/dL (ref 0.3–1.2)
Total Protein: 6.7 g/dL (ref 6.5–8.1)

## 2018-12-27 LAB — CBC WITH DIFFERENTIAL/PLATELET
Abs Immature Granulocytes: 0.01 10*3/uL (ref 0.00–0.07)
Basophils Absolute: 0.1 10*3/uL (ref 0.0–0.1)
Basophils Relative: 1 %
Eosinophils Absolute: 0.2 10*3/uL (ref 0.0–0.5)
Eosinophils Relative: 4 %
HCT: 41.1 % (ref 39.0–52.0)
Hemoglobin: 14.2 g/dL (ref 13.0–17.0)
Immature Granulocytes: 0 %
Lymphocytes Relative: 16 %
Lymphs Abs: 0.9 10*3/uL (ref 0.7–4.0)
MCH: 31.4 pg (ref 26.0–34.0)
MCHC: 34.5 g/dL (ref 30.0–36.0)
MCV: 90.9 fL (ref 80.0–100.0)
Monocytes Absolute: 0.5 10*3/uL (ref 0.1–1.0)
Monocytes Relative: 8 %
Neutro Abs: 3.7 10*3/uL (ref 1.7–7.7)
Neutrophils Relative %: 71 %
Platelets: 181 10*3/uL (ref 150–400)
RBC: 4.52 MIL/uL (ref 4.22–5.81)
RDW: 13.1 % (ref 11.5–15.5)
WBC: 5.3 10*3/uL (ref 4.0–10.5)
nRBC: 0 % (ref 0.0–0.2)

## 2018-12-27 LAB — LACTATE DEHYDROGENASE: LDH: 108 U/L (ref 98–192)

## 2018-12-27 NOTE — Progress Notes (Signed)
[place] OFFICE PROGRESS NOTE  Patient Care Team: Jerrol Banana., MD as PCP - General (Family Medicine) Cammie Sickle, MD as Consulting Physician (Medical Oncology) Noreene Filbert, MD as Referring Physician (Radiation Oncology) Anell Barr, OD as Consulting Physician (Optometry)  Cancer Staging No matching staging information was found for the patient.   Oncology History Overview Note  # AUG 6th 2018- DLBCL STAGE I [LEFT axilla- Core Bx- Dr.Sankar; CD-20, bcl-2;bcl-6 positive]; Ki- 67 >90%. MUM-1 NEG. GCB subtype; Myc- POS; FISH studies-NEGATIVE for gene RE-ARRANGEMENTS [positive for extra-copies of bcl-6/ myc/IgH/bcl-2]PET scan- Right Ax LN [4.5cm; SUV 35]  # R-CHOP [aug 20th 2018] x3 cycles; RT [starting 10/25; Nov 266 2018]; DEc 2018- CR ; except 8 mm left ax LN- no uptake.   # AUG 13th 2018. MUGA scan-56%.   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  male pleasant patient above history of diffuse large B-cell lymphoma is here for follow-up.  Patient denies any weight loss.  Denies any nausea vomiting.  No lumps or bumps.  Continues to have mild numbness in the fingertips.  He denies any weight loss.  Denies any night sweats.  Review of Systems  Constitutional: Positive for malaise/fatigue. Negative for chills, diaphoresis, fever and weight loss.  HENT: Negative for nosebleeds and sore throat.   Eyes: Negative for double vision.  Respiratory: Negative for cough, hemoptysis, sputum production, shortness of breath and wheezing.   Cardiovascular: Negative for chest pain, palpitations, orthopnea and leg swelling.  Gastrointestinal: Negative for abdominal pain, blood in stool, constipation, diarrhea, heartburn, melena, nausea and vomiting.  Genitourinary: Negative for dysuria,  frequency and urgency.  Musculoskeletal: Negative for back pain and joint pain.  Skin: Negative.  Negative for itching and rash.  Neurological: Positive for tingling (Mild tingling and numbness of the fingertips). Negative for dizziness, focal weakness, weakness and headaches.  Endo/Heme/Allergies: Does not bruise/bleed easily.  Psychiatric/Behavioral: Negative for depression. The patient is not nervous/anxious and does not have insomnia.       PAST MEDICAL HISTORY :  Past Medical History:  Diagnosis Date  . Allergy   . Arthritis   . Cancer ([place])    left axillary  . Cataract   . GERD (gastroesophageal reflux disease)   . Hyperlipidemia   . Hypertension     PAST SURGICAL HISTORY :   Past Surgical History:  Procedure Laterality Date  . CATARACT EXTRACTION Bilateral   . MOLE REMOVAL    . NECK MASS EXCISION    . PORTACATH PLACEMENT N/A 12/29/2016   Procedure: INSERTION PORT-A-CATH;  Surgeon: Christene Lye, MD;  Location: [facility] ORS;  Service: General;  Laterality: N/A;  . TONSILLECTOMY      FAMILY HISTORY :   Family History  Problem Relation Age of Onset  . Heart disease Mother   . Rheumatic fever Mother   . CAD Mother   . Heart attack Father   . COPD Sister     SOCIAL HISTORY:   Social History   Tobacco Use  . Smoking status: Never Smoker  . Smokeless tobacco: Never Used  Substance Use Topics  . Alcohol use: Yes    Alcohol/week: 5.0 standard drinks    Types: 5 Cans of beer per week  . Drug use: No  ALLERGIES:  is allergic to lovastatin; pravastatin; pravastatin sodium; simvastatin; statins; and sulfa antibiotics.  MEDICATIONS:  Current Outpatient Medications  Medication Sig Dispense Refill  . amLODipine (NORVASC) 5 MG tablet TAKE ONE TABLET BY MOUTH DAILY 90 tablet 1  . aspirin EC 81 MG tablet Take 81 mg by mouth daily.    . cetirizine (ZYRTEC) 10 MG tablet Take 10 mg by mouth daily.    . cholecalciferol (VITAMIN D) 1000 UNITS tablet Take 1,000  Units by mouth daily.     Marland Kitchen docusate sodium (COLACE) 100 MG capsule Take 200 mg by mouth daily.     Marland Kitchen ibuprofen (ADVIL,MOTRIN) 200 MG tablet Take 800 mg by mouth every 8 (eight) hours as needed (for pain).    Marland Kitchen lisinopril (ZESTRIL) 40 MG tablet TAKE 1 TABLET (40 MG TOTAL) BY MOUTH DAILY. 90 tablet 1  . omeprazole (PRILOSEC) 20 MG capsule TAKE ONE CAPSULE BY MOUTH DAILY 90 capsule 3  . vitamin C (ASCORBIC ACID) 500 MG tablet Take 500 mg by mouth daily.    Marland Kitchen lidocaine-prilocaine (EMLA) cream Apply 1 application topically as needed. To port a cath (Patient not taking: Reported on 06/27/2018) 30 g 3  . phenylephrine-shark liver oil-mineral oil-petrolatum (PREPARATION H) 0.25-14-74.9 % rectal ointment Place 1 application rectally 2 (two) times daily as needed for hemorrhoids.     No current facility-administered medications for this visit.     PHYSICAL EXAMINATION: ECOG PERFORMANCE STATUS: 0 - Asymptomatic  BP 135/87   Pulse 66   Temp (!) 97.2 F (36.2 C) (Tympanic)   Wt 219 lb 12.8 oz (99.7 kg)   BMI 35.48 kg/m   Filed Weights   12/27/18 1042  Weight: 219 lb 12.8 oz (99.7 kg)    Physical Exam  Constitutional: He is oriented to person, place, and time and well-developed, well-nourished, and in no distress.  HENT:  Head: Normocephalic and atraumatic.  Mouth/Throat: Oropharynx is clear and moist. No oropharyngeal exudate.  Eyes: Pupils are equal, round, and reactive to light.  Neck: Normal range of motion. Neck supple.  Cardiovascular: Normal rate and regular rhythm.  Pulmonary/Chest: No respiratory distress. He has no wheezes.  Approximately 1 cm lymph node felt in the left underarm/not getting any bigger.  Abdominal: Soft. Bowel sounds are normal. He exhibits no distension and no mass. There is no abdominal tenderness. There is no rebound and no guarding.  Musculoskeletal: Normal range of motion.        General: No tenderness or edema.  Neurological: He is alert and oriented to  person, place, and time.  Skin: Skin is warm.  Psychiatric: Affect normal.       LABORATORY DATA:  I have reviewed the data as listed    Component Value Date/Time   NA 141 12/27/2018 1023   NA 142 08/23/2017 1128   K 4.0 12/27/2018 1023   CL 113 (H) 12/27/2018 1023   CO2 22 12/27/2018 1023   GLUCOSE 104 (H) 12/27/2018 1023   BUN 23 12/27/2018 1023   BUN 17 08/23/2017 1128   CREATININE 1.29 (H) 12/27/2018 1023   CALCIUM 9.0 12/27/2018 1023   PROT 6.7 12/27/2018 1023   PROT 6.6 08/23/2017 1128   ALBUMIN 4.0 12/27/2018 1023   ALBUMIN 4.4 08/23/2017 1128   AST 17 12/27/2018 1023   ALT 16 12/27/2018 1023   ALKPHOS 58 12/27/2018 1023   BILITOT 0.8 12/27/2018 1023   BILITOT 0.6 08/23/2017 1128   GFRNONAA 56 (L) 12/27/2018 1023   GFRAA >60  12/27/2018 1023    No results found for: SPEP, UPEP  Lab Results  Component Value Date   WBC 5.3 12/27/2018   NEUTROABS 3.7 12/27/2018   HGB 14.2 12/27/2018   HCT 41.1 12/27/2018   MCV 90.9 12/27/2018   PLT 181 12/27/2018      Chemistry      Component Value Date/Time   NA 141 12/27/2018 1023   NA 142 08/23/2017 1128   K 4.0 12/27/2018 1023   CL 113 (H) 12/27/2018 1023   CO2 22 12/27/2018 1023   BUN 23 12/27/2018 1023   BUN 17 08/23/2017 1128   CREATININE 1.29 (H) 12/27/2018 1023   GLU 91 11/06/2013      Component Value Date/Time   CALCIUM 9.0 12/27/2018 1023   ALKPHOS 58 12/27/2018 1023   AST 17 12/27/2018 1023   ALT 16 12/27/2018 1023   BILITOT 0.8 12/27/2018 1023   BILITOT 0.6 08/23/2017 1128       RADIOGRAPHIC STUDIES: I have personally reviewed the radiological images as listed and agreed with the findings in the report. No results found.   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  December 2019-PET scan shows no significant  uptake or any concerns for pathologic lymphadenopathy.  Clinically stable.  #  No evidence of progression or recurrence [stable left axillary 1 cm lymph node];continue surveillance without imaging. appt with Dr.Crystal in dec; follow up with me in 6 months.   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  # CKD stage I creatinine 1.2-1.4-; stable.  # DISPOSITION: # follow up in 6 months-MD/cbc/cmp/ldh/Dr.B     Orders Placed This Encounter  Procedures  . CBC with Differential    Standing Status:   Future    Standing Expiration Date:   12/27/2019  . Comprehensive metabolic panel    Standing Status:   Future    Standing Expiration Date:   12/27/2019  . Lactate dehydrogenase    Standing Status:   Future    Standing Expiration Date:   12/27/2019   All questions were answered. The patient knows to call the clinic with any problems, questions or concerns.      Cammie Sickle, MD 12/27/2018 12:36 PM

## 2018-12-27 NOTE — Assessment & Plan Note (Addendum)
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  December 2019-PET scan shows no significant uptake or any concerns for pathologic lymphadenopathy.  Clinically stable.  #  No evidence of progression or recurrence [stable left axillary 1 cm lymph node];continue surveillance without imaging. appt with Dr.Crystal in dec; follow up with me in 6 months.   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  # CKD stage I creatinine 1.2-1.4-; stable.  # DISPOSITION: # follow up in 6 months-MD/cbc/cmp/ldh/Dr.B

## 2019-02-14 ENCOUNTER — Ambulatory Visit (INDEPENDENT_AMBULATORY_CARE_PROVIDER_SITE_OTHER): Payer: Medicare Other

## 2019-02-14 ENCOUNTER — Other Ambulatory Visit: Payer: Self-pay

## 2019-02-14 DIAGNOSIS — Z23 Encounter for immunization: Secondary | ICD-10-CM

## 2019-05-28 ENCOUNTER — Other Ambulatory Visit: Payer: Self-pay | Admitting: Family Medicine

## 2019-05-28 DIAGNOSIS — I10 Essential (primary) hypertension: Secondary | ICD-10-CM

## 2019-05-28 NOTE — Telephone Encounter (Signed)
Requested Prescriptions  Pending Prescriptions Disp Refills  . amLODipine (NORVASC) 5 MG tablet [Pharmacy Med Name: amLODIPine BESYLATE 5 MG TAB] 90 tablet 0    Sig: TAKE ONE TABLET BY MOUTH DAILY     Cardiovascular:  Calcium Channel Blockers Failed - 05/28/2019 10:42 AM      Failed - Valid encounter within last 6 months    Recent Outpatient Visits          8 months ago Annual physical exam   [facility] Jerrol Banana., MD   1 year ago Essential (primary) hypertension   [facility] Jerrol Banana., MD   1 year ago Annual physical exam   [facility] Jerrol Banana., MD   2 years ago Axillary mass, left   [facility] [place], [place]   2 years ago Visit for annual health examination   [facility] Jerrol Banana., MD      Future Appointments            In 3 months [facility]            Passed - Last BP in normal range    BP Readings from Last 1 Encounters:  12/27/18 135/87         . lisinopril (ZESTRIL) 40 MG tablet [Pharmacy Med Name: LISINOPRIL 40 MG TABLET] 90 tablet 0    Sig: TAKE ONE TABLET BY MOUTH DAILY     Cardiovascular:  ACE Inhibitors Failed - 05/28/2019 10:42 AM      Failed - Cr in normal range and within 180 days    Creatinine, Ser  Date Value Ref Range Status  12/27/2018 1.29 (H) 0.61 - 1.24 mg/dL Final         Failed - Valid encounter within last 6 months    Recent Outpatient Visits          8 months ago Annual physical exam   [facility] Jerrol Banana., MD   1 year ago Essential (primary) hypertension   [facility] Jerrol Banana., MD   1 year ago Annual physical exam   [facility] Jerrol Banana., MD   2 years ago Axillary mass, left   [facility] [place], [place], [place]   2 years ago Visit for annual health examination   [facility] Jerrol Banana., MD      Future Appointments            In 3 months [facility], [place] - K in normal range and within 180 days    Potassium  Date Value Ref Range Status  12/27/2018 4.0 3.5 - 5.1 mmol/L Final         Passed - Patient is not pregnant      Passed - Last BP in normal range    BP Readings from Last 1 Encounters:  12/27/18 135/87

## 2019-05-29 ENCOUNTER — Other Ambulatory Visit: Payer: Self-pay

## 2019-05-30 ENCOUNTER — Encounter: Payer: Self-pay | Admitting: Radiation Oncology

## 2019-05-30 ENCOUNTER — Ambulatory Visit
Admission: RE | Admit: 2019-05-30 | Discharge: 2019-05-30 | Disposition: A | Discharge status: Home / Self Care | Payer: Medicare PPO | Source: Ambulatory Visit | Attending: Radiation Oncology | Admitting: Radiation Oncology

## 2019-05-30 ENCOUNTER — Other Ambulatory Visit: Payer: Self-pay

## 2019-05-30 VITALS — BP 136/86 | HR 90 | Temp 97.6°F | Resp 18 | Wt 223.3 lb

## 2019-05-30 DIAGNOSIS — C8334 Diffuse large B-cell lymphoma, lymph nodes of axilla and upper limb: Secondary | ICD-10-CM

## 2019-05-30 DIAGNOSIS — Z8572 Personal history of non-Hodgkin lymphomas: Secondary | ICD-10-CM | POA: Insufficient documentation

## 2019-05-30 DIAGNOSIS — Z923 Personal history of irradiation: Secondary | ICD-10-CM | POA: Insufficient documentation

## 2019-05-30 NOTE — Progress Notes (Signed)
Radiation Oncology Follow up Note  Name: Henry [name]   Date:   05/30/2019 MRN:  [id number] DOB: [birth year]    This 74 y.o. male presents to the clinic today for 2-year follow-up status post adjuvant radiation therapy to his left axilla for involved field stage I diffuse large B-cell lymphoma.  REFERRING PROVIDER: Jerrol Banana.,*  HPI: Patient is a 74 year old male now out 2 years having completed involved field radiation therapy to his left axilla for stage I diffuse large B-cell lymphoma. He is seen today in routine follow-up is doing well. He specifically [place] fever chills night sweats any other adenopathy in the head and neck region or elsewhere in his body.. Medical oncology is try obtain the PET CT scan although that has not been approved. His last PET/CT which I have again reviewed was back in December 2019 showing no evidence of hypermetabolic activity.  COMPLICATIONS OF TREATMENT: none  FOLLOW UP COMPLIANCE: keeps appointments   PHYSICAL EXAM:  BP 136/86 (BP Location: Left Arm, Patient Position: Sitting)   Pulse 90   Temp 97.6 F (36.4 C) (Tympanic)   Resp 18   Wt 223 lb 4.8 oz (101.3 kg)   BMI 36.04 kg/m  Well-developed well-nourished patient in NAD. HEENT reveals PERLA, EOMI, discs not visualized.  Oral cavity is clear. No oral mucosal lesions are identified. Neck is clear without evidence of cervical or supraclavicular adenopathy. Lungs are clear to A&P. Cardiac examination is essentially unremarkable with regular rate and rhythm without murmur rub or thrill. Abdomen is benign with no organomegaly or masses noted. Motor sensory and DTR levels are equal and symmetric in the upper and lower extremities. Cranial nerves II through XII are grossly intact. Proprioception is intact. No peripheral adenopathy or edema is identified. No motor or sensory levels are noted. Crude visual fields are within normal range. No evidence of adenopathy is noted.  RADIOLOGY RESULTS:  PET/CT from 2019 December reviewed compatible with above-stated findings showing no evidence of disease.  PLAN: Present time patient continues to do well with no evidence of disease. I will turn follow-up care over to medical oncology. I would be happy to reevaluate the patient anytime should further follow-up care be indicated. Patient knows to call with any concerns.  I would like to take this opportunity to thank you for allowing me to participate in the care of your patient.Noreene Filbert, MD

## 2019-06-26 ENCOUNTER — Other Ambulatory Visit: Payer: Self-pay

## 2019-06-27 ENCOUNTER — Inpatient Hospital Stay: Payer: Medicare PPO | Attending: Internal Medicine

## 2019-06-27 ENCOUNTER — Inpatient Hospital Stay: Payer: Medicare PPO | Admitting: Internal Medicine

## 2019-06-27 ENCOUNTER — Other Ambulatory Visit: Payer: Self-pay

## 2019-06-27 DIAGNOSIS — N183 Chronic kidney disease, stage 3 unspecified: Secondary | ICD-10-CM | POA: Diagnosis not present

## 2019-06-27 DIAGNOSIS — I129 Hypertensive chronic kidney disease with stage 1 through stage 4 chronic kidney disease, or unspecified chronic kidney disease: Secondary | ICD-10-CM | POA: Insufficient documentation

## 2019-06-27 DIAGNOSIS — C8334 Diffuse large B-cell lymphoma, lymph nodes of axilla and upper limb: Secondary | ICD-10-CM | POA: Diagnosis not present

## 2019-06-27 DIAGNOSIS — Z8572 Personal history of non-Hodgkin lymphomas: Secondary | ICD-10-CM | POA: Diagnosis not present

## 2019-06-27 LAB — COMPREHENSIVE METABOLIC PANEL
ALT: 13 U/L (ref 0–44)
AST: 15 U/L (ref 15–41)
Albumin: 3.9 g/dL (ref 3.5–5.0)
Alkaline Phosphatase: 62 U/L (ref 38–126)
Anion gap: 7 (ref 5–15)
BUN: 19 mg/dL (ref 8–23)
CO2: 25 mmol/L (ref 22–32)
Calcium: 9.2 mg/dL (ref 8.9–10.3)
Chloride: 107 mmol/L (ref 98–111)
Creatinine, Ser: 1.38 mg/dL — ABNORMAL HIGH (ref 0.61–1.24)
GFR calc Af Amer: 59 mL/min — ABNORMAL LOW (ref >60)
GFR calc non Af Amer: 51 mL/min — ABNORMAL LOW (ref >60)
Glucose, Bld: 100 mg/dL — ABNORMAL HIGH (ref 70–99)
Potassium: 4.4 mmol/L (ref 3.5–5.1)
Sodium: 139 mmol/L (ref 135–145)
Total Bilirubin: 0.7 mg/dL (ref 0.3–1.2)
Total Protein: 7 g/dL (ref 6.5–8.1)

## 2019-06-27 LAB — CBC WITH DIFFERENTIAL/PLATELET
Abs Immature Granulocytes: 0.01 10*3/uL (ref 0.00–0.07)
Basophils Absolute: 0.1 10*3/uL (ref 0.0–0.1)
Basophils Relative: 1 %
Eosinophils Absolute: 0.2 10*3/uL (ref 0.0–0.5)
Eosinophils Relative: 4 %
HCT: 42.8 % (ref 39.0–52.0)
Hemoglobin: 14.2 g/dL (ref 13.0–17.0)
Immature Granulocytes: 0 %
Lymphocytes Relative: 17 %
Lymphs Abs: 1 10*3/uL (ref 0.7–4.0)
MCH: 30.8 pg (ref 26.0–34.0)
MCHC: 33.2 g/dL (ref 30.0–36.0)
MCV: 92.8 fL (ref 80.0–100.0)
Monocytes Absolute: 0.5 10*3/uL (ref 0.1–1.0)
Monocytes Relative: 8 %
Neutro Abs: 3.9 10*3/uL (ref 1.7–7.7)
Neutrophils Relative %: 70 %
Platelets: 196 10*3/uL (ref 150–400)
RBC: 4.61 MIL/uL (ref 4.22–5.81)
RDW: 12.5 % (ref 11.5–15.5)
WBC: 5.6 10*3/uL (ref 4.0–10.5)
nRBC: 0 % (ref 0.0–0.2)

## 2019-06-27 LAB — LACTATE DEHYDROGENASE: LDH: 107 U/L (ref 98–192)

## 2019-06-27 NOTE — Assessment & Plan Note (Addendum)
Diffuse large B-cell lymphoma- stage I. s/p  R-CHOP s/p cycle #3; s/p IFRT;  December 2019-PET scan shows no significant uptake or any concerns for pathologic lymphadenopathy.  Clinically stable.  #  No evidence of progression or recurrence [stable left axillary 1 cm lymph node]-stable  # CKD -stage III   # # I discussed regarding Covid-19 precautions.  I reviewed the vaccine effectiveness and potential side effects in detail.  Also discussed long-term effectiveness and safety profile are unclear at this time.  I discussed December, 2020 ASCO position statement-that all patients are recommended COVID-19 vaccinations [when available]-as long as they do not have allergy to components of the vaccine.  However, I think the benefits of the vaccination outweigh the potential risks.  # DISPOSITION: # follow up in 6 months-MD/cbc/cmp/ldh/Dr.B

## 2019-06-27 NOTE — Progress Notes (Signed)
[place] OFFICE PROGRESS NOTE  Patient Care Team: Jerrol Banana., MD as PCP - General (Family Medicine) Cammie Sickle, MD as Consulting Physician (Medical Oncology) Noreene Filbert, MD as Referring Physician (Radiation Oncology) Anell Barr, OD as Consulting Physician (Optometry)  Cancer Staging No matching staging information was found for the patient.   Oncology History Overview Note  # AUG 6th 2018- DLBCL STAGE I [LEFT axilla- Core Bx- Dr.Sankar; CD-20, bcl-2;bcl-6 positive]; Ki- 67 >90%. MUM-1 NEG. GCB subtype; Myc- POS; FISH studies-NEGATIVE for gene RE-ARRANGEMENTS [positive for extra-copies of bcl-6/ myc/IgH/bcl-2]PET scan- Right Ax LN [4.5cm; SUV 35]  # R-CHOP [aug 20th 2018] x3 cycles; RT [starting 10/25; Nov 266 2018]; DEc 2018- CR ; except 8 mm left ax LN- no uptake.   # AUG 13th 2018. MUGA scan-56%.   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  male pleasant patient above history of diffuse large B-cell lymphoma is here for follow-up.  Denies any nausea vomiting denies any new lumps or bumps.  No night sweats.   Review of Systems  Constitutional: Negative for chills, diaphoresis, fever and weight loss.  HENT: Negative for nosebleeds and sore throat.   Eyes: Negative for double vision.  Respiratory: Negative for cough, hemoptysis, sputum production, shortness of breath and wheezing.   Cardiovascular: Negative for chest pain, palpitations, orthopnea and leg swelling.  Gastrointestinal: Negative for abdominal pain, blood in stool, constipation, diarrhea, heartburn, melena, nausea and vomiting.  Genitourinary: Negative for dysuria, frequency and urgency.  Musculoskeletal: Negative for back pain and joint pain.  Skin: Negative.  Negative for itching and rash.   Neurological: Negative for dizziness, focal weakness, weakness and headaches.  Endo/Heme/Allergies: Does not bruise/bleed easily.  Psychiatric/Behavioral: Negative for depression. The patient is not nervous/anxious and does not have insomnia.       PAST MEDICAL HISTORY :  Past Medical History:  Diagnosis Date  . Allergy   . Arthritis   . Cancer ([place])    left axillary  . Cataract   . GERD (gastroesophageal reflux disease)   . Hyperlipidemia   . Hypertension     PAST SURGICAL HISTORY :   Past Surgical History:  Procedure Laterality Date  . CATARACT EXTRACTION Bilateral   . MOLE REMOVAL    . NECK MASS EXCISION    . PORTACATH PLACEMENT N/A 12/29/2016   Procedure: INSERTION PORT-A-CATH;  Surgeon: Christene Lye, MD;  Location: [facility] ORS;  Service: General;  Laterality: N/A;  . TONSILLECTOMY      FAMILY HISTORY :   Family History  Problem Relation Age of Onset  . Heart disease Mother   . Rheumatic fever Mother   . CAD Mother   . Heart attack Father   . COPD Sister     SOCIAL HISTORY:   Social History   Tobacco Use  . Smoking status: Never Smoker  . Smokeless tobacco: Never Used  Substance Use Topics  . Alcohol use: Yes    Alcohol/week: 5.0 standard drinks    Types: 5 Cans of beer per week  . Drug use: No    ALLERGIES:  is allergic to lovastatin; pravastatin; pravastatin sodium; simvastatin; statins; and sulfa antibiotics.  MEDICATIONS:  Current Outpatient Medications  Medication Sig Dispense Refill  . amLODipine (NORVASC) 5  MG tablet TAKE ONE TABLET BY MOUTH DAILY 90 tablet 0  . aspirin EC 81 MG tablet Take 81 mg by mouth daily.    . cetirizine (ZYRTEC) 10 MG tablet Take 10 mg by mouth daily.    . cholecalciferol (VITAMIN D) 1000 UNITS tablet Take 1,000 Units by mouth daily.     Marland Kitchen docusate sodium (COLACE) 100 MG capsule Take 200 mg by mouth daily.     Marland Kitchen ibuprofen (ADVIL,MOTRIN) 200 MG tablet Take 800 mg by mouth every 8 (eight) hours as needed (for  pain).    Marland Kitchen lidocaine-prilocaine (EMLA) cream Apply 1 application topically as needed. To port a cath 30 g 3  . lisinopril (ZESTRIL) 40 MG tablet TAKE ONE TABLET BY MOUTH DAILY 90 tablet 0  . omeprazole (PRILOSEC) 20 MG capsule TAKE ONE CAPSULE BY MOUTH DAILY 90 capsule 3  . phenylephrine-shark liver oil-mineral oil-petrolatum (PREPARATION H) 0.25-14-74.9 % rectal ointment Place 1 application rectally 2 (two) times daily as needed for hemorrhoids.    . vitamin C (ASCORBIC ACID) 500 MG tablet Take 500 mg by mouth daily.     No current facility-administered medications for this visit.    PHYSICAL EXAMINATION: ECOG PERFORMANCE STATUS: 0 - Asymptomatic  BP 135/88 (BP Location: Left Arm, Patient Position: Sitting, Cuff Size: Normal)   Pulse 67   Temp (!) 94.8 F (34.9 C) (Tympanic)   Wt 222 lb (100.7 kg)   BMI 35.83 kg/m   Filed Weights   06/27/19 1031  Weight: 222 lb (100.7 kg)    Physical Exam  Constitutional: He is oriented to person, place, and time and well-developed, well-nourished, and in no distress.  HENT:  Head: Normocephalic and atraumatic.  Mouth/Throat: Oropharynx is clear and moist. No oropharyngeal exudate.  Eyes: Pupils are equal, round, and reactive to light.  Cardiovascular: Normal rate and regular rhythm.  Pulmonary/Chest: No respiratory distress. He has no wheezes.  Approximately 1 cm lymph node felt in the left underarm/not getting any bigger.  Abdominal: Soft. Bowel sounds are normal. He exhibits no distension and no mass. There is no abdominal tenderness. There is no rebound and no guarding.  Musculoskeletal:        General: No tenderness or edema. Normal range of motion.     Cervical back: Normal range of motion and neck supple.  Neurological: He is alert and oriented to person, place, and time.  Skin: Skin is warm.  Psychiatric: Affect normal.       LABORATORY DATA:  I have reviewed the data as listed    Component Value Date/Time   NA 139  06/27/2019 0937   NA 142 08/23/2017 1128   K 4.4 06/27/2019 0937   CL 107 06/27/2019 0937   CO2 25 06/27/2019 0937   GLUCOSE 100 (H) 06/27/2019 0937   BUN 19 06/27/2019 0937   BUN 17 08/23/2017 1128   CREATININE 1.38 (H) 06/27/2019 0937   CALCIUM 9.2 06/27/2019 0937   PROT 7.0 06/27/2019 0937   PROT 6.6 08/23/2017 1128   ALBUMIN 3.9 06/27/2019 0937   ALBUMIN 4.4 08/23/2017 1128   AST 15 06/27/2019 0937   ALT 13 06/27/2019 0937   ALKPHOS 62 06/27/2019 0937   BILITOT 0.7 06/27/2019 0937   BILITOT 0.6 08/23/2017 1128   GFRNONAA 51 (L) 06/27/2019 0937   GFRAA 59 (L) 06/27/2019 0937    No results found for: SPEP, UPEP  Lab Results  Component Value Date   WBC 5.6 06/27/2019   NEUTROABS 3.9 06/27/2019  HGB 14.2 06/27/2019   HCT 42.8 06/27/2019   MCV 92.8 06/27/2019   PLT 196 06/27/2019      Chemistry      Component Value Date/Time   NA 139 06/27/2019 0937   NA 142 08/23/2017 1128   K 4.4 06/27/2019 0937   CL 107 06/27/2019 0937   CO2 25 06/27/2019 0937   BUN 19 06/27/2019 0937   BUN 17 08/23/2017 1128   CREATININE 1.38 (H) 06/27/2019 0937   GLU 91 11/06/2013 0000      Component Value Date/Time   CALCIUM 9.2 06/27/2019 0937   ALKPHOS 62 06/27/2019 0937   AST 15 06/27/2019 0937   ALT 13 06/27/2019 0937   BILITOT 0.7 06/27/2019 0937   BILITOT 0.6 08/23/2017 1128       RADIOGRAPHIC STUDIES: I have personally reviewed the radiological images as listed and agreed with the findings in the report. No results found.   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 s/p  R-CHOP s/p cycle #3; s/p IFRT;  December 2019-PET scan shows no significant uptake or any concerns for pathologic lymphadenopathy.  Clinically stable.  #  No evidence of progression or recurrence [stable left axillary 1 cm lymph node]-stable  # CKD -stage III   # # I discussed regarding Covid-19 precautions.  I reviewed  the vaccine effectiveness and potential side effects in detail.  Also discussed long-term effectiveness and safety profile are unclear at this time.  I discussed December, 2020 ASCO position statement-that all patients are recommended COVID-19 vaccinations [when available]-as long as they do not have allergy to components of the vaccine.  However, I think the benefits of the vaccination outweigh the potential risks.  # DISPOSITION: # follow up in 6 months-MD/cbc/cmp/ldh/Dr.B     Orders Placed This Encounter  Procedures  . CBC with Differential    Standing Status:   Future    Standing Expiration Date:   06/26/2020  . Comprehensive metabolic panel    Standing Status:   Future    Standing Expiration Date:   06/26/2020  . Lactate dehydrogenase    Standing Status:   Future    Standing Expiration Date:   06/26/2020   All questions were answered. The patient knows to call the clinic with any problems, questions or concerns.      Cammie Sickle, MD 06/27/2019 12:21 PM

## 2019-06-27 NOTE — Patient Instructions (Signed)
Re: COVID-19 vaccination.  For more information/scheduling recommend call [place] health department- [phone], 8:30am-4:30pm.   

## 2019-08-26 ENCOUNTER — Other Ambulatory Visit: Payer: Self-pay | Admitting: Family Medicine

## 2019-08-26 DIAGNOSIS — I10 Essential (primary) hypertension: Secondary | ICD-10-CM

## 2019-08-26 NOTE — Telephone Encounter (Signed)
Requested Prescriptions  Pending Prescriptions Disp Refills  . lisinopril (ZESTRIL) 40 MG tablet [Pharmacy Med Name: LISINOPRIL 40 MG TABLET] 90 tablet 0    Sig: TAKE ONE TABLET BY MOUTH DAILY     Cardiovascular:  ACE Inhibitors Failed - 08/26/2019  1:26 PM      Failed - Cr in normal range and within 180 days    Creatinine, Ser  Date Value Ref Range Status  06/27/2019 1.38 (H) 0.61 - 1.24 mg/dL Final         Failed - Valid encounter within last 6 months    Recent Outpatient Visits          11 months ago Annual physical exam   [facility] Jerrol Banana., MD   1 year ago Essential (primary) hypertension   [facility] Jerrol Banana., MD   2 years ago Annual physical exam   [facility] Jerrol Banana., MD   2 years ago Axillary mass, left   [place], [place]   3 years ago Visit for annual health examination   [facility] Jerrol Banana., MD      Future Appointments            In 3 days [facility], [place] in normal range and within 180 days    Potassium  Date Value Ref Range Status  06/27/2019 4.4 3.5 - 5.1 mmol/L Final         Passed - Patient is not pregnant      Passed - Last BP in normal range    BP Readings from Last 1 Encounters:  06/27/19 135/88         . amLODipine (NORVASC) 5 MG tablet [Pharmacy Med Name: amLODIPine BESYLATE 5 MG TAB] 90 tablet 0    Sig: TAKE ONE TABLET BY MOUTH DAILY     Cardiovascular:  Calcium Channel Blockers Failed - 08/26/2019  1:26 PM      Failed - Valid encounter within last 6 months    Recent Outpatient Visits          11 months ago Annual physical exam   [facility] Jerrol Banana., MD   1 year ago Essential (primary) hypertension   [facility] Jerrol Banana., MD   2 years ago Annual physical exam   [facility]  Jerrol Banana., MD   2 years ago Axillary mass, left   [place], [place]   3 years ago Visit for annual health examination   [facility] Jerrol Banana., MD      Future Appointments            In 3 days [facility], [place] BP in normal range    BP Readings from Last 1 Encounters:  06/27/19 135/88         pt given 9 day refill.

## 2019-08-28 NOTE — Progress Notes (Signed)
Subjective:   Henry [name] is a 74 y.o. male who presents for Medicare Annual/Subsequent preventive examination.    This visit is being conducted through telemedicine due to the COVID-19 pandemic. This patient has given me verbal consent via doximity to conduct this visit, patient states they are participating from their home address. Some vital signs may be absent or patient reported.    Patient identification: identified by name, DOB, and current address  Review of Systems:  N/A  Cardiac Risk Factors include: advanced age (>28men, >31 women);male gender;hypertension     Objective:    Vitals: There were no vitals taken for this visit.  There is no height or weight on file to calculate BMI. Unable to obtain vitals due to visit being conducted via telephonically.   Advanced Directives 08/29/2019 05/30/2019 12/27/2018 08/28/2018 05/24/2018 05/16/2018 05/01/2018  Does Patient Have a Medical Advance Directive? Yes Yes Yes Yes Yes Yes Yes  Type of Paramedic of [place];Living will [place];Living will [place];Living will [place];Living will [place];Living will Living will;Healthcare Power of [place];Living will  Does patient want to make changes to medical advance directive? - - - - No - Patient declined No - Patient declined No - Patient declined  Copy of [place] in Chart? No - copy requested No - copy requested No - copy requested No - copy requested No - copy requested No - copy requested No - copy requested  Would patient like information on creating a medical advance directive? - - No - Patient declined - No - Patient declined - No - Patient declined    Tobacco Social History   Tobacco Use  Smoking Status Never Smoker  Smokeless Tobacco Never Used     Counseling given: Not Answered   Clinical Intake:  Pre-visit  preparation completed: Yes  Pain : No/denies pain     Nutritional Risks: None Diabetes: No  How often do you need to have someone help you when you read instructions, pamphlets, or other written materials from your doctor or pharmacy?: 1 - Never  Interpreter Needed?: No  Information entered by :: [facility], LPN  Past Medical History:  Diagnosis Date  . Allergy   . Arthritis   . 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 Cataract   . GERD (gastroesophageal reflux disease)   . Hyperlipidemia   . Hypertension    Past Surgical History:  Procedure Laterality Date  . CATARACT EXTRACTION Bilateral   . MOLE REMOVAL    . NECK MASS EXCISION    . PORTACATH PLACEMENT N/A 12/29/2016   Procedure: INSERTION PORT-A-CATH;  Surgeon: Christene Lye, MD;  Location: [facility] ORS;  Service: General;  Laterality: N/A;  . TONSILLECTOMY     Family History  Problem Relation Age of Onset  . Heart disease Mother   . Rheumatic fever Mother   . CAD Mother   . Heart attack Father   . COPD Sister    Social History   Socioeconomic History  . Marital status: Married    Spouse name: Not on file  . Number of children: 2  . Years of education: Not on file  . Highest education level: Some college, no degree  Occupational History  . Occupation: retired  Tobacco Use  . Smoking status: Never Smoker  . Smokeless tobacco: Never Used  Substance and Sexual Activity  . Alcohol use: Yes  Alcohol/week: 8.0 standard drinks    Types: 8 Cans of beer per week    Comment: 2 beers four days a week  . Drug use: No  . Sexual activity: Not on file  Other Topics Concern  . Not on file  Social History Narrative  . Not on file   Social Determinants of Health   Financial Resource Strain: Low Risk   . Difficulty of Paying Living Expenses: Not hard at all  Food Insecurity: No Food Insecurity  . Worried About Charity fundraiser in the Last Year: Never true  . Ran Out of Food in the Last Year: Never true    Transportation Needs: No Transportation Needs  . Lack of Transportation (Medical): No  . Lack of Transportation (Non-Medical): No  Physical Activity: Inactive  . Days of Exercise per Week: 0 days  . Minutes of Exercise per Session: 0 min  Stress: No Stress Concern Present  . Feeling of Stress : Not at all  Social Connections: Not Isolated  . Frequency of Communication with Friends and Family: More than three times a week  . Frequency of Social Gatherings with Friends and Family: More than three times a week  . Attends Religious Services: More than 4 times per year  . Active Member of Clubs or Organizations: Yes  . Attends Archivist Meetings: 1 to 4 times per year  . Marital Status: Married    Outpatient Encounter Medications as of 08/29/2019  Medication Sig  . amLODipine (NORVASC) 5 MG tablet TAKE ONE TABLET BY MOUTH DAILY  . aspirin EC 81 MG tablet Take 81 mg by mouth daily.  . cetirizine (ZYRTEC) 10 MG tablet Take 10 mg by mouth daily.  . cholecalciferol (VITAMIN D) 1000 UNITS tablet Take 1,000 Units by mouth daily.   Marland Kitchen docusate sodium (COLACE) 100 MG capsule Take 100 mg by mouth daily.   Marland Kitchen ibuprofen (ADVIL,MOTRIN) 200 MG tablet Take 800 mg by mouth every 8 (eight) hours as needed (for pain).  Marland Kitchen lidocaine-prilocaine (EMLA) cream Apply 1 application topically as needed. To port a cath  . lisinopril (ZESTRIL) 40 MG tablet TAKE ONE TABLET BY MOUTH DAILY  . omeprazole (PRILOSEC) 20 MG capsule TAKE ONE CAPSULE BY MOUTH DAILY  . phenylephrine-shark liver oil-mineral oil-petrolatum (PREPARATION H) 0.25-14-74.9 % rectal ointment Place 1 application rectally 2 (two) times daily as needed for hemorrhoids.  . vitamin C (ASCORBIC ACID) 500 MG tablet Take 500 mg by mouth daily.  Marland Kitchen zinc gluconate 50 MG tablet Take 50 mg by mouth daily.   No facility-administered encounter medications on file as of 08/29/2019.    Activities of Daily Living In your present state of health, do you  have any difficulty performing the following activities: 08/29/2019  Hearing? N  Vision? N  Difficulty concentrating or making decisions? N  Walking or climbing stairs? N  Dressing or bathing? N  Doing errands, shopping? N  Preparing Food and eating ? N  Using the Toilet? N  In the past six months, have you accidently leaked urine? N  Do you have problems with loss of bowel control? N  Managing your Medications? N  Managing your Finances? N  Housekeeping or managing your Housekeeping? N  Some recent data might be hidden    Patient Care Team: Jerrol Banana., MD as PCP - General (Family Medicine) Cammie Sickle, MD as Consulting Physician (Medical Oncology) Noreene Filbert, MD as Referring Physician (Radiation Oncology) Anell Barr, OD as  Consulting Physician (Optometry)   Assessment:   This is a routine wellness examination for Rikki.  Exercise Activities and Dietary recommendations Current Exercise Habits: The patient does not participate in regular exercise at present, Exercise limited by: None identified  Goals    . DIET - INCREASE WATER INTAKE     Recommend increasing water intake to 4-6 glasses a day.        Fall Risk: Fall Risk  08/29/2019 08/28/2018 06/27/2018 11/04/2017 08/22/2017  Falls in the past year? 0 0 0 No No  Number falls in past yr: 0 - - - -  Injury with Fall? 0 - - - -  Follow up Falls prevention discussed - Falls evaluation completed - -    FALL RISK PREVENTION PERTAINING TO THE HOME:  Any stairs in or around the home? Yes  If so, are there any without handrails? Yes   Home free of loose throw rugs in walkways, pet beds, electrical cords, etc? Yes  Adequate lighting in your home to reduce risk of falls? Yes   ASSISTIVE DEVICES UTILIZED TO PREVENT FALLS:  Life alert? No  Use of a cane, walker or w/c? No  Grab bars in the bathroom? No  Shower chair or bench in shower? No  Elevated toilet seat or a handicapped toilet? Yes    TIMED UP AND GO:  Was the test performed? No .    Depression Screen PHQ 2/9 Scores 08/29/2019 08/28/2018 11/04/2017 08/22/2017  PHQ - 2 Score 0 0 0 0  PHQ- 9 Score - - - -    Cognitive Function: Declined today.     6CIT Screen 08/03/2016  What Year? 0 points  What month? 0 points  What time? 0 points  Count back from 20 0 points  Months in reverse 0 points  Repeat phrase 6 points  Total Score 6    Immunization History  Administered Date(s) Administered  . Fluad Quad(high Dose 65+) 02/14/2019  . Influenza Split 04/01/2007, 02/20/2010, 03/25/2011, 03/30/2012  . Influenza, High Dose Seasonal PF 02/19/2014, 03/05/2015, 03/18/2016, 02/21/2018  . Influenza,inj,Quad PF,6+ Mos 02/21/2013, 03/09/2017  . Moderna SARS-COVID-2 Vaccination 06/29/2019, 07/27/2019  . Pneumococcal Conjugate-13 04/24/2013  . Pneumococcal Polysaccharide-23 05/16/2014  . Td 09/18/2018  . Tdap 01/19/2005  . Zoster 03/30/2012  . Zoster Recombinat (Shingrix) 12/07/2017, 04/01/2018    Qualifies for Shingles Vaccine? Completed series  Tdap: Up to date  Flu Vaccine: Up to date  Pneumococcal Vaccine: Completed series  Screening Tests Health Maintenance  Topic Date Due  . INFLUENZA VACCINE  12/16/2019  . COLONOSCOPY  12/16/2020  . TETANUS/TDAP  09/17/2028  . Hepatitis C Screening  Completed  . PNA vac Low Risk Adult  Completed   Cancer Screenings:  Colorectal Screening: Completed 01/14/17. Repeat every 10 years.   Lung Cancer Screening: (Low Dose CT Chest recommended if Age 55-80 years, 30 pack-year currently smoking OR have quit w/in 15years.) does not qualify.   Additional Screening:  Hepatitis C Screening: Up to date  Vision Screening: Recommended annual ophthalmology exams for early detection of glaucoma and other disorders of the eye.  Dental Screening: Recommended annual dental exams for proper oral hygiene  Community Resource Referral:  CRR required this visit?  No        Plan:  I  have personally reviewed and addressed the Medicare Annual Wellness questionnaire and have noted the following in the patient's chart:  A. Medical and social history B. Use of alcohol, tobacco or illicit drugs  C. Current  medications and supplements D. Functional ability and status E.  Nutritional status F.  Physical activity G. Advance directives H. List of other physicians I.  Hospitalizations, surgeries, and ER visits in previous 12 months J.  [place] such as hearing and vision if needed, cognitive and depression L. Referrals and appointments   In addition, I have reviewed and discussed with patient certain preventive protocols, quality metrics, and best practice recommendations. A written personalized care plan for preventive services as well as general preventive health recommendations were provided to patient.   [place] Score, [place]  [id number] Nurse Health Advisor   Nurse Notes: None.

## 2019-08-29 ENCOUNTER — Other Ambulatory Visit: Payer: Self-pay

## 2019-08-29 ENCOUNTER — Ambulatory Visit (INDEPENDENT_AMBULATORY_CARE_PROVIDER_SITE_OTHER): Payer: Medicare PPO

## 2019-08-29 DIAGNOSIS — Z0000 Encounter for general adult medical examination without abnormal findings: Secondary | ICD-10-CM | POA: Diagnosis not present

## 2019-08-29 NOTE — Patient Instructions (Signed)
Henry [name] , Thank you for taking time to come for your Medicare Wellness Visit. I appreciate your ongoing commitment to your health goals. Please review the following plan we discussed and let me know if I can assist you in the future.   Screening recommendations/referrals: Colonoscopy: Up to date, due 12/2026 Recommended yearly ophthalmology/optometry visit for glaucoma screening and checkup Recommended yearly dental visit for hygiene and checkup  Vaccinations: Influenza vaccine: Up to date Pneumococcal vaccine: Completed series Tdap vaccine: Up to date Shingles vaccine: Completed series    Advanced directives: Please bring a copy of your POA (Power of Attorney) and/or Living Will to your next appointment.   Conditions/risks identified: Recommend increasing water intake to 6-8 8 oz glasses a day.  Next appointment: 09/03/20 @ 9:00 AM for an AWV. Declined scheduling a CPE with PCP at this time.   Preventive Care 72 Years and Older, Male Preventive care refers to lifestyle choices and visits with your health care provider that can promote health and wellness. What does preventive care include?  A yearly physical exam. This is also called an annual well check.  Dental exams once or twice a year.  Routine eye exams. Ask your health care provider how often you should have your eyes checked.  Personal lifestyle choices, including:  Daily care of your teeth and gums.  Regular physical activity.  Eating a healthy diet.  Avoiding tobacco and drug use.  Limiting alcohol use.  Practicing safe sex.  Taking low doses of aspirin every day.  Taking vitamin and mineral supplements as recommended by your health care provider. What happens during an annual well check? The services and screenings done by your health care provider during your annual well check will depend on your age, overall health, lifestyle risk factors, and family history of disease. Counseling  Your health care  provider may ask you questions about your:  Alcohol use.  Tobacco use.  Drug use.  Emotional well-being.  Home and relationship well-being.  Sexual activity.  Eating habits.  History of falls.  Memory and ability to understand (cognition).  Work and work Statistician. Screening  You may have the following tests or measurements:  Height, weight, and BMI.  Blood pressure.  Lipid and cholesterol levels. These may be checked every 5 years, or more frequently if you are over 34 years old.  Skin check.  Lung cancer screening. You may have this screening every year starting at age 21 if you have a 30-pack-year history of smoking and currently smoke or have quit within the past 15 years.  Fecal occult blood test (FOBT) of the stool. You may have this test every year starting at age 51.  Flexible sigmoidoscopy or colonoscopy. You may have a sigmoidoscopy every 5 years or a colonoscopy every 10 years starting at age 75.  Prostate cancer screening. Recommendations will vary depending on your family history and other risks.  Hepatitis C blood test.  Hepatitis B blood test.  Sexually transmitted disease (STD) testing.  Diabetes screening. This is done by checking your blood sugar (glucose) after you have not eaten for a while (fasting). You may have this done every 1-3 years.  Abdominal aortic aneurysm (AAA) screening. You may need this if you are a current or former smoker.  Osteoporosis. You may be screened starting at age 46 if you are at high risk. Talk with your health care provider about your test results, treatment options, and if necessary, the need for more tests. Vaccines  Your  health care provider may recommend certain vaccines, such as:  Influenza vaccine. This is recommended every year.  Tetanus, diphtheria, and acellular pertussis (Tdap, Td) vaccine. You may need a Td booster every 10 years.  Zoster vaccine. You may need this after age 53.  Pneumococcal  13-valent conjugate (PCV13) vaccine. One dose is recommended after age 19.  Pneumococcal polysaccharide (PPSV23) vaccine. One dose is recommended after age 65. Talk to your health care provider about which screenings and vaccines you need and how often you need them. This information is not intended to replace advice given to you by your health care provider. Make sure you discuss any questions you have with your health care provider. Document Released: 05/30/2015 Document Revised: 01/21/2016 Document Reviewed: 03/04/2015 Elsevier Interactive Patient Education  2017 [place] Prevention in the Home Falls can cause injuries. They can happen to people of all ages. There are many things you can do to make your home safe and to help prevent falls. What can I do on the outside of my home?  Regularly fix the edges of walkways and driveways and fix any cracks.  Remove anything that might make you trip as you walk through a door, such as a raised step or threshold.  Trim any bushes or trees on the path to your home.  Use bright outdoor lighting.  Clear any walking paths of anything that might make someone trip, such as rocks or tools.  Regularly check to see if handrails are loose or broken. Make sure that both sides of any steps have handrails.  Any raised decks and porches should have guardrails on the edges.  Have any leaves, snow, or ice cleared regularly.  Use sand or salt on walking paths during winter.  Clean up any spills in your garage right away. This includes oil or grease spills. What can I do in the bathroom?  Use night lights.  Install grab bars by the toilet and in the tub and shower. Do not use towel bars as grab bars.  Use non-skid mats or decals in the tub or shower.  If you need to sit down in the shower, use a plastic, non-slip stool.  Keep the floor dry. Clean up any water that spills on the floor as soon as it happens.  Remove soap buildup in the  tub or shower regularly.  Attach bath mats securely with double-sided non-slip rug tape.  Do not have throw rugs and other things on the floor that can make you trip. What can I do in the bedroom?  Use night lights.  Make sure that you have a light by your bed that is easy to reach.  Do not use any sheets or blankets that are too big for your bed. They should not hang down onto the floor.  Have a firm chair that has side arms. You can use this for support while you get dressed.  Do not have throw rugs and other things on the floor that can make you trip. What can I do in the kitchen?  Clean up any spills right away.  Avoid walking on wet floors.  Keep items that you use a lot in easy-to-reach places.  If you need to reach something above you, use a strong step stool that has a grab bar.  Keep electrical cords out of the way.  Do not use floor polish or wax that makes floors slippery. If you must use wax, use non-skid floor wax.  Do not  have throw rugs and other things on the floor that can make you trip. What can I do with my stairs?  Do not leave any items on the stairs.  Make sure that there are handrails on both sides of the stairs and use them. Fix handrails that are broken or loose. Make sure that handrails are as long as the stairways.  Check any carpeting to make sure that it is firmly attached to the stairs. Fix any carpet that is loose or worn.  Avoid having throw rugs at the top or bottom of the stairs. If you do have throw rugs, attach them to the floor with carpet tape.  Make sure that you have a light switch at the top of the stairs and the bottom of the stairs. If you do not have them, ask someone to add them for you. What else can I do to help prevent falls?  Wear shoes that:  Do not have high heels.  Have rubber bottoms.  Are comfortable and fit you well.  Are closed at the toe. Do not wear sandals.  If you use a stepladder:  Make sure that it  is fully opened. Do not climb a closed stepladder.  Make sure that both sides of the stepladder are locked into place.  Ask someone to hold it for you, if possible.  Clearly mark and make sure that you can see:  Any grab bars or handrails.  First and last steps.  Where the edge of each step is.  Use tools that help you move around (mobility aids) if they are needed. These include:  Canes.  Walkers.  Scooters.  Crutches.  Turn on the lights when you go into a dark area. Replace any light bulbs as soon as they burn out.  Set up your furniture so you have a clear path. Avoid moving your furniture around.  If any of your floors are uneven, fix them.  If there are any pets around you, be aware of where they are.  Review your medicines with your doctor. Some medicines can make you feel dizzy. This can increase your chance of falling. Ask your doctor what other things that you can do to help prevent falls. This information is not intended to replace advice given to you by your health care provider. Make sure you discuss any questions you have with your health care provider. Document Released: 02/27/2009 Document Revised: 10/09/2015 Document Reviewed: 06/07/2014 Elsevier Interactive Patient Education  2017 [employer].

## 2019-10-01 ENCOUNTER — Encounter: Payer: Self-pay | Admitting: Physician Assistant

## 2019-10-01 ENCOUNTER — Ambulatory Visit: Payer: Medicare PPO | Admitting: Physician Assistant

## 2019-10-01 ENCOUNTER — Other Ambulatory Visit: Payer: Self-pay

## 2019-10-01 VITALS — BP 122/82 | HR 85 | Temp 97.0°F | Resp 16 | Wt 224.2 lb

## 2019-10-01 DIAGNOSIS — M7071 Other bursitis of hip, right hip: Secondary | ICD-10-CM

## 2019-10-01 DIAGNOSIS — M25462 Effusion, left knee: Secondary | ICD-10-CM | POA: Diagnosis not present

## 2019-10-01 DIAGNOSIS — M25562 Pain in left knee: Secondary | ICD-10-CM

## 2019-10-01 NOTE — Assessment & Plan Note (Deleted)
Left knee pain. Continue wearing the knee Brace.  Referral placed to Orthopedic. Will hold off on the xray for hip and knee since he is being refer. Take OTC Tylenol 1000 mg TID as needed  OTC Voltaren Topical Gel

## 2019-10-01 NOTE — Progress Notes (Signed)
I,Henry [name],acting as a scribe for Henry Post, PA-C.,have documented all relevant documentation on the behalf of Henry Post, PA-C,as directed by  Henry Post, PA-C while in the presence of Henry Post, PA-C.  Established patient visit   Patient: Henry [name]   DOB: 18-Jun-1945   74 y.o. Male  MRN: [id number] Visit Date: 10/01/2019  Today's healthcare provider: Trinna Post, PA-C   Chief Complaint  Patient presents with  . Knee Pain   Subjective    Knee Pain  The incident occurred 2 days ago (Reports that this happened 2 months ago that he couldn't bend it but 2 days after it was fine.). There was no injury mechanism. The pain is present in the left knee (knee is better today-feels a little swollen.). The quality of the pain is described as aching. The patient is experiencing no pain. Pertinent negatives include no inability to bear weight, numbness or tingling. He reports no foreign bodies present. Exacerbated by: If he seats for a long time it hurts and he is not able to bend. He has tried NSAIDs (Knee brace, IBU day before yesterday took 4 pills ) for the symptoms. The treatment provided moderate relief.   Not able to take IBU due to his CKD. History of lymphoma followed by Dr. Jacinto Reap at [facility], last PET scan 04/2018. Insurance would not cover another. Also reports left hip pain that is aggravated by activity.      Medications: Outpatient Medications Prior to Visit  Medication Sig  . amLODipine (NORVASC) 5 MG tablet TAKE ONE TABLET BY MOUTH DAILY  . aspirin EC 81 MG tablet Take 81 mg by mouth daily.  . cholecalciferol (VITAMIN D) 1000 UNITS tablet Take 1,000 Units by mouth daily.   Marland Kitchen docusate sodium (COLACE) 100 MG capsule Take 100 mg by mouth daily.   Marland Kitchen ibuprofen (ADVIL,MOTRIN) 200 MG tablet Take 800 mg by mouth every 8 (eight) hours as needed (for pain).  Marland Kitchen lidocaine-prilocaine (EMLA) cream Apply 1 application topically as needed. To  port a cath  . lisinopril (ZESTRIL) 40 MG tablet TAKE ONE TABLET BY MOUTH DAILY  . omeprazole (PRILOSEC) 20 MG capsule TAKE ONE CAPSULE BY MOUTH DAILY  . phenylephrine-shark liver oil-mineral oil-petrolatum (PREPARATION H) 0.25-14-74.9 % rectal ointment Place 1 application rectally 2 (two) times daily as needed for hemorrhoids.  . TURMERIC PO Take by mouth.  . vitamin C (ASCORBIC ACID) 500 MG tablet Take 500 mg by mouth daily.  Marland Kitchen zinc gluconate 50 MG tablet Take 50 mg by mouth daily.  . cetirizine (ZYRTEC) 10 MG tablet Take 10 mg by mouth daily.   No facility-administered medications prior to visit.    Review of Systems  Constitutional: Negative for fatigue and fever.  Cardiovascular: Positive for leg swelling (Knee, left today). Negative for chest pain and palpitations.  Musculoskeletal:       Knee pain in the last 2 months-worsening Hip pain for the past 2-3 months ago-only last for 2 days then gets better  Neurological: Negative for tingling, weakness and numbness.      Objective    BP 122/82 (BP Location: Left Arm, Patient Position: Sitting, Cuff Size: Large)   Pulse 85   Temp (!) 97 F (36.1 C) (Temporal)   Resp 16   Wt 224 lb 3.2 oz (101.7 kg)   BMI 36.19 kg/m    Physical Exam Constitutional:      Appearance: Normal appearance. He is normal  weight.  Cardiovascular:     Rate and Rhythm: Normal rate and regular rhythm.  Musculoskeletal:     Right knee: Normal.     Left knee: Effusion and crepitus present. Normal range of motion. No tenderness. No medial joint line tenderness.  Skin:    General: Skin is warm and dry.  Neurological:     Mental Status: He is oriented to person, place, and time. Mental status is at baseline.  Psychiatric:        Mood and Affect: Mood normal.        Behavior: Behavior normal.       No results found for any visits on 10/01/19.  Assessment & Plan    1. Pain and swelling of left knee Left knee pain. Continue wearing the knee  Brace.  Referral placed to Orthopedic. Will hold off on the xray for hip and knee since he is being referred. Take OTC Tylenol 1000 mg TID as needed  OTC Voltaren Topical Gel - Ambulatory referral to Orthopedic Surgery  2. Bursitis of right hip, unspecified bursa -OTC Voltaren Gel Sparingly would have less systemic absorption.  -Take OTC Tylenol1000mg  TID as needed for pain - Ambulatory referral to Orthopedic Surgery       I, Henry Post, PA-C, have reviewed all documentation for this visit. The documentation on 10/02/19 for the exam, diagnosis, procedures, and orders are all accurate and complete.    Paulene Floor  [facility] [phone] (phone) [phone] (fax)  [place]

## 2019-10-01 NOTE — Patient Instructions (Addendum)
Take OTC Tylenol 1000 mg TID as needed Get OTC Voltaren Topical Gel

## 2019-10-29 ENCOUNTER — Other Ambulatory Visit
Admission: RE | Admit: 2019-10-29 | Discharge: 2019-10-29 | Disposition: A | Discharge status: Home / Self Care | Payer: Medicare PPO | Source: Ambulatory Visit | Attending: Orthopedic Surgery | Admitting: Orthopedic Surgery

## 2019-10-29 DIAGNOSIS — M25469 Effusion, unspecified knee: Secondary | ICD-10-CM | POA: Diagnosis present

## 2019-10-29 LAB — SYNOVIAL CELL COUNT + DIFF, W/ CRYSTALS
Eosinophils-Synovial: 0 %
Lymphocytes-Synovial Fld: 2 %
Monocyte-Macrophage-Synovial Fluid: 11 %
Neutrophil, Synovial: 87 %
WBC, Synovial: 11705 /mm3 — ABNORMAL HIGH (ref 0–200)

## 2019-11-01 LAB — BODY FLUID CULTURE: Culture: NO GROWTH

## 2019-11-24 ENCOUNTER — Other Ambulatory Visit: Payer: Self-pay | Admitting: Family Medicine

## 2019-11-24 DIAGNOSIS — I10 Essential (primary) hypertension: Secondary | ICD-10-CM

## 2019-11-24 NOTE — Telephone Encounter (Signed)
Requested Prescriptions  Pending Prescriptions Disp Refills  . omeprazole (PRILOSEC) 20 MG capsule [Pharmacy Med Name: OMEPRAZOLE DR 20 MG CAPSULE] 90 capsule 0    Sig: TAKE ONE CAPSULE BY MOUTH DAILY     Gastroenterology: Proton Pump Inhibitors Passed - 11/24/2019 11:40 AM      Passed - Valid encounter within last 12 months    Recent Outpatient Visits          1 month ago Pain and swelling of left knee   [facility] [place], Fabio Bering M, [place]   1 year ago Annual physical exam   [facility] Jerrol Banana., MD   1 year ago Essential (primary) hypertension   [facility] Jerrol Banana., [place]   2 years ago Annual physical exam   [facility] Jerrol Banana., [place]   2 years ago Axillary mass, left   [place], [place], [place]             . lisinopril (ZESTRIL) 40 MG tablet [Pharmacy Med Name: LISINOPRIL 40 MG TABLET] 90 tablet 0    Sig: TAKE ONE TABLET BY MOUTH DAILY     Cardiovascular:  ACE Inhibitors Failed - 11/24/2019 11:40 AM      Failed - Cr in normal range and within 180 days    Creatinine, Ser  Date Value Ref Range Status  06/27/2019 1.38 (H) 0.61 - 1.24 mg/dL Final         Passed - K in normal range and within 180 days    Potassium  Date Value Ref Range Status  06/27/2019 4.4 3.5 - 5.1 mmol/L Final         Passed - Patient is not pregnant      Passed - Last BP in normal range    BP Readings from Last 1 Encounters:  10/01/19 122/82         Passed - Valid encounter within last 6 months    Recent Outpatient Visits          1 month ago Pain and swelling of left knee   [facility] Trinna Post, [place]   1 year ago Annual physical exam   [facility] Jerrol Banana., [place]   1 year ago Essential (primary) hypertension   [facility] Jerrol Banana., [place]   2 years ago Annual physical exam   [facility] Jerrol Banana., [place]   2 years ago Axillary mass, left   [facility] [place], [place], [place]             . amLODipine (NORVASC) 5 MG tablet [Pharmacy Med Name: amLODIPine BESYLATE 5 MG TAB] 90 tablet 0    Sig: TAKE ONE TABLET BY MOUTH DAILY     Cardiovascular:  Calcium Channel Blockers Passed - 11/24/2019 11:40 AM      Passed - Last BP in normal range    BP Readings from Last 1 Encounters:  10/01/19 122/82         Passed - Valid encounter within last 6 months    Recent Outpatient Visits          1 month ago Pain and swelling of left knee   [facility] Trinna Post, [place]   1 year ago Annual physical exam   [facility] Jerrol Banana., [place]   1 year ago Essential (primary) hypertension   [facility] Jerrol Banana., [facility]   2 years ago Annual physical exam   [facility] Jerrol Banana., [place]   2 years ago Axillary mass, left   [place], [place]

## 2019-12-26 ENCOUNTER — Other Ambulatory Visit: Payer: Self-pay

## 2019-12-26 ENCOUNTER — Inpatient Hospital Stay: Payer: Medicare PPO | Admitting: Internal Medicine

## 2019-12-26 ENCOUNTER — Other Ambulatory Visit: Payer: Self-pay | Admitting: *Deleted

## 2019-12-26 ENCOUNTER — Inpatient Hospital Stay: Payer: Medicare PPO | Attending: Internal Medicine

## 2019-12-26 DIAGNOSIS — C8338 Diffuse large B-cell lymphoma, lymph nodes of multiple sites: Secondary | ICD-10-CM | POA: Insufficient documentation

## 2019-12-26 DIAGNOSIS — C8334 Diffuse large B-cell lymphoma, lymph nodes of axilla and upper limb: Secondary | ICD-10-CM

## 2019-12-26 DIAGNOSIS — I129 Hypertensive chronic kidney disease with stage 1 through stage 4 chronic kidney disease, or unspecified chronic kidney disease: Secondary | ICD-10-CM | POA: Diagnosis not present

## 2019-12-26 DIAGNOSIS — N183 Chronic kidney disease, stage 3 unspecified: Secondary | ICD-10-CM | POA: Insufficient documentation

## 2019-12-26 DIAGNOSIS — Z79899 Other long term (current) drug therapy: Secondary | ICD-10-CM | POA: Insufficient documentation

## 2019-12-26 LAB — COMPREHENSIVE METABOLIC PANEL
ALT: 16 U/L (ref 0–44)
AST: 16 U/L (ref 15–41)
Albumin: 4 g/dL (ref 3.5–5.0)
Alkaline Phosphatase: 67 U/L (ref 38–126)
Anion gap: 8 (ref 5–15)
BUN: 18 mg/dL (ref 8–23)
CO2: 25 mmol/L (ref 22–32)
Calcium: 9.1 mg/dL (ref 8.9–10.3)
Chloride: 108 mmol/L (ref 98–111)
Creatinine, Ser: 1.26 mg/dL — ABNORMAL HIGH (ref 0.61–1.24)
GFR calc Af Amer: >60 mL/min (ref >60)
GFR calc non Af Amer: 57 mL/min — ABNORMAL LOW (ref >60)
Glucose, Bld: 102 mg/dL — ABNORMAL HIGH (ref 70–99)
Potassium: 4.1 mmol/L (ref 3.5–5.1)
Sodium: 141 mmol/L (ref 135–145)
Total Bilirubin: 0.8 mg/dL (ref 0.3–1.2)
Total Protein: 7 g/dL (ref 6.5–8.1)

## 2019-12-26 LAB — CBC WITH DIFFERENTIAL/PLATELET
Abs Immature Granulocytes: 0.01 10*3/uL (ref 0.00–0.07)
Basophils Absolute: 0.1 10*3/uL (ref 0.0–0.1)
Basophils Relative: 1 %
Eosinophils Absolute: 0.2 10*3/uL (ref 0.0–0.5)
Eosinophils Relative: 4 %
HCT: 39.3 % (ref 39.0–52.0)
Hemoglobin: 13.6 g/dL (ref 13.0–17.0)
Immature Granulocytes: 0 %
Lymphocytes Relative: 18 %
Lymphs Abs: 1 10*3/uL (ref 0.7–4.0)
MCH: 31.2 pg (ref 26.0–34.0)
MCHC: 34.6 g/dL (ref 30.0–36.0)
MCV: 90.1 fL (ref 80.0–100.0)
Monocytes Absolute: 0.5 10*3/uL (ref 0.1–1.0)
Monocytes Relative: 9 %
Neutro Abs: 4 10*3/uL (ref 1.7–7.7)
Neutrophils Relative %: 68 %
Platelets: 193 10*3/uL (ref 150–400)
RBC: 4.36 MIL/uL (ref 4.22–5.81)
RDW: 13.1 % (ref 11.5–15.5)
WBC: 5.9 10*3/uL (ref 4.0–10.5)
nRBC: 0 % (ref 0.0–0.2)

## 2019-12-26 LAB — LACTATE DEHYDROGENASE: LDH: 120 U/L (ref 98–192)

## 2019-12-26 NOTE — Progress Notes (Signed)
[facility] OFFICE PROGRESS NOTE  Patient Care Team: Jerrol Banana., MD as PCP - General (Family Medicine) Cammie Sickle, MD as Consulting Physician (Medical Oncology) Noreene Filbert, MD as Referring Physician (Radiation Oncology) Anell Barr, OD as Consulting Physician (Optometry)  Cancer Staging No matching staging information was found for the patient.   Oncology History Overview Note  # AUG 6th 2018- DLBCL STAGE I [LEFT axilla- Core Bx- Dr.Sankar; CD-20, bcl-2;bcl-6 positive]; Ki- 67 >90%. MUM-1 NEG. GCB subtype; Myc- POS; FISH studies-NEGATIVE for gene RE-ARRANGEMENTS [positive for extra-copies of bcl-6/ myc/IgH/bcl-2]PET scan- Right Ax LN [4.5cm; SUV 35]  # R-CHOP [aug 20th 2018] x3 cycles; RT [starting 10/25; Nov 266 2018]; DEc 2018- CR ; except 8 mm left ax LN- no uptake.   # AUG 13th 2018. MUGA scan-56%.   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  male pleasant patient above history of diffuse large B-cell lymphoma is here for follow-up.  Patient denies any new lumps or bumps.  Appetite is good.  No weight loss.  No night sweats.  No fevers or chills.   Review of Systems  Constitutional: Negative for chills, diaphoresis, fever and weight loss.  HENT: Negative for nosebleeds and sore throat.   Eyes: Negative for double vision.  Respiratory: Negative for cough, hemoptysis, sputum production, shortness of breath and wheezing.   Cardiovascular: Negative for chest pain, palpitations, orthopnea and leg swelling.  Gastrointestinal: Negative for abdominal pain, blood in stool, constipation, diarrhea, heartburn, melena, nausea and vomiting.  Genitourinary: Negative for dysuria, frequency and urgency.  Musculoskeletal: Negative for back pain and joint pain.  Skin: Negative.   Negative for itching and rash.  Neurological: Negative for dizziness, focal weakness, weakness and headaches.  Endo/Heme/Allergies: Does not bruise/bleed easily.  Psychiatric/Behavioral: Negative for depression. The patient is not nervous/anxious and does not have insomnia.       PAST MEDICAL HISTORY :  Past Medical History:  Diagnosis Date  . Allergy   . Arthritis   . Cancer ([place])    left axillary  . Cataract   . GERD (gastroesophageal reflux disease)   . Hyperlipidemia   . Hypertension     PAST SURGICAL HISTORY :   Past Surgical History:  Procedure Laterality Date  . CATARACT EXTRACTION Bilateral   . MOLE REMOVAL    . NECK MASS EXCISION    . PORTACATH PLACEMENT N/A 12/29/2016   Procedure: INSERTION PORT-A-CATH;  Surgeon: Christene Lye, MD;  Location: [facility] ORS;  Service: General;  Laterality: N/A;  . TONSILLECTOMY      FAMILY HISTORY :   Family History  Problem Relation Age of Onset  . Heart disease Mother   . Rheumatic fever Mother   . CAD Mother   . Heart attack Father   . COPD Sister     SOCIAL HISTORY:   Social History   Tobacco Use  . Smoking status: Never Smoker  . Smokeless tobacco: Never Used  Vaping Use  . Vaping Use: Never used  Substance Use Topics  . Alcohol use: Yes    Alcohol/week: 8.0 standard drinks    Types: 8 Cans of beer per week    Comment: 2 beers four days a week  . Drug use: No    ALLERGIES:  is allergic to lovastatin, pravastatin, pravastatin sodium, simvastatin, statins, and sulfa antibiotics.  MEDICATIONS:  Current Outpatient Medications  Medication Sig Dispense Refill  . amLODipine (NORVASC) 5 MG tablet TAKE ONE TABLET BY MOUTH DAILY 90 tablet 0  . aspirin EC 81 MG tablet Take 81 mg by mouth daily.    . cetirizine (ZYRTEC) 10 MG tablet Take 10 mg by mouth daily.    . cholecalciferol (VITAMIN D) 1000 UNITS tablet Take 1,000 Units by mouth daily.     Marland Kitchen docusate sodium (COLACE) 100 MG capsule Take 100 mg by mouth  daily.     Marland Kitchen ibuprofen (ADVIL,MOTRIN) 200 MG tablet Take 800 mg by mouth every 8 (eight) hours as needed (for pain).    Marland Kitchen lidocaine-prilocaine (EMLA) cream Apply 1 application topically as needed. To port a cath 30 g 3  . lisinopril (ZESTRIL) 40 MG tablet TAKE ONE TABLET BY MOUTH DAILY 90 tablet 0  . omeprazole (PRILOSEC) 20 MG capsule TAKE ONE CAPSULE BY MOUTH DAILY 90 capsule 0  . phenylephrine-shark liver oil-mineral oil-petrolatum (PREPARATION H) 0.25-14-74.9 % rectal ointment Place 1 application rectally 2 (two) times daily as needed for hemorrhoids.    . TURMERIC PO Take by mouth.    . vitamin C (ASCORBIC ACID) 500 MG tablet Take 500 mg by mouth daily.    Marland Kitchen zinc gluconate 50 MG tablet Take 50 mg by mouth daily.     No current facility-administered medications for this visit.    PHYSICAL EXAMINATION: ECOG PERFORMANCE STATUS: 0 - Asymptomatic  BP 132/82 (BP Location: Right Arm, Patient Position: Sitting)   Pulse 72   Temp (!) 96.2 F (35.7 C) (Tympanic)   Resp 16   Wt 222 lb (100.7 kg)   SpO2 98%   BMI 35.83 kg/m   Filed Weights   12/26/19 1008  Weight: 222 lb (100.7 kg)    Physical Exam HENT:     Head: Normocephalic and atraumatic.     Mouth/Throat:     Pharynx: No oropharyngeal exudate.  Eyes:     Pupils: Pupils are equal, round, and reactive to light.  Cardiovascular:     Rate and Rhythm: Normal rate and regular rhythm.  Pulmonary:     Effort: No respiratory distress.     Breath sounds: No wheezing.  Abdominal:     General: Bowel sounds are normal. There is no distension.     Palpations: Abdomen is soft. There is no mass.     Tenderness: There is no abdominal tenderness. There is no guarding or rebound.  Musculoskeletal:        General: No tenderness. Normal range of motion.     Cervical back: Normal range of motion and neck supple.  Skin:    General: Skin is warm.  Neurological:     Mental Status: He is alert and oriented to person, place, and time.   Psychiatric:        Mood and Affect: Affect normal.        LABORATORY DATA:  I have reviewed the data as listed    Component Value Date/Time   NA 141 12/26/2019 0954   NA 142 08/23/2017 1128   K 4.1 12/26/2019 0954   CL 108 12/26/2019 0954   CO2 25 12/26/2019 0954   GLUCOSE 102 (H) 12/26/2019 0954   BUN 18 12/26/2019 0954   BUN 17 08/23/2017 1128   CREATININE 1.26 (H) 12/26/2019 0954   CALCIUM 9.1 12/26/2019 0954   PROT 7.0 12/26/2019 0954  PROT 6.6 08/23/2017 1128   ALBUMIN 4.0 12/26/2019 0954   ALBUMIN 4.4 08/23/2017 1128   AST 16 12/26/2019 0954   ALT 16 12/26/2019 0954   ALKPHOS 67 12/26/2019 0954   BILITOT 0.8 12/26/2019 0954   BILITOT 0.6 08/23/2017 1128   GFRNONAA 57 (L) 12/26/2019 0954   GFRAA >60 12/26/2019 0954    No results found for: SPEP, UPEP  Lab Results  Component Value Date   WBC 5.9 12/26/2019   NEUTROABS 4.0 12/26/2019   HGB 13.6 12/26/2019   HCT 39.3 12/26/2019   MCV 90.1 12/26/2019   PLT 193 12/26/2019      Chemistry      Component Value Date/Time   NA 141 12/26/2019 0954   NA 142 08/23/2017 1128   K 4.1 12/26/2019 0954   CL 108 12/26/2019 0954   CO2 25 12/26/2019 0954   BUN 18 12/26/2019 0954   BUN 17 08/23/2017 1128   CREATININE 1.26 (H) 12/26/2019 0954   GLU 91 11/06/2013 0000      Component Value Date/Time   CALCIUM 9.1 12/26/2019 0954   ALKPHOS 67 12/26/2019 0954   AST 16 12/26/2019 0954   ALT 16 12/26/2019 0954   BILITOT 0.8 12/26/2019 0954   BILITOT 0.6 08/23/2017 1128       RADIOGRAPHIC STUDIES: I have personally reviewed the radiological images as listed and agreed with the findings in the report. No results found.   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 s/p  R-CHOP s/p cycle #3; s/p IFRT;  December 2019-PET scan shows no significant uptake or any concerns for pathologic lymphadenopathy. STABLE.   #  No evidence of  progression or recurrence [stable left axillary 1 cm lymph node]- STABLE.  # CKD -stage III  # DISPOSITION: # follow up in 6 months-MD/cbc/cmp/ldh/Dr.B     No orders of the defined types were placed in this encounter.  All questions were answered. The patient knows to call the clinic with any problems, questions or concerns.      Cammie Sickle, MD 12/26/2019 10:55 AM

## 2019-12-26 NOTE — Assessment & Plan Note (Addendum)
Diffuse large B-cell lymphoma- stage I. s/p  R-CHOP s/p cycle #3; s/p IFRT;  December 2019-PET scan shows no significant uptake or any concerns for pathologic lymphadenopathy. STABLE.   #  No evidence of progression or recurrence [stable left axillary 1 cm lymph node]- STABLE.  # CKD -stage III  # DISPOSITION: # follow up in 6 months-MD/cbc/cmp/ldh/Dr.B

## 2020-02-23 ENCOUNTER — Other Ambulatory Visit: Payer: Self-pay | Admitting: Family Medicine

## 2020-02-23 DIAGNOSIS — I10 Essential (primary) hypertension: Secondary | ICD-10-CM

## 2020-02-23 NOTE — Telephone Encounter (Signed)
Requested Prescriptions  Pending Prescriptions Disp Refills  . omeprazole (PRILOSEC) 20 MG capsule [Pharmacy Med Name: OMEPRAZOLE DR 20 MG CAPSULE] 90 capsule 0    Sig: TAKE ONE CAPSULE BY MOUTH DAILY     Gastroenterology: Proton Pump Inhibitors Passed - 02/23/2020 12:10 PM      Passed - Valid encounter within last 12 months    Recent Outpatient Visits          4 months ago Pain and swelling of left knee   [facility] Trinna Post, [place]   1 year ago Annual physical exam   [facility] Jerrol Banana., [place]   2 years ago Essential (primary) hypertension   [facility] Jerrol Banana., [place]   2 years ago Annual physical exam   [facility] Jerrol Banana., [place]   3 years ago Axillary mass, left   [facility] [place], [place], [place]             . amLODipine (NORVASC) 5 MG tablet [Pharmacy Med Name: amLODIPine BESYLATE 5 MG TAB] 90 tablet 0    Sig: TAKE ONE TABLET BY MOUTH DAILY     Cardiovascular:  Calcium Channel Blockers Passed - 02/23/2020 12:10 PM      Passed - Last BP in normal range    BP Readings from Last 1 Encounters:  12/26/19 132/82         Passed - Valid encounter within last 6 months    Recent Outpatient Visits          4 months ago Pain and swelling of left knee   [facility] Trinna Post, [place]   1 year ago Annual physical exam   [facility] Jerrol Banana., [place]   2 years ago Essential (primary) hypertension   [facility] Jerrol Banana., [place]   2 years ago Annual physical exam   [facility] Jerrol Banana., [place]   3 years ago Axillary mass, left   [facility] [place], [place], [place]             . lisinopril (ZESTRIL) 40 MG tablet [Pharmacy Med Name: LISINOPRIL 40 MG TABLET] 90 tablet 0    Sig: TAKE ONE TABLET BY MOUTH DAILY     Cardiovascular:  ACE Inhibitors Failed -  02/23/2020 12:10 PM      Failed - Cr in normal range and within 180 days    Creatinine, Ser  Date Value Ref Range Status  12/26/2019 1.26 (H) 0.61 - 1.24 mg/dL Final         Passed - K in normal range and within 180 days    Potassium  Date Value Ref Range Status  12/26/2019 4.1 3.5 - 5.1 mmol/L Final         Passed - Patient is not pregnant      Passed - Last BP in normal range    BP Readings from Last 1 Encounters:  12/26/19 132/82         Passed - Valid encounter within last 6 months    Recent Outpatient Visits          4 months ago Pain and swelling of left knee   [facility] Trinna Post, [place]   1 year ago Annual physical exam   [facility] Jerrol Banana., [place]   2 years ago Essential (primary) hypertension   [facility] Jerrol Banana., [place]   2 years ago Annual physical exam   [facility] Jerrol Banana., [place]   3 years ago Axillary mass, left   [place], [place]

## 2020-03-20 ENCOUNTER — Other Ambulatory Visit: Payer: Self-pay

## 2020-03-20 ENCOUNTER — Ambulatory Visit (INDEPENDENT_AMBULATORY_CARE_PROVIDER_SITE_OTHER): Payer: Medicare PPO

## 2020-03-20 DIAGNOSIS — Z23 Encounter for immunization: Secondary | ICD-10-CM | POA: Diagnosis not present

## 2020-04-08 ENCOUNTER — Ambulatory Visit: Payer: Medicare PPO

## 2020-06-25 ENCOUNTER — Other Ambulatory Visit: Payer: Self-pay

## 2020-06-25 ENCOUNTER — Inpatient Hospital Stay: Payer: Medicare PPO | Attending: Internal Medicine

## 2020-06-25 ENCOUNTER — Inpatient Hospital Stay: Payer: Medicare PPO | Admitting: Internal Medicine

## 2020-06-25 ENCOUNTER — Encounter: Payer: Self-pay | Admitting: Internal Medicine

## 2020-06-25 DIAGNOSIS — N183 Chronic kidney disease, stage 3 unspecified: Secondary | ICD-10-CM | POA: Diagnosis not present

## 2020-06-25 DIAGNOSIS — C8334 Diffuse large B-cell lymphoma, lymph nodes of axilla and upper limb: Secondary | ICD-10-CM | POA: Diagnosis not present

## 2020-06-25 DIAGNOSIS — Z8572 Personal history of non-Hodgkin lymphomas: Secondary | ICD-10-CM | POA: Insufficient documentation

## 2020-06-25 DIAGNOSIS — M1712 Unilateral primary osteoarthritis, left knee: Secondary | ICD-10-CM | POA: Diagnosis not present

## 2020-06-25 LAB — COMPREHENSIVE METABOLIC PANEL
ALT: 14 U/L (ref 0–44)
AST: 15 U/L (ref 15–41)
Albumin: 4 g/dL (ref 3.5–5.0)
Alkaline Phosphatase: 69 U/L (ref 38–126)
Anion gap: 8 (ref 5–15)
BUN: 21 mg/dL (ref 8–23)
CO2: 25 mmol/L (ref 22–32)
Calcium: 9.3 mg/dL (ref 8.9–10.3)
Chloride: 107 mmol/L (ref 98–111)
Creatinine, Ser: 1.28 mg/dL — ABNORMAL HIGH (ref 0.61–1.24)
GFR, Estimated: 59 mL/min — ABNORMAL LOW (ref >60)
Glucose, Bld: 103 mg/dL — ABNORMAL HIGH (ref 70–99)
Potassium: 4.3 mmol/L (ref 3.5–5.1)
Sodium: 140 mmol/L (ref 135–145)
Total Bilirubin: 0.7 mg/dL (ref 0.3–1.2)
Total Protein: 6.9 g/dL (ref 6.5–8.1)

## 2020-06-25 LAB — CBC WITH DIFFERENTIAL/PLATELET
Abs Immature Granulocytes: 0.02 10*3/uL (ref 0.00–0.07)
Basophils Absolute: 0.1 10*3/uL (ref 0.0–0.1)
Basophils Relative: 1 %
Eosinophils Absolute: 0.3 10*3/uL (ref 0.0–0.5)
Eosinophils Relative: 4 %
HCT: 40.8 % (ref 39.0–52.0)
Hemoglobin: 14.2 g/dL (ref 13.0–17.0)
Immature Granulocytes: 0 %
Lymphocytes Relative: 15 %
Lymphs Abs: 1 10*3/uL (ref 0.7–4.0)
MCH: 31.4 pg (ref 26.0–34.0)
MCHC: 34.8 g/dL (ref 30.0–36.0)
MCV: 90.3 fL (ref 80.0–100.0)
Monocytes Absolute: 0.6 10*3/uL (ref 0.1–1.0)
Monocytes Relative: 9 %
Neutro Abs: 4.4 10*3/uL (ref 1.7–7.7)
Neutrophils Relative %: 71 %
Platelets: 187 10*3/uL (ref 150–400)
RBC: 4.52 MIL/uL (ref 4.22–5.81)
RDW: 13.1 % (ref 11.5–15.5)
WBC: 6.3 10*3/uL (ref 4.0–10.5)
nRBC: 0 % (ref 0.0–0.2)

## 2020-06-25 LAB — LACTATE DEHYDROGENASE: LDH: 117 U/L (ref 98–192)

## 2020-06-25 NOTE — Progress Notes (Signed)
[facility] OFFICE PROGRESS NOTE  Patient Care Team: Jerrol Banana., MD as PCP - General (Family Medicine) Cammie Sickle, MD as Consulting Physician (Medical Oncology) Noreene Filbert, MD as Referring Physician (Radiation Oncology) Anell Barr, OD as Consulting Physician (Optometry)  Cancer Staging No matching staging information was found for the patient.   Oncology History Overview Note  # AUG 6th 2018- DLBCL STAGE I [LEFT axilla- Core Bx- Dr.Sankar; CD-20, bcl-2;bcl-6 positive]; Ki- 67 >90%. MUM-1 NEG. GCB subtype; Myc- POS; FISH studies-NEGATIVE for gene RE-ARRANGEMENTS [positive for extra-copies of bcl-6/ myc/IgH/bcl-2]PET scan- Right Ax LN [4.5cm; SUV 35]  # R-CHOP [aug 20th 2018] x3 cycles; RT [starting 10/25; Nov 266 2018]; DEc 2018- CR ; except 8 mm left ax LN- no uptake.   # AUG 13th 2018. MUGA scan-56%.   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  male pleasant patient above history of diffuse large B-cell lymphoma is here for follow-up.  Denies any lumps or bumps.  No fever no chills.  No nausea no vomiting.  No headaches.  Complains of knee pain left for which he had gel injection.  Improved.   Review of Systems  Constitutional: Negative for chills, diaphoresis, fever and weight loss.  HENT: Negative for nosebleeds and sore throat.   Eyes: Negative for double vision.  Respiratory: Negative for cough, hemoptysis, sputum production, shortness of breath and wheezing.   Cardiovascular: Negative for chest pain, palpitations, orthopnea and leg swelling.  Gastrointestinal: Negative for abdominal pain, blood in stool, constipation, diarrhea, heartburn, melena, nausea and vomiting.  Genitourinary: Negative for dysuria, frequency and urgency.  Musculoskeletal: Positive for  joint pain. Negative for back pain.  Skin: Negative.  Negative for itching and rash.  Neurological: Negative for dizziness, focal weakness, weakness and headaches.  Endo/Heme/Allergies: Does not bruise/bleed easily.  Psychiatric/Behavioral: Negative for depression. The patient is not nervous/anxious and does not have insomnia.       PAST MEDICAL HISTORY :  Past Medical History:  Diagnosis Date  . Allergy   . Arthritis   . Cancer ([place])    left axillary  . Cataract   . GERD (gastroesophageal reflux disease)   . Hyperlipidemia   . Hypertension     PAST SURGICAL HISTORY :   Past Surgical History:  Procedure Laterality Date  . CATARACT EXTRACTION Bilateral   . MOLE REMOVAL    . NECK MASS EXCISION    . PORTACATH PLACEMENT N/A 12/29/2016   Procedure: INSERTION PORT-A-CATH;  Surgeon: Christene Lye, MD;  Location: [facility] ORS;  Service: General;  Laterality: N/A;  . TONSILLECTOMY      FAMILY HISTORY :   Family History  Problem Relation Age of Onset  . Heart disease Mother   . Rheumatic fever Mother   . CAD Mother   . Heart attack Father   . COPD Sister     SOCIAL HISTORY:   Social History   Tobacco Use  . Smoking status: Never Smoker  . Smokeless tobacco: Never Used  Vaping Use  . Vaping Use: Never used  Substance Use Topics  . Alcohol use: Yes    Alcohol/week: 8.0 standard drinks    Types: 8 Cans of beer per week    Comment: 2 beers four days a week  .  Drug use: No    ALLERGIES:  is allergic to lovastatin, pravastatin, pravastatin sodium, simvastatin, statins, and sulfa antibiotics.  MEDICATIONS:  Current Outpatient Medications  Medication Sig Dispense Refill  . amLODipine (NORVASC) 5 MG tablet TAKE ONE TABLET BY MOUTH DAILY 90 tablet 3  . aspirin EC 81 MG tablet Take 81 mg by mouth daily.    . cetirizine (ZYRTEC) 10 MG tablet Take 10 mg by mouth daily.    . cholecalciferol (VITAMIN D) 1000 UNITS tablet Take 1,000 Units by mouth daily.     Marland Kitchen docusate  sodium (COLACE) 100 MG capsule Take 100 mg by mouth daily.     Marland Kitchen ibuprofen (ADVIL,MOTRIN) 200 MG tablet Take 800 mg by mouth every 8 (eight) hours as needed (for pain).    Marland Kitchen lidocaine-prilocaine (EMLA) cream Apply 1 application topically as needed. To port a cath 30 g 3  . lisinopril (ZESTRIL) 40 MG tablet TAKE ONE TABLET BY MOUTH DAILY 90 tablet 3  . omeprazole (PRILOSEC) 20 MG capsule TAKE ONE CAPSULE BY MOUTH DAILY 90 capsule 3  . phenylephrine-shark liver oil-mineral oil-petrolatum (PREPARATION H) 0.25-14-74.9 % rectal ointment Place 1 application rectally 2 (two) times daily as needed for hemorrhoids.    . TURMERIC PO Take by mouth.    . vitamin C (ASCORBIC ACID) 500 MG tablet Take 500 mg by mouth daily.    Marland Kitchen zinc gluconate 50 MG tablet Take 50 mg by mouth daily.     No current facility-administered medications for this visit.    PHYSICAL EXAMINATION: ECOG PERFORMANCE STATUS: 0 - Asymptomatic  BP 126/88 (BP Location: Left Arm, Patient Position: Sitting)   Pulse 74   Temp 98 F (36.7 C) (Tympanic)   Resp 20   Wt 224 lb 2 oz (101.7 kg)   SpO2 99%   BMI 36.17 kg/m   Filed Weights   06/25/20 1001  Weight: 224 lb 2 oz (101.7 kg)    Physical Exam HENT:     Head: Normocephalic and atraumatic.     Mouth/Throat:     Pharynx: No oropharyngeal exudate.  Eyes:     Pupils: Pupils are equal, round, and reactive to light.  Cardiovascular:     Rate and Rhythm: Normal rate and regular rhythm.  Pulmonary:     Effort: No respiratory distress.     Breath sounds: No wheezing.  Abdominal:     General: Bowel sounds are normal. There is no distension.     Palpations: Abdomen is soft. There is no mass.     Tenderness: There is no abdominal tenderness. There is no guarding or rebound.  Musculoskeletal:        General: No tenderness. Normal range of motion.     Cervical back: Normal range of motion and neck supple.  Skin:    General: Skin is warm.  Neurological:     Mental Status: He  is alert and oriented to person, place, and time.  Psychiatric:        Mood and Affect: Affect normal.        LABORATORY DATA:  I have reviewed the data as listed    Component Value Date/Time   NA 140 06/25/2020 0946   NA 142 08/23/2017 1128   K 4.3 06/25/2020 0946   CL 107 06/25/2020 0946   CO2 25 06/25/2020 0946   GLUCOSE 103 (H) 06/25/2020 0946   BUN 21 06/25/2020 0946   BUN 17 08/23/2017 1128   CREATININE 1.28 (H) 06/25/2020 2952  CALCIUM 9.3 06/25/2020 0946   PROT 6.9 06/25/2020 0946   PROT 6.6 08/23/2017 1128   ALBUMIN 4.0 06/25/2020 0946   ALBUMIN 4.4 08/23/2017 1128   AST 15 06/25/2020 0946   ALT 14 06/25/2020 0946   ALKPHOS 69 06/25/2020 0946   BILITOT 0.7 06/25/2020 0946   BILITOT 0.6 08/23/2017 1128   GFRNONAA 59 (L) 06/25/2020 0946   GFRAA >60 12/26/2019 0954    No results found for: SPEP, UPEP  Lab Results  Component Value Date   WBC 6.3 06/25/2020   NEUTROABS 4.4 06/25/2020   HGB 14.2 06/25/2020   HCT 40.8 06/25/2020   MCV 90.3 06/25/2020   PLT 187 06/25/2020      Chemistry      Component Value Date/Time   NA 140 06/25/2020 0946   NA 142 08/23/2017 1128   K 4.3 06/25/2020 0946   CL 107 06/25/2020 0946   CO2 25 06/25/2020 0946   BUN 21 06/25/2020 0946   BUN 17 08/23/2017 1128   CREATININE 1.28 (H) 06/25/2020 0946   GLU 91 11/06/2013 0000      Component Value Date/Time   CALCIUM 9.3 06/25/2020 0946   ALKPHOS 69 06/25/2020 0946   AST 15 06/25/2020 0946   ALT 14 06/25/2020 0946   BILITOT 0.7 06/25/2020 0946   BILITOT 0.6 08/23/2017 1128       RADIOGRAPHIC STUDIES: I have personally reviewed the radiological images as listed and agreed with the findings in the report. No results found.   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 s/p  R-CHOP s/p cycle #3; s/p IFRT;  December 2019-PET scan shows no significant uptake or any concerns for pathologic  lymphadenopathy- STABLE.  Continue surveillance without imaging at this time.  #  No evidence of progression or recurrence [stable left axillary 1 cm lymph node]- STABLE  # CKD -stage III- STABLE  # Left knee arthritis s/p gel injection; ? TKA.   # DISPOSITION: # follow up in 6 months-MD/cbc/cmp/ldh/Dr.B     Orders Placed This Encounter  Procedures  . CBC with Differential    Standing Status:   Future    Standing Expiration Date:   06/25/2021  . Comprehensive metabolic panel    Standing Status:   Future    Standing Expiration Date:   06/25/2021  . Lactate dehydrogenase    Standing Status:   Future    Standing Expiration Date:   06/25/2021   All questions were answered. The patient knows to call the clinic with any problems, questions or concerns.      Cammie Sickle, MD 06/25/2020 10:34 AM

## 2020-06-25 NOTE — Assessment & Plan Note (Addendum)
Diffuse large B-cell lymphoma- stage I. s/p  R-CHOP s/p cycle #3; s/p IFRT;  December 2019-PET scan shows no significant uptake or any concerns for pathologic lymphadenopathy- STABLE.  Continue surveillance without imaging at this time.  #  No evidence of progression or recurrence [stable left axillary 1 cm lymph node]- STABLE  # CKD -stage III- STABLE  # Left knee arthritis s/p gel injection; ? TKA.   # DISPOSITION: # follow up in 6 months-MD/cbc/cmp/ldh/Dr.B

## 2020-10-27 ENCOUNTER — Other Ambulatory Visit: Payer: Self-pay | Admitting: Orthopedic Surgery

## 2020-11-03 ENCOUNTER — Other Ambulatory Visit
Admission: RE | Admit: 2020-11-03 | Discharge: 2020-11-03 | Disposition: A | Discharge status: Home / Self Care | Payer: Medicare PPO | Source: Ambulatory Visit | Attending: Orthopedic Surgery | Admitting: Orthopedic Surgery

## 2020-11-03 ENCOUNTER — Other Ambulatory Visit: Payer: Self-pay

## 2020-11-03 ENCOUNTER — Ambulatory Visit: Payer: Medicare PPO | Admitting: Family Medicine

## 2020-11-03 ENCOUNTER — Encounter: Payer: Self-pay | Admitting: Family Medicine

## 2020-11-03 ENCOUNTER — Ambulatory Visit
Admission: RE | Admit: 2020-11-03 | Discharge: 2020-11-03 | Disposition: A | Discharge status: Home / Self Care | Payer: Medicare PPO | Source: Ambulatory Visit | Attending: Orthopedic Surgery | Admitting: Orthopedic Surgery

## 2020-11-03 VITALS — BP 125/84 | HR 70 | Temp 98.5°F | Resp 16 | Ht 66.0 in | Wt 226.0 lb

## 2020-11-03 DIAGNOSIS — Z01818 Encounter for other preprocedural examination: Secondary | ICD-10-CM | POA: Diagnosis present

## 2020-11-03 DIAGNOSIS — I10 Essential (primary) hypertension: Secondary | ICD-10-CM | POA: Diagnosis not present

## 2020-11-03 DIAGNOSIS — E7800 Pure hypercholesterolemia, unspecified: Secondary | ICD-10-CM | POA: Diagnosis not present

## 2020-11-03 DIAGNOSIS — C8334 Diffuse large B-cell lymphoma, lymph nodes of axilla and upper limb: Secondary | ICD-10-CM

## 2020-11-03 DIAGNOSIS — Z01811 Encounter for preprocedural respiratory examination: Secondary | ICD-10-CM

## 2020-11-03 HISTORY — DX: Pneumonia, unspecified organism: J18.9

## 2020-11-03 LAB — URINALYSIS, COMPLETE (UACMP) WITH MICROSCOPIC
Bacteria, UA: NONE SEEN
Bilirubin Urine: NEGATIVE
Glucose, UA: NEGATIVE mg/dL
Ketones, ur: NEGATIVE mg/dL
Leukocytes,Ua: NEGATIVE
Nitrite: NEGATIVE
Protein, ur: NEGATIVE mg/dL
Specific Gravity, Urine: 1.008 (ref 1.005–1.030)
pH: 6 (ref 5.0–8.0)

## 2020-11-03 LAB — CBC WITH DIFFERENTIAL/PLATELET
Abs Immature Granulocytes: 0.04 10*3/uL (ref 0.00–0.07)
Basophils Absolute: 0.1 10*3/uL (ref 0.0–0.1)
Basophils Relative: 2 %
Eosinophils Absolute: 0.3 10*3/uL (ref 0.0–0.5)
Eosinophils Relative: 4 %
HCT: 41.4 % (ref 39.0–52.0)
Hemoglobin: 14.1 g/dL (ref 13.0–17.0)
Immature Granulocytes: 1 %
Lymphocytes Relative: 16 %
Lymphs Abs: 1 10*3/uL (ref 0.7–4.0)
MCH: 31.1 pg (ref 26.0–34.0)
MCHC: 34.1 g/dL (ref 30.0–36.0)
MCV: 91.2 fL (ref 80.0–100.0)
Monocytes Absolute: 0.6 10*3/uL (ref 0.1–1.0)
Monocytes Relative: 9 %
Neutro Abs: 4.3 10*3/uL (ref 1.7–7.7)
Neutrophils Relative %: 68 %
Platelets: 231 10*3/uL (ref 150–400)
RBC: 4.54 MIL/uL (ref 4.22–5.81)
RDW: 12.6 % (ref 11.5–15.5)
WBC: 6.2 10*3/uL (ref 4.0–10.5)
nRBC: 0 % (ref 0.0–0.2)

## 2020-11-03 LAB — BASIC METABOLIC PANEL
Anion gap: 5 (ref 5–15)
BUN: 16 mg/dL (ref 8–23)
CO2: 27 mmol/L (ref 22–32)
Calcium: 9.5 mg/dL (ref 8.9–10.3)
Chloride: 108 mmol/L (ref 98–111)
Creatinine, Ser: 1.21 mg/dL (ref 0.61–1.24)
GFR, Estimated: >60 mL/min (ref >60)
Glucose, Bld: 93 mg/dL (ref 70–99)
Potassium: 4.5 mmol/L (ref 3.5–5.1)
Sodium: 140 mmol/L (ref 135–145)

## 2020-11-03 LAB — HEMOGLOBIN A1C
Hgb A1c MFr Bld: 5.6 % (ref 4.8–5.6)
Mean Plasma Glucose: 114 mg/dL

## 2020-11-03 LAB — APTT: aPTT: 32 seconds (ref 24–36)

## 2020-11-03 LAB — SURGICAL PCR SCREEN
MRSA, PCR: NEGATIVE
Staphylococcus aureus: NEGATIVE

## 2020-11-03 LAB — PROTIME-INR
INR: 1 (ref 0.8–1.2)
Prothrombin Time: 13.5 seconds (ref 11.4–15.2)

## 2020-11-03 LAB — TYPE AND SCREEN
ABO/RH(D): A POS
Antibody Screen: NEGATIVE

## 2020-11-03 NOTE — Progress Notes (Signed)
Established patient visit   Patient: Henry [name]   DOB: August 07, 1945   75 y.o. Male  MRN: [id number] Visit Date: 11/03/2020  Today's healthcare provider: Wilhemena Durie, MD   Chief Complaint  Patient presents with   Pre-op Exam   Subjective    HPI  Patient is here for pre-op visit for left TKA. He feels well.  No chest pain shortness of breath dyspnea on exertion PND orthopnea or neurologic symptoms.      Medications: Outpatient Medications Prior to Visit  Medication Sig   amLODipine (NORVASC) 5 MG tablet TAKE ONE TABLET BY MOUTH DAILY (Patient taking differently: Take 5 mg by mouth daily.)   aspirin EC 81 MG tablet Take 81 mg by mouth daily.   cetirizine (ZYRTEC) 10 MG tablet Take 10 mg by mouth daily.   Cholecalciferol (VITAMIN D) 50 MCG (2000 UT) CAPS Take 2,000 Units by mouth daily.   clotrimazole-betamethasone (LOTRISONE) cream Apply 1 application topically daily as needed (Rash).   docusate sodium (COLACE) 50 MG capsule Take 50 mg by mouth daily.   ibuprofen (ADVIL,MOTRIN) 200 MG tablet Take 800 mg by mouth every 8 (eight) hours as needed (for pain).   lisinopril (ZESTRIL) 40 MG tablet TAKE ONE TABLET BY MOUTH DAILY (Patient taking differently: Take 40 mg by mouth daily.)   omeprazole (PRILOSEC) 20 MG capsule TAKE ONE CAPSULE BY MOUTH DAILY (Patient taking differently: Take 20 mg by mouth daily.)   phenylephrine-shark liver oil-mineral oil-petrolatum (PREPARATION H) 0.25-14-74.9 % rectal ointment Place 1 application rectally 2 (two) times daily as needed for hemorrhoids.   TURMERIC PO Take 2,000 mg by mouth daily.   ZINC GLUCONATE PO Take by mouth daily. Immun[facility] Health   No facility-administered medications prior to visit.    Review of Systems  Constitutional:  Negative for appetite change, chills and fever.  Respiratory:  Negative for chest tightness, shortness of breath and wheezing.   Cardiovascular:  Negative for chest pain and palpitations.   Gastrointestinal:  Negative for abdominal pain, nausea and vomiting.       Objective    BP 125/84   Pulse 70   Temp 98.5 F (36.9 C)   Resp 16   Ht 5\' 6"  (1.676 m)   Wt 226 lb (102.5 kg)   BMI 36.48 kg/m  BP Readings from Last 3 Encounters:  11/03/20 125/84  06/25/20 126/88  12/26/19 132/82   Wt Readings from Last 3 Encounters:  11/03/20 226 lb (102.5 kg)  06/25/20 224 lb 2 oz (101.7 kg)  12/26/19 222 lb (100.7 kg)       Physical Exam Vitals reviewed.  Constitutional:      Appearance: He is well-developed.  HENT:     Head: Normocephalic and atraumatic.     Right Ear: External ear normal.     Left Ear: External ear normal.     Nose: Nose normal.  Eyes:     General: No scleral icterus.    Conjunctiva/sclera: Conjunctivae normal.  Neck:     Thyroid: No thyromegaly.  Cardiovascular:     Rate and Rhythm: Normal rate and regular rhythm.     Heart sounds: Normal heart sounds.  Pulmonary:     Effort: Pulmonary effort is normal.     Breath sounds: Normal breath sounds.  Abdominal:     Palpations: Abdomen is soft.  Skin:    General: Skin is warm and dry.  Neurological:     General: No focal deficit present.  Mental Status: He is alert and oriented to person, place, and time.  Psychiatric:        Mood and Affect: Mood normal.        Behavior: Behavior normal.        Thought Content: Thought content normal.        Judgment: Judgment normal.      No results found for any visits on 11/03/20.  Assessment & Plan     1. Pre-op evaluation Cleared for surgery.  Stop aspirin until 3 days after surgery - EKG 12-Lead  2. Essential (primary) hypertension Good control  3. 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 Hypercholesterolemia without hypertriglyceridemia    No follow-ups on file.      I, Wilhemena Durie, MD, have reviewed all documentation for this visit. The documentation on 11/07/20  for the exam, diagnosis, procedures, and orders are all accurate and complete.    Jaylen Claude Cranford Mon, MD  [facility] [phone] (phone) [phone] (fax)  [place]

## 2020-11-03 NOTE — Patient Instructions (Addendum)
Your procedure is scheduled on: 11/11/20 - Tuesday Report to the Registration Desk on the 1st floor of the [facility]. To find out your arrival time, please call [phone] between 1PM - 3PM on: 11/10/20 - Monday Report to [facility] on 11/07/20 at 8:10 am for Covid Test.  REMEMBER: Instructions that are not followed completely may result in serious medical risk, up to and including death; or upon the discretion of your surgeon and anesthesiologist your surgery may need to be rescheduled.  Do not eat food or drink any fluids after midnight the night before surgery.  No gum chewing, lozengers or hard candies.  TAKE THESE MEDICATIONS THE MORNING OF SURGERY WITH A SIP OF WATER:   - amLODipine (NORVASC) 5 MG tablet - omeprazole (PRILOSEC) 20 MG capsule, take one the night before and one on the morning of surgery - helps to prevent nausea after surgery.  Follow recommendations from Cardiologist, Pulmonologist or PCP regarding stopping Aspirin, Coumadin, Plavix, Eliquis, Pradaxa, or Pletal. Stop taking Aspirin 81 mg beginning 11/03/20.  One week prior to surgery: Last dose on 11/03/20 Stop Anti-inflammatories (NSAIDS) such as Advil, Aleve, Ibuprofen, Motrin, Naproxen, Naprosyn and Aspirin based products such as Excedrin, Goodys Powder, BC Powder.  Stop ANY OVER THE COUNTER supplements until after surgery.  You may take Tylenol as directed if needed for pain up until the day of surgery.  No Alcohol for 24 hours before or after surgery.  No Smoking including e-cigarettes for 24 hours prior to surgery.  No chewable tobacco products for at least 6 hours prior to surgery.  No nicotine patches on the day of surgery.  Do not use any "recreational" drugs for at least a week prior to your surgery.  Please be advised that the combination of cocaine and anesthesia may have negative outcomes, up to and including death. If you test positive for cocaine, your surgery will be cancelled.  On the  morning of surgery brush your teeth with toothpaste and water, you may rinse your mouth with mouthwash if you wish. Do not swallow any toothpaste or mouthwash.  Do not wear jewelry, make-up, hairpins, clips or nail polish.  Do not wear lotions, powders, or perfumes.   Do not shave body from the neck down 48 hours prior to surgery just in case you cut yourself which could leave a site for infection.  Also, freshly shaved skin may become irritated if using the CHG soap.  Contact lenses, hearing aids and dentures may not be worn into surgery.  Do not bring valuables to the hospital. [facility] is not responsible for any missing/lost belongings or valuables.   Use CHG Soap or wipes as directed on instruction sheet.  Notify your doctor if there is any change in your medical condition (cold, fever, infection).  Wear comfortable clothing (specific to your surgery type) to the hospital.  After surgery, you can help prevent lung complications by doing breathing exercises.  Take deep breaths and cough every 1-2 hours. Your doctor may order a device called an Incentive Spirometer to help you take deep breaths. When coughing or sneezing, hold a pillow firmly against your incision with both hands. This is called "splinting." Doing this helps protect your incision. It also decreases belly discomfort.  If you are being admitted to the hospital overnight, leave your suitcase in the car. After surgery it may be brought to your room.  If you are being discharged the day of surgery, you will not be allowed to drive  home. You will need a responsible adult (18 years or older) to drive you home and stay with you that night.   If you are taking public transportation, you will need to have a responsible adult (18 years or older) with you. Please confirm with your physician that it is acceptable to use public transportation.   Please call the [facility] Dept. at [phone] if you have any  questions about these instructions.  Surgery Visitation Policy:  Patients undergoing a surgery or procedure may have one family member or support person with them as long as that person is not COVID-19 positive or experiencing its symptoms.  That person may remain in the waiting area during the procedure.  Inpatient Visitation:    Visiting hours are 7 a.m. to 8 p.m. Inpatients will be allowed two visitors daily. The visitors may change each day during the patient's stay. No visitors under the age of 17. Any visitor under the age of 30 must be accompanied by an adult. The visitor must pass COVID-19 screenings, use hand sanitizer when entering and exiting the patient's room and wear a mask at all times, including in the patient's room. Patients must also wear a mask when staff or their visitor are in the room. Masking is required regardless of vaccination status.

## 2020-11-07 ENCOUNTER — Other Ambulatory Visit: Payer: Self-pay

## 2020-11-07 ENCOUNTER — Other Ambulatory Visit
Admission: RE | Admit: 2020-11-07 | Discharge: 2020-11-07 | Disposition: A | Discharge status: Home / Self Care | Payer: Medicare PPO | Source: Ambulatory Visit | Attending: Orthopedic Surgery | Admitting: Orthopedic Surgery

## 2020-11-07 DIAGNOSIS — Z01812 Encounter for preprocedural laboratory examination: Secondary | ICD-10-CM | POA: Diagnosis present

## 2020-11-07 DIAGNOSIS — Z20822 Contact with and (suspected) exposure to covid-19: Secondary | ICD-10-CM | POA: Diagnosis not present

## 2020-11-07 LAB — SARS CORONAVIRUS 2 (TAT 6-24 HRS): SARS Coronavirus 2: NEGATIVE

## 2020-11-10 ENCOUNTER — Ambulatory Visit: Payer: Self-pay | Admitting: Family Medicine

## 2020-11-11 ENCOUNTER — Other Ambulatory Visit: Payer: Self-pay

## 2020-11-11 ENCOUNTER — Encounter: Payer: Self-pay | Admitting: Orthopedic Surgery

## 2020-11-11 ENCOUNTER — Inpatient Hospital Stay
Admission: RE | Admit: 2020-11-11 | Discharge: 2020-11-15 | DRG: 470 | Disposition: A | Discharge status: Home Health | Payer: Medicare PPO | Attending: Orthopedic Surgery | Admitting: Orthopedic Surgery

## 2020-11-11 ENCOUNTER — Ambulatory Visit: Payer: Medicare PPO | Admitting: Anesthesiology

## 2020-11-11 ENCOUNTER — Ambulatory Visit: Payer: Medicare PPO

## 2020-11-11 ENCOUNTER — Observation Stay: Payer: Medicare PPO

## 2020-11-11 ENCOUNTER — Encounter
Admission: RE | Disposition: A | Discharge status: Home Health | Payer: Self-pay | Source: Home / Self Care | Attending: Orthopedic Surgery

## 2020-11-11 DIAGNOSIS — Z7982 Long term (current) use of aspirin: Secondary | ICD-10-CM

## 2020-11-11 DIAGNOSIS — I10 Essential (primary) hypertension: Secondary | ICD-10-CM | POA: Diagnosis present

## 2020-11-11 DIAGNOSIS — R1110 Vomiting, unspecified: Secondary | ICD-10-CM

## 2020-11-11 DIAGNOSIS — Z79899 Other long term (current) drug therapy: Secondary | ICD-10-CM

## 2020-11-11 DIAGNOSIS — K9189 Other postprocedural complications and disorders of digestive system: Secondary | ICD-10-CM | POA: Diagnosis not present

## 2020-11-11 DIAGNOSIS — M1712 Unilateral primary osteoarthritis, left knee: Principal | ICD-10-CM | POA: Diagnosis present

## 2020-11-11 DIAGNOSIS — K219 Gastro-esophageal reflux disease without esophagitis: Secondary | ICD-10-CM | POA: Diagnosis present

## 2020-11-11 DIAGNOSIS — Z882 Allergy status to sulfonamides status: Secondary | ICD-10-CM

## 2020-11-11 DIAGNOSIS — Z96659 Presence of unspecified artificial knee joint: Secondary | ICD-10-CM

## 2020-11-11 DIAGNOSIS — Z885 Allergy status to narcotic agent status: Secondary | ICD-10-CM

## 2020-11-11 DIAGNOSIS — K567 Ileus, unspecified: Secondary | ICD-10-CM | POA: Diagnosis not present

## 2020-11-11 DIAGNOSIS — E785 Hyperlipidemia, unspecified: Secondary | ICD-10-CM | POA: Diagnosis present

## 2020-11-11 DIAGNOSIS — Z20822 Contact with and (suspected) exposure to covid-19: Secondary | ICD-10-CM | POA: Diagnosis present

## 2020-11-11 DIAGNOSIS — Z888 Allergy status to other drugs, medicaments and biological substances status: Secondary | ICD-10-CM

## 2020-11-11 DIAGNOSIS — Z96652 Presence of left artificial knee joint: Secondary | ICD-10-CM

## 2020-11-11 HISTORY — PX: TOTAL KNEE ARTHROPLASTY: SHX125

## 2020-11-11 LAB — ABO/RH: ABO/RH(D): A POS

## 2020-11-11 SURGERY — ARTHROPLASTY, KNEE, TOTAL
Anesthesia: Spinal | Site: Knee | Laterality: Left

## 2020-11-11 MED ORDER — TRAMADOL HCL 50 MG PO TABS
50.0000 mg | ORAL_TABLET | Freq: Four times a day (QID) | ORAL | Status: DC
  Administered 2020-11-11 – 2020-11-13 (×7): 50 mg via ORAL
  Filled 2020-11-11 (×8): qty 1

## 2020-11-11 MED ORDER — METHOCARBAMOL 1000 MG/10ML IJ SOLN
500.0000 mg | Freq: Four times a day (QID) | INTRAVENOUS | Status: DC | PRN
  Filled 2020-11-11: qty 5

## 2020-11-11 MED ORDER — CHLORHEXIDINE GLUCONATE 0.12 % MT SOLN: 15.0000 mL | Freq: Once | OROMUCOSAL | Status: AC

## 2020-11-11 MED ORDER — ENOXAPARIN SODIUM 40 MG/0.4ML IJ SOSY
40.0000 mg | PREFILLED_SYRINGE | INTRAMUSCULAR | Status: DC
  Administered 2020-11-12 – 2020-11-15 (×4): 40 mg via SUBCUTANEOUS
  Filled 2020-11-11 (×4): qty 0.4

## 2020-11-11 MED ORDER — PROPOFOL 1000 MG/100ML IV EMUL
INTRAVENOUS | Status: AC
  Filled 2020-11-11: qty 100

## 2020-11-11 MED ORDER — FENTANYL CITRATE (PF) 100 MCG/2ML IJ SOLN: 25.0000 ug | INTRAMUSCULAR | Status: DC | PRN

## 2020-11-11 MED ORDER — BUPIVACAINE-EPINEPHRINE (PF) 0.25% -1:200000 IJ SOLN
INTRAMUSCULAR | Status: AC
  Filled 2020-11-11: qty 30

## 2020-11-11 MED ORDER — MORPHINE SULFATE 4 MG/ML IJ SOLN
INTRAMUSCULAR | Status: DC | PRN
  Administered 2020-11-11: 4 mg via INTRAMUSCULAR

## 2020-11-11 MED ORDER — CEFAZOLIN SODIUM-DEXTROSE 2-4 GM/100ML-% IV SOLN
2.0000 g | INTRAVENOUS | Status: AC
  Administered 2020-11-11 (×2): 2 g via INTRAVENOUS

## 2020-11-11 MED ORDER — PROPOFOL 500 MG/50ML IV EMUL
INTRAVENOUS | Status: DC | PRN
  Administered 2020-11-11: 75 ug/kg/min via INTRAVENOUS

## 2020-11-11 MED ORDER — AMLODIPINE BESYLATE 5 MG PO TABS
5.0000 mg | ORAL_TABLET | Freq: Every day | ORAL | Status: DC
  Administered 2020-11-13 – 2020-11-15 (×3): 5 mg via ORAL
  Filled 2020-11-11 (×4): qty 1

## 2020-11-11 MED ORDER — ORAL CARE MOUTH RINSE: 15.0000 mL | Freq: Once | OROMUCOSAL | Status: AC

## 2020-11-11 MED ORDER — OXYCODONE HCL 5 MG PO TABS
5.0000 mg | ORAL_TABLET | ORAL | Status: DC | PRN
  Administered 2020-11-11 – 2020-11-12 (×4): 10 mg via ORAL
  Administered 2020-11-12: 5 mg via ORAL
  Administered 2020-11-12 – 2020-11-13 (×2): 10 mg via ORAL
  Filled 2020-11-11 (×9): qty 2

## 2020-11-11 MED ORDER — LORATADINE 10 MG PO TABS
10.0000 mg | ORAL_TABLET | Freq: Every day | ORAL | Status: DC
  Administered 2020-11-12 – 2020-11-15 (×3): 10 mg via ORAL
  Filled 2020-11-11 (×4): qty 1

## 2020-11-11 MED ORDER — SODIUM CHLORIDE 0.9 % IV SOLN: INTRAVENOUS | Status: DC

## 2020-11-11 MED ORDER — METHOCARBAMOL 500 MG PO TABS
500.0000 mg | ORAL_TABLET | Freq: Four times a day (QID) | ORAL | Status: DC | PRN
  Administered 2020-11-12: 500 mg via ORAL
  Filled 2020-11-11: qty 1

## 2020-11-11 MED ORDER — ONDANSETRON HCL 4 MG/2ML IJ SOLN: 4.0000 mg | Freq: Once | INTRAMUSCULAR | Status: DC | PRN

## 2020-11-11 MED ORDER — PROPOFOL 10 MG/ML IV BOLUS
INTRAVENOUS | Status: AC
  Filled 2020-11-11: qty 20

## 2020-11-11 MED ORDER — BISACODYL 5 MG PO TBEC
10.0000 mg | DELAYED_RELEASE_TABLET | Freq: Every day | ORAL | Status: DC | PRN
  Administered 2020-11-12: 10 mg via ORAL
  Filled 2020-11-11 (×2): qty 2

## 2020-11-11 MED ORDER — BUPIVACAINE HCL (PF) 0.5 % IJ SOLN
INTRAMUSCULAR | Status: DC | PRN
  Administered 2020-11-11: 3 mL via INTRATHECAL

## 2020-11-11 MED ORDER — OXYCODONE HCL 5 MG PO TABS
10.0000 mg | ORAL_TABLET | ORAL | Status: DC | PRN
  Administered 2020-11-12: 10 mg via ORAL

## 2020-11-11 MED ORDER — CEFAZOLIN SODIUM-DEXTROSE 2-4 GM/100ML-% IV SOLN
INTRAVENOUS | Status: AC
  Filled 2020-11-11: qty 100

## 2020-11-11 MED ORDER — SODIUM CHLORIDE FLUSH 0.9 % IV SOLN
INTRAVENOUS | Status: AC
  Filled 2020-11-11: qty 40

## 2020-11-11 MED ORDER — BUPIVACAINE-EPINEPHRINE 0.25% -1:200000 IJ SOLN
INTRAMUSCULAR | Status: DC | PRN
  Administered 2020-11-11: 60 mL

## 2020-11-11 MED ORDER — CLINDAMYCIN PHOSPHATE 600 MG/50ML IV SOLN
INTRAVENOUS | Status: AC
  Filled 2020-11-11: qty 50

## 2020-11-11 MED ORDER — CHLORHEXIDINE GLUCONATE CLOTH 2 % EX PADS: 6.0000 | MEDICATED_PAD | Freq: Once | CUTANEOUS | Status: DC

## 2020-11-11 MED ORDER — EPHEDRINE SULFATE 50 MG/ML IJ SOLN
INTRAMUSCULAR | Status: DC | PRN
  Administered 2020-11-11: 5 mg via INTRAVENOUS

## 2020-11-11 MED ORDER — CEFAZOLIN SODIUM-DEXTROSE 2-4 GM/100ML-% IV SOLN
2.0000 g | Freq: Four times a day (QID) | INTRAVENOUS | Status: AC
  Administered 2020-11-11 (×2): 2 g via INTRAVENOUS
  Filled 2020-11-11 (×2): qty 100

## 2020-11-11 MED ORDER — 0.9 % SODIUM CHLORIDE (POUR BTL) OPTIME
TOPICAL | Status: DC | PRN
  Administered 2020-11-11: 1000 mL

## 2020-11-11 MED ORDER — CLINDAMYCIN PHOSPHATE 900 MG/50ML IV SOLN
INTRAVENOUS | Status: AC
  Administered 2020-11-11: 900 mg
  Filled 2020-11-11: qty 50

## 2020-11-11 MED ORDER — PANTOPRAZOLE SODIUM 40 MG PO TBEC
40.0000 mg | DELAYED_RELEASE_TABLET | Freq: Every day | ORAL | Status: DC
  Administered 2020-11-12 – 2020-11-15 (×4): 40 mg via ORAL
  Filled 2020-11-11 (×4): qty 1

## 2020-11-11 MED ORDER — NEOMYCIN-POLYMYXIN B GU 40-200000 IR SOLN
Status: AC
  Filled 2020-11-11: qty 20

## 2020-11-11 MED ORDER — NEOMYCIN-POLYMYXIN B GU 40-200000 IR SOLN
Status: DC | PRN
  Administered 2020-11-11: 12 mL
  Administered 2020-11-11: 4 mL

## 2020-11-11 MED ORDER — TRANEXAMIC ACID 1000 MG/10ML IV SOLN
INTRAVENOUS | Status: AC
  Filled 2020-11-11: qty 10

## 2020-11-11 MED ORDER — DEXAMETHASONE SODIUM PHOSPHATE 10 MG/ML IJ SOLN
INTRAMUSCULAR | Status: DC | PRN
  Administered 2020-11-11: 10 mg via INTRAVENOUS

## 2020-11-11 MED ORDER — ACETAMINOPHEN 500 MG PO TABS
ORAL_TABLET | ORAL | Status: AC
  Administered 2020-11-11: 1000 mg via ORAL
  Filled 2020-11-11: qty 2

## 2020-11-11 MED ORDER — CHLORHEXIDINE GLUCONATE 0.12 % MT SOLN
OROMUCOSAL | Status: AC
  Administered 2020-11-11: 15 mL via OROMUCOSAL
  Filled 2020-11-11: qty 15

## 2020-11-11 MED ORDER — DIPHENHYDRAMINE HCL 12.5 MG/5ML PO ELIX: 12.5000 mg | ORAL_SOLUTION | ORAL | Status: DC | PRN

## 2020-11-11 MED ORDER — ACETAMINOPHEN 500 MG PO TABS
1000.0000 mg | ORAL_TABLET | Freq: Four times a day (QID) | ORAL | Status: AC
  Administered 2020-11-11 – 2020-11-12 (×3): 1000 mg via ORAL
  Filled 2020-11-11 (×3): qty 2

## 2020-11-11 MED ORDER — TRANEXAMIC ACID-NACL 1000-0.7 MG/100ML-% IV SOLN
INTRAVENOUS | Status: AC
  Filled 2020-11-11: qty 100

## 2020-11-11 MED ORDER — SODIUM CHLORIDE 0.9 % IV SOLN
INTRAVENOUS | Status: DC | PRN
  Administered 2020-11-11: 60 mL

## 2020-11-11 MED ORDER — GLYCOPYRROLATE 0.2 MG/ML IJ SOLN
INTRAMUSCULAR | Status: DC | PRN
  Administered 2020-11-11: .2 mg via INTRAVENOUS

## 2020-11-11 MED ORDER — TRANEXAMIC ACID-NACL 1000-0.7 MG/100ML-% IV SOLN
1000.0000 mg | INTRAVENOUS | Status: AC
  Administered 2020-11-11: 1000 mg via INTRAVENOUS

## 2020-11-11 MED ORDER — SENNOSIDES-DOCUSATE SODIUM 8.6-50 MG PO TABS
1.0000 | ORAL_TABLET | Freq: Every evening | ORAL | Status: DC | PRN
  Filled 2020-11-11: qty 1

## 2020-11-11 MED ORDER — HYDROMORPHONE HCL 1 MG/ML IJ SOLN: 0.5000 mg | INTRAMUSCULAR | Status: DC | PRN

## 2020-11-11 MED ORDER — ONDANSETRON HCL 4 MG/2ML IJ SOLN
4.0000 mg | Freq: Four times a day (QID) | INTRAMUSCULAR | Status: DC | PRN
  Administered 2020-11-13: 4 mg via INTRAVENOUS
  Filled 2020-11-11: qty 2

## 2020-11-11 MED ORDER — ONDANSETRON HCL 4 MG/2ML IJ SOLN
INTRAMUSCULAR | Status: DC | PRN
  Administered 2020-11-11: 4 mg via INTRAVENOUS

## 2020-11-11 MED ORDER — ONDANSETRON HCL 4 MG PO TABS
4.0000 mg | ORAL_TABLET | Freq: Four times a day (QID) | ORAL | Status: DC | PRN
  Administered 2020-11-12 – 2020-11-13 (×3): 4 mg via ORAL
  Filled 2020-11-11 (×3): qty 1

## 2020-11-11 MED ORDER — ACETAMINOPHEN 325 MG PO TABS: 325.0000 mg | ORAL_TABLET | Freq: Four times a day (QID) | ORAL | Status: DC | PRN

## 2020-11-11 MED ORDER — LISINOPRIL 20 MG PO TABS
40.0000 mg | ORAL_TABLET | Freq: Every day | ORAL | Status: DC
  Administered 2020-11-13 – 2020-11-15 (×3): 40 mg via ORAL
  Filled 2020-11-11 (×4): qty 2

## 2020-11-11 MED ORDER — FENTANYL CITRATE (PF) 100 MCG/2ML IJ SOLN
INTRAMUSCULAR | Status: AC
  Filled 2020-11-11: qty 2

## 2020-11-11 MED ORDER — ASPIRIN EC 81 MG PO TBEC
81.0000 mg | DELAYED_RELEASE_TABLET | Freq: Every day | ORAL | Status: DC
  Administered 2020-11-12 – 2020-11-15 (×4): 81 mg via ORAL
  Filled 2020-11-11 (×4): qty 1

## 2020-11-11 MED ORDER — DOCUSATE SODIUM 100 MG PO CAPS
100.0000 mg | ORAL_CAPSULE | Freq: Two times a day (BID) | ORAL | Status: DC
  Administered 2020-11-11 – 2020-11-15 (×8): 100 mg via ORAL
  Filled 2020-11-11 (×8): qty 1

## 2020-11-11 MED ORDER — VITAMIN D3 25 MCG (1000 UNIT) PO TABS
2000.0000 [IU] | ORAL_TABLET | Freq: Every day | ORAL | Status: DC
  Administered 2020-11-12 – 2020-11-15 (×4): 2000 [IU] via ORAL
  Filled 2020-11-11 (×8): qty 2

## 2020-11-11 MED ORDER — MIDAZOLAM HCL 2 MG/2ML IJ SOLN
INTRAMUSCULAR | Status: AC
  Filled 2020-11-11: qty 2

## 2020-11-11 MED ORDER — KETOROLAC TROMETHAMINE 30 MG/ML IJ SOLN
INTRAMUSCULAR | Status: DC | PRN
  Administered 2020-11-11: 30 mg via INTRAMUSCULAR

## 2020-11-11 MED ORDER — MORPHINE SULFATE (PF) 4 MG/ML IV SOLN
INTRAVENOUS | Status: AC
  Filled 2020-11-11: qty 1

## 2020-11-11 MED ORDER — SODIUM CHLORIDE 0.9 % IV SOLN
INTRAVENOUS | Status: DC | PRN
  Administered 2020-11-11: 15 ug/min via INTRAVENOUS

## 2020-11-11 MED ORDER — FENTANYL CITRATE (PF) 100 MCG/2ML IJ SOLN
INTRAMUSCULAR | Status: DC | PRN
  Administered 2020-11-11 (×2): 25 ug via INTRAVENOUS
  Administered 2020-11-11: 50 ug via INTRAVENOUS

## 2020-11-11 MED ORDER — ACETAMINOPHEN 500 MG PO TABS: 1000.0000 mg | ORAL_TABLET | ORAL | Status: AC

## 2020-11-11 MED ORDER — MIDAZOLAM HCL 5 MG/5ML IJ SOLN
INTRAMUSCULAR | Status: DC | PRN
  Administered 2020-11-11 (×2): 1 mg via INTRAVENOUS

## 2020-11-11 MED ORDER — CLOTRIMAZOLE 1 % EX CREA
TOPICAL_CREAM | Freq: Two times a day (BID) | CUTANEOUS | Status: DC
  Filled 2020-11-11: qty 15

## 2020-11-11 MED ORDER — LACTATED RINGERS IV SOLN: INTRAVENOUS | Status: DC

## 2020-11-11 MED ORDER — PHENYLEPHRINE HCL (PRESSORS) 10 MG/ML IV SOLN
INTRAVENOUS | Status: AC
  Filled 2020-11-11: qty 1

## 2020-11-11 MED ORDER — CLINDAMYCIN PHOSPHATE 600 MG/50ML IV SOLN
600.0000 mg | Freq: Once | INTRAVENOUS | Status: AC
  Administered 2020-11-11: 600 mg via INTRAVENOUS

## 2020-11-11 MED ORDER — TRANEXAMIC ACID-NACL 1000-0.7 MG/100ML-% IV SOLN: INTRAVENOUS | Status: DC | PRN

## 2020-11-11 MED ORDER — CHLORHEXIDINE GLUCONATE CLOTH 2 % EX PADS
6.0000 | MEDICATED_PAD | Freq: Once | CUTANEOUS | Status: AC
  Administered 2020-11-11: 6 via TOPICAL

## 2020-11-11 SURGICAL SUPPLY — 61 items
BLADE SAW 90X13X1.19 OSCILLAT (BLADE) ×2 IMPLANT
BLADE SAW 90X25X1.19 OSCILLAT (BLADE) ×2 IMPLANT
CEMENT HV SMART SET (Cement) ×4 IMPLANT
CEMENT TIBIA MBT SIZE 5 (Knees) ×1 IMPLANT
CNTNR SPEC 2.5X3XGRAD LEK (MISCELLANEOUS) ×1
CONT SPEC 4OZ STER OR WHT (MISCELLANEOUS) ×1
CONTAINER SPEC 2.5X3XGRAD LEK (MISCELLANEOUS) ×1 IMPLANT
COOLER POLAR GLACIER W/PUMP (MISCELLANEOUS) ×2 IMPLANT
COVER WAND RF STERILE (DRAPES) ×2 IMPLANT
CUFF TOURN SGL QUICK 34 (TOURNIQUET CUFF) ×1
CUFF TRNQT CYL 34X4.125X (TOURNIQUET CUFF) ×1 IMPLANT
DRAPE 3/4 80X56 (DRAPES) ×4 IMPLANT
DRAPE IMP U-DRAPE 54X76 (DRAPES) ×4 IMPLANT
DRAPE INCISE IOBAN 66X60 STRL (DRAPES) ×2 IMPLANT
DRAPE SURG 17X11 SM STRL (DRAPES) ×4 IMPLANT
DRSG AQUACEL AG ADV 3.5X10 (GAUZE/BANDAGES/DRESSINGS) ×2 IMPLANT
DURAPREP 26ML APPLICATOR (WOUND CARE) ×12 IMPLANT
ELECT REM PT RETURN 9FT ADLT (ELECTROSURGICAL) ×2
ELECTRODE REM PT RTRN 9FT ADLT (ELECTROSURGICAL) ×1 IMPLANT
FEMUR SIGMA PS SZ 5.0 L (Femur) ×2 IMPLANT
GAUZE 4X4 16PLY ~~LOC~~+RFID DBL (SPONGE) ×2 IMPLANT
GAUZE SPONGE 4X4 12PLY STRL (GAUZE/BANDAGES/DRESSINGS) ×2 IMPLANT
GLOVE SURG ENC TEXT LTX SZ7.5 (GLOVE) ×28 IMPLANT
GOWN STRL REUS TWL 2XL XL LVL4 (GOWN DISPOSABLE) ×2 IMPLANT
GOWN STRL REUS W/ TWL LRG LVL3 (GOWN DISPOSABLE) ×1 IMPLANT
GOWN STRL REUS W/ TWL LRG LVL4 (GOWN DISPOSABLE) ×1 IMPLANT
GOWN STRL REUS W/ TWL XL LVL3 (GOWN DISPOSABLE) ×1 IMPLANT
GOWN STRL REUS W/TWL LRG LVL3 (GOWN DISPOSABLE) ×1
GOWN STRL REUS W/TWL LRG LVL4 (GOWN DISPOSABLE) ×1
GOWN STRL REUS W/TWL XL LVL3 (GOWN DISPOSABLE) ×1
HOLDER FOLEY CATH W/STRAP (MISCELLANEOUS) ×2 IMPLANT
IMMBOLIZER KNEE 19 BLUE UNIV (SOFTGOODS) ×2 IMPLANT
IV NS IRRIG 3000ML ARTHROMATIC (IV SOLUTION) ×2 IMPLANT
KIT TURNOVER KIT A (KITS) ×2 IMPLANT
MANIFOLD NEPTUNE II (INSTRUMENTS) ×4 IMPLANT
NDL SAFETY ECLIPSE 18X1.5 (NEEDLE) ×1 IMPLANT
NEEDLE HYPO 18GX1.5 SHARP (NEEDLE) ×1
NEEDLE HYPO 22GX1.5 SAFETY (NEEDLE) ×2 IMPLANT
NEEDLE SPNL 20GX3.5 QUINCKE YW (NEEDLE) ×2 IMPLANT
NS IRRIG 1000ML POUR BTL (IV SOLUTION) ×2 IMPLANT
PACK TOTAL KNEE (MISCELLANEOUS) ×2 IMPLANT
PAD WRAPON POLAR KNEE (MISCELLANEOUS) ×1 IMPLANT
PATELLA DOME PFC 38MM (Knees) ×2 IMPLANT
PENCIL SMOKE EVACUATOR COATED (MISCELLANEOUS) ×2 IMPLANT
PLATE ROT INSERT 10MM SIZE 5 (Plate) ×2 IMPLANT
PULSAVAC PLUS IRRIG FAN TIP (DISPOSABLE) ×2
SPONGE T-LAP 18X18 ~~LOC~~+RFID (SPONGE) ×4 IMPLANT
STAPLER SKIN PROX 35W (STAPLE) ×2 IMPLANT
SUCTION FRAZIER HANDLE 10FR (MISCELLANEOUS) ×1
SUCTION TUBE FRAZIER 10FR DISP (MISCELLANEOUS) ×1 IMPLANT
SUT ETHIBOND NAB CT1 #1 30IN (SUTURE) ×4 IMPLANT
SUT VIC AB 0 CT1 36 (SUTURE) ×2 IMPLANT
SUT VIC AB 2-0 CT1 (SUTURE) ×4 IMPLANT
SYR 20ML LL LF (SYRINGE) ×2 IMPLANT
SYR 30ML LL (SYRINGE) ×4 IMPLANT
TIBIA MBT CEMENT SIZE 5 (Knees) ×2 IMPLANT
TIP FAN IRRIG PULSAVAC PLUS (DISPOSABLE) ×1 IMPLANT
TOWER CARTRIDGE SMART MIX (DISPOSABLE) ×2 IMPLANT
TRAY FOLEY MTR SLVR 16FR STAT (SET/KITS/TRAYS/PACK) ×2 IMPLANT
TUBE SUCT KAM VAC (TUBING) ×2 IMPLANT
WRAPON POLAR PAD KNEE (MISCELLANEOUS) ×2

## 2020-11-11 NOTE — Transfer of Care (Signed)
Immediate Anesthesia Transfer of Care Note  Patient: Henry [name]  Procedure(s) Performed: LEFT TOTAL KNEE ARTHROPLASTY (Left: Knee)  Patient Location: PACU  Anesthesia Type:Spinal  Level of Consciousness: awake, drowsy and patient cooperative  Airway & Oxygen Therapy: Patient Spontanous Breathing and Patient connected to face mask oxygen  Post-op Assessment: Report given to RN and Post -op Vital signs reviewed and stable  Post vital signs: Reviewed and stable  Last Vitals:  Vitals Value Taken Time  BP 118/63 11/11/20 1111  Temp    Pulse 88 11/11/20 1113  Resp 23 11/11/20 1113  SpO2 96 % 11/11/20 1113  Vitals shown include unvalidated device data.  Last Pain:  Vitals:   11/11/20 0636  TempSrc: Temporal  PainSc: 0-No pain         Complications: No notable events documented.

## 2020-11-11 NOTE — Anesthesia Preprocedure Evaluation (Signed)
Anesthesia Evaluation  Patient identified by MRN, date of birth, ID band Patient awake    Reviewed: Allergy & Precautions, NPO status , Patient's Chart, lab work & pertinent test results  History of Anesthesia Complications Negative for: history of anesthetic complications  Airway Mallampati: II  TM Distance: >3 FB Neck ROM: Full    Dental no notable dental hx.    Pulmonary neg pulmonary ROS, neg sleep apnea, neg COPD,    breath sounds clear to auscultation- rhonchi (-) wheezing      Cardiovascular Exercise Tolerance: Good hypertension, Pt. on medications (-) CAD, (-) Past MI, (-) Cardiac Stents and (-) CABG  Rhythm:Regular Rate:Normal - Systolic murmurs and - Diastolic murmurs    Neuro/Psych neg Seizures negative neurological ROS  negative psych ROS   GI/Hepatic Neg liver ROS, GERD  ,  Endo/Other  negative endocrine ROSneg diabetes  Renal/GU negative Renal ROS     Musculoskeletal  (+) Arthritis ,   Abdominal (+) - obese,   Peds  Hematology negative hematology ROS (+)   Anesthesia Other Findings Past Medical History: No date: Allergy No date: Arthritis No date: Cancer [facility])     Comment:  left axillary No date: Cataract No date: GERD (gastroesophageal reflux disease) No date: Hyperlipidemia No date: Hypertension No date: Pneumonia   Reproductive/Obstetrics                             Lab Results  Component Value Date   WBC 6.2 11/03/2020   HGB 14.1 11/03/2020   HCT 41.4 11/03/2020   MCV 91.2 11/03/2020   PLT 231 11/03/2020    Anesthesia Physical Anesthesia Plan  ASA: 2  Anesthesia Plan: Spinal   Post-op Pain Management:    Induction:   PONV Risk Score and Plan: 1 and Propofol infusion  Airway Management Planned: Natural Airway  Additional Equipment:   Intra-op Plan:   Post-operative Plan:   Informed Consent: I have reviewed the patients History and Physical,  chart, labs and discussed the procedure including the risks, benefits and alternatives for the proposed anesthesia with the patient or authorized representative who has indicated his/her understanding and acceptance.     Dental advisory given  Plan Discussed with: CRNA and Anesthesiologist  Anesthesia Plan Comments:         Anesthesia Quick Evaluation

## 2020-11-11 NOTE — Evaluation (Signed)
Physical Therapy Evaluation Patient Details Name: Henry [name] MRN: [id number] DOB: 12/31/1945 Today's Date: 11/11/2020   History of Present Illness  Pt admitted for L TKR. History includes GERD, HLD, and HTN. Pt is POD 0 at time of evaluation.  Clinical Impression  Pt is a pleasant 75 year old male who was admitted for L TKR. Pt performs bed mobility, transfers, and ambulation with cga and RW. KI donned, however removed for all mobility. Good AAROM. Pt demonstrates deficits with strength/mobility/pain. Appears motivated to participate. Would benefit from skilled PT to address above deficits and promote optimal return to PLOF. Recommend transition to [place] upon discharge from acute hospitalization.     Follow Up Recommendations Home health PT    Equipment Recommendations  Rolling walker with 5" wheels    Recommendations for Other Services       Precautions / Restrictions Precautions Precautions: Fall;Knee Precaution Booklet Issued: No Restrictions Weight Bearing Restrictions: Yes LLE Weight Bearing: Weight bearing as tolerated      Mobility  Bed Mobility Overal bed mobility: Needs Assistance Bed Mobility: Supine to Sit     Supine to sit: Min guard     General bed mobility comments: follows commands well and able to sit at EOB. Slightly dizzy, however improves quickly    Transfers Overall transfer level: Needs assistance Equipment used: Rolling walker (2 wheeled) Transfers: Sit to/from Stand Sit to Stand: Min guard         General transfer comment: safe technique. Needs bed elevated prior to transfer. RW used  Ambulation/Gait Ambulation/Gait assistance: Min Conservator, museum/gallery (Feet): 5 Feet Assistive device: Rolling walker (2 wheeled) Gait Pattern/deviations: Step-to pattern     General Gait Details: became slightly dizzy and nausea with exertion. Step to gait pattern with safe technique.  Stairs            Wheelchair Mobility    Modified  Rankin (Stroke Patients Only)       Balance Overall balance assessment: Needs assistance Sitting-balance support: Feet supported Sitting balance-Leahy Scale: Good     Standing balance support: Bilateral upper extremity supported Standing balance-Leahy Scale: Good                               Pertinent Vitals/Pain Pain Assessment: Faces Faces Pain Scale: Hurts a little bit Pain Location: L knee Pain Descriptors / Indicators: Operative site guarding Pain Intervention(s): Limited activity within patient's tolerance;Ice applied;Premedicated before session    [place] expects to be discharged to:: Private residence Living Arrangements: Spouse/significant other Available Help at Discharge: Family;Available 24 hours/day Type of Home: House Home Access: Stairs to enter Entrance Stairs-Rails: None Entrance Stairs-Number of Steps: 2 Home Layout: One level Home Equipment: None Additional Comments: does have elevated toilet with railing    Prior Function Level of Independence: Independent         Comments: no falls, indep with all mobility     Hand Dominance        Extremity/Trunk Assessment   Upper Extremity Assessment Upper Extremity Assessment: Overall WFL for tasks assessed    Lower Extremity Assessment Lower Extremity Assessment: Generalized weakness (L LE grossly 3/5; R LE grossly 5/5)       Communication   Communication: No difficulties  Cognition Arousal/Alertness: Awake/alert Behavior During Therapy: WFL for tasks assessed/performed Overall Cognitive Status: Within Functional Limits for tasks assessed  General Comments      Exercises Total Joint Exercises Goniometric ROM: L knee AAROM: 8-70 degrees Other Exercises Other Exercises: supine ther-ex performed on L LE including AP, quad sets, SLRs, and hip ab/add. 10 reps with safe technique   Assessment/Plan    PT  Assessment Patient needs continued PT services  PT Problem List Decreased strength;Decreased range of motion;Decreased mobility;Pain       PT Treatment Interventions DME instruction;Gait training;Stair training;Therapeutic exercise    PT Goals (Current goals can be found in the Care Plan section)  Acute Rehab PT Goals Patient Stated Goal: to go home PT Goal Formulation: With patient Time For Goal Achievement: 11/25/20 Potential to Achieve Goals: Good    Frequency BID   Barriers to discharge        Co-evaluation               AM-PAC PT "6 Clicks" Mobility  Outcome Measure Help needed turning from your back to your side while in a flat bed without using bedrails?: A Little Help needed moving from lying on your back to sitting on the side of a flat bed without using bedrails?: A Little Help needed moving to and from a bed to a chair (including a wheelchair)?: A Little Help needed standing up from a chair using your arms (e.g., wheelchair or bedside chair)?: A Little Help needed to walk in hospital room?: A Little Help needed climbing 3-5 steps with a railing? : None 6 Click Score: 19    End of Session Equipment Utilized During Treatment: Gait belt Activity Tolerance: Patient tolerated treatment well Patient left: in chair;with chair alarm set;with SCD's reapplied Nurse Communication: Mobility status PT Visit Diagnosis: Muscle weakness (generalized) (M62.81);Difficulty in walking, not elsewhere classified (R26.2);Pain Pain - Right/Left: Left Pain - part of body: Knee    Time: 9604-5409 PT Time Calculation (min) (ACUTE ONLY): 31 min   Charges:   PT Evaluation $PT Eval Low Complexity: 1 Low PT Treatments $Therapeutic Exercise: 8-22 mins        Greggory Stallion, PT, DPT [phone]   Earlyn S[place] 11/11/2020, 4:49 PM

## 2020-11-11 NOTE — Op Note (Signed)
DATE OF SURGERY:  11/11/2020 TIME: 11:37 AM  PATIENT NAME:  Henry [name]   AGE: 75 y.o.    PRE-OPERATIVE DIAGNOSIS:  Left Knee Osteoarthritis  POST-OPERATIVE DIAGNOSIS:  Same  PROCEDURE:  Procedure(s): LEFT TOTAL KNEE ARTHROPLASTY  SURGEON:  Thornton Park, MD   ASSISTANT:  Roland Rack, PA  OPERATIVE IMPLANTS: Depuy PFC Sigma, Posterior Stabilized Femural component size 5, Tibia size rotating platform component size 5, Patella polyethylene 3-peg oval button size 38, with a 10 mm polyethylene insert.  EBL:  50 cc  TOURNIQUET TIME:  130 minutes  PREOPERATIVE INDICATIONS:  Henry [name] is an 75 y.o. male who has a diagnosis of  Left Knee Osteoarthritis and elected for a left total knee arthroplasty after failing nonoperative treatment.  Their knee pain significantly impacts their activity of daily living.  Radiographs have demonstrated tricompartmental osteoarthritis joint space narrowing, osteophytes, and subchondral sclerosis.  The risks, benefits, and alternatives were discussed at length including but not limited to the risks of infection, bleeding, nerve or blood vessel injury, knee stiffness, fracture, dislocation, loosening or failure of the hardware and the need for further surgery. Medical risks include but not limited to DVT and pulmonary embolism, myocardial infarction, stroke, pneumonia, respiratory failure and death. I discussed these risks with the patient in my office prior to the date of surgery. They understood these risks and were willing to proceed.  OPERATIVE FINDINGS AND UNIQUE ASPECTS OF THE CASE:  Tricompartmental osteoarthritis  OPERATIVE DESCRIPTION:  The patient was brought to the operative room and placed in a supine position after undergoing placement of a spinal anesthetic.  A Foley catheter was placed.  IV antibiotics were given. Patient received ancef and clindamycin IV for prophylactic antibiotics.  Patient also received IV tranexamic acid prior to  inflation of the tourniquet.  The left lower extremity was prepped and draped in the usual sterile fashion.  A time out was performed to verify the patient's name, date of birth, medical record number, correct site of surgery and correct procedure to be performed. The timeout was also used to confirm the patient received antibiotics and that appropriate instruments, implants and radiographs studies were available in the room.  The leg was elevated and exsanguinated with an Esmarch and the tourniquet was inflated to 275 mmHg for 130 minutes..  A midline incision was made over the left knee. Full-thickness skin flaps were developed. A medial parapatellar arthrotomy was then made and the patella everted and the knee was brought into 90 of flexion. Hoffa's fat pad along with the cruciate ligaments and medial and lateral menisci were resected.   The distal femoral intramedullary canal was opened with a drill and the intramedullary distal femoral cutting jig was inserted into the femoral canal pinned into position. It was set at 5 degrees resecting 10 mm off the distal femur.  Care was taken to protect the collateral ligaments during distal femoral resection.  The distal femoral resection was performed with an oscillating saw. The femoral cutting guide was then removed.  The extramedullary tibial cutting guide was then placed using the anterior tibial crest and second ray of the foot as a references.  The tibial cutting guide was adjusted to allow for appropriate posterior slope.  The tibial cutting block was pinned into position. The slotted stylus was used to measure the proximal tibial resection of 10 mm off the high lateral side.  The tibial long rod alignment guide was then used to confirm position of the cutting block.  A third cross pin through the tibial cutting block was then drilled into position to allow for rotational stability. Care was taken during the tibial resection to protect the medial and  collateral ligaments.  The resected tibial bone was removed along with the posterior horns of the menisci.  The PCL was sacrificed.  Extension gap was measured with a spacer block and alignment and extension was confirmed using a long alignment rod.  The attention was then turned back to the femur. The posterior referencing distal femoral sizing guide was applied to the distal femur.  The femur was sized to be a size 5. Rotation of the referencing guide was checked with the epicondylar axis and Whitesides line. Then the 4-in-1 cutting jig was then applied to the distal femur. A stylus was used to confirm that the anterior femur would not be notched.   Then the anterior, posterior and chamfer femoral cuts were then made with an oscillating saw.  The flexion gap was then measured with a flexion spacer block and long alignment rod and was found to be symmetric with the extension gap and perpendicular to mechanical axis of the tibia.  The distal femoral preparation was completed by performing the posterior stabilized box cut using the cutting block. The entry site for the intramedullary femoral guide was filled with autologous bone graft from bone previously resected earlier in the case.  The proximal tibia plateau was then sized with trial trays. The best coverage was achieved with a size 5. This tibial tray was then pinned into position. The proximal tibia was then prepared with the reamer and keel punch.  After tibial preparation was completed, all trial components were inserted with polyethylene trials.  The knee was found to have excellent balance and full motion with a size 10 mm tibial polyethylene insert..    The attention was then turned to preparation of the patella. The thickness of the patella was measured with a caliper, the diameter measured with the patella templates.  The patella resection was then made with an oscillating saw using the patella cutting guide.  3 peg holes for the patella  component were then drilled. The trial size 38 patella was then placed. Knee was taken through a full range of motion and deemed to be stable with the trial components. All trial components were then removed. The knee capsule was then injected with Exparel.  The knee joint capsule was injected with a mixture of quarter percent Marcaine, Toradol and morphine to assist with postoperative pain relief.  The joint was copiously irrigated with pulse lavage.  The final total knee arthroplasty components were then cemented into place with a 10 mm trial polyethylene insert and all excess methylmethacrylate was removed.  The joint was again copiously irrigated. After the cement had hardened the knee was again taken through a full range of motion. It was felt to be most stable with the 10 mm tibial polyethylene insert. The actual tibial polyethylene insert was then placed.   The knee was taken through a range of motion and the patella tracked well and the knee was again irrigated copiously.    The medial arthrotomy was closed with #1 Ethibond. The subcutaneous tissue closed with 0 and 2-0 vicryl, and skin approximated with staples.  A dry sterile and compressive dressing was applied.  A Polar Care was applied to the operative knee along with a knee immobilizer.  The patient was awakened and brought to the PACU in stable and satisfactory condition.  All sharp, lap and instrument counts were correct at the conclusion the case. I spoke with the patient's wife in the postop consultation room to let her know the case had been performed without complication and the patient was stable in recovery room.

## 2020-11-11 NOTE — Anesthesia Procedure Notes (Signed)
Spinal  Patient location during procedure: OR Start time: 11/11/2020 8:00 AM End time: 11/11/2020 8:04 AM Reason for block: surgical anesthesia Staffing Performed: anesthesiologist  Anesthesiologist: Emmie Niemann, MD Preanesthetic Checklist Completed: patient identified, IV checked, site marked, risks and benefits discussed, surgical consent, monitors and equipment checked, pre-op evaluation and timeout performed Spinal Block Patient position: sitting Prep: ChloraPrep Patient monitoring: heart rate, continuous pulse ox and blood pressure Approach: midline Location: L4-5 Injection technique: single-shot Needle Needle type: Introducer and Pencil-Tip  Needle gauge: 24 G Needle length: 9 cm Assessment Events: CSF return Additional Notes Negative paresthesia. Negative blood return. Positive free-flowing CSF. Expiration date of kit checked and confirmed. Patient tolerated procedure well, without complications.

## 2020-11-11 NOTE — Anesthesia Procedure Notes (Signed)
Procedure Name: General with mask airway Date/Time: 11/11/2020 8:26 AM Performed by: Kelton Pillar, CRNA Pre-anesthesia Checklist: Patient identified, Emergency Drugs available, Suction available and Patient being monitored Patient Re-evaluated:Patient Re-evaluated prior to induction Oxygen Delivery Method: Simple face mask Induction Type: IV induction Placement Confirmation: positive ETCO2, CO2 detector and breath sounds checked- equal and bilateral Dental Injury: Teeth and Oropharynx as per pre-operative assessment

## 2020-11-11 NOTE — H&P (Signed)
PREOPERATIVE H&P  Chief Complaint: Left Knee Osteoarthritis  HPI: Henry [name] is a 75 y.o. male who presents for preoperative history and physical with a diagnosis of Left Knee Osteoarthritis. Symptoms of pain, swelling and limited ROM are significantly impairing activities of daily living.  He has agreed with surgical management.   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   Cataract    GERD (gastroesophageal reflux disease)    Hyperlipidemia    Hypertension    Pneumonia    Past Surgical History:  Procedure Laterality Date   CATARACT EXTRACTION Bilateral    COLONOSCOPY     MOLE REMOVAL     NECK MASS EXCISION     PORTACATH PLACEMENT N/A 12/29/2016   Procedure: INSERTION PORT-A-CATH;  Surgeon: Christene Lye, MD;  Location: [facility] ORS;  Service: General;  Laterality: N/A;   TONSILLECTOMY     Social History   Socioeconomic History   Marital status: Married    Spouse name: Not on file   Number of children: 2   Years of education: Not on file   Highest education level: Some college, no degree  Occupational History   Occupation: retired  Tobacco Use   Smoking status: Never   Smokeless tobacco: Never  Vaping Use   Vaping Use: Never used  Substance and Sexual Activity   Alcohol use: Yes    Alcohol/week: 8.0 standard drinks    Types: 8 Cans of beer per week    Comment: 2 beers four days a week   Drug use: No   Sexual activity: Not on file  Other Topics Concern   Not on file  Social History Narrative   Not on file   Social Determinants of Health   Financial Resource Strain: Not on file  Food Insecurity: Not on file  Transportation Needs: Not on file  Physical Activity: Not on file  Stress: Not on file  Social Connections: Not on file   Family History  Problem Relation Age of Onset   Heart disease Mother    Rheumatic fever Mother    CAD Mother    Heart attack Father    COPD Sister    Allergies   Allergen Reactions   Codeine Other (See Comments)    Gi upset   Lovastatin Other (See Comments)    Joint pain   Pravastatin Nausea And Vomiting    joint ache   Pravastatin Sodium     joint ache   Simvastatin Other (See Comments)    muscle ache    Statins    Sulfa Antibiotics Rash and Itching   Prior to Admission medications   Medication Sig Start Date End Date Taking? Authorizing Provider  amLODipine (NORVASC) 5 MG tablet TAKE ONE TABLET BY MOUTH DAILY Patient taking differently: Take 5 mg by mouth daily. 02/23/20  Yes Jerrol Banana., MD  aspirin EC 81 MG tablet Take 81 mg by mouth daily.   Yes [provider]  cetirizine (ZYRTEC) 10 MG tablet Take 10 mg by mouth daily.   Yes [provider]  Cholecalciferol (VITAMIN D) 50 MCG (2000 UT) CAPS Take 2,000 Units by mouth daily. 10/02/12  Yes [provider]  clotrimazole-betamethasone (LOTRISONE) cream Apply 1 application topically daily as needed (Rash).   Yes [provider]  docusate sodium (COLACE) 50 MG capsule Take 50 mg by mouth daily.   Yes [provider]  ibuprofen (ADVIL,MOTRIN) 200 MG tablet Take 800 mg by mouth every 8 (eight) hours as needed (for pain).   Yes [provider]  lisinopril (ZESTRIL) 40 MG tablet TAKE ONE TABLET BY MOUTH DAILY Patient taking differently: Take 40 mg by mouth daily. 02/23/20  Yes Jerrol Banana., MD  omeprazole (PRILOSEC) 20 MG capsule TAKE ONE CAPSULE BY MOUTH DAILY Patient taking differently: Take 20 mg by mouth daily. 02/23/20  Yes Jerrol Banana., MD  phenylephrine-shark liver oil-mineral oil-petrolatum (PREPARATION H) 0.25-14-74.9 % rectal ointment Place 1 application rectally 2 (two) times daily as needed for hemorrhoids.   Yes [provider]  TURMERIC PO Take 2,000 mg by mouth daily.   Yes [provider]  ZINC GLUCONATE PO Take by mouth daily. Immune Health   Yes [provider]      Positive ROS: All other systems have been reviewed and were otherwise negative with the exception of those mentioned in the HPI and as above.  Physical Exam: General: Alert, no acute distress Cardiovascular: Regular rate and rhythm, no murmurs rubs or gallops.  No pedal edema Respiratory: Clear to auscultation bilaterally, no wheezes rales or rhonchi. No cyanosis, no use of accessory musculature GI: No organomegaly, abdomen is soft and non-tender nondistended with positive bowel sounds. Skin: Skin intact, no lesions within the operative field. Neurologic: Sensation intact distally Psychiatric: Patient is competent for consent with normal mood and affect Lymphatic: No cervical lymphadenopathy  MUSCULOSKELETAL: Left Knee: The patient's skin is intact. There is no erythema or ecchymosis, but he has a mild effusion. His range of motion is from 0-120 degrees. He has tenderness over the medial joint line as well as patellofemoral crepitus and grind, but no apprehension. He has no calf tenderness or lower leg edema. He has palpable pedal pulses, intact sensation to light touch and intact motor function distally.  Assessment: Left Knee Osteoarthritis  Plan: Plan for Procedure(s): LEFT TOTAL KNEE ARTHROPLASTY  I reviewed the details of the operation as well as the postoperative course.  I marked the left knee according the hospital's correct site of surgery protocol.  Preop history and physical was performed at the bedside.  I reviewed the patient's labs and radiographs in preparation for this case.  I discussed the risks and benefits of surgery. The risks include but are not limited to infection, bleeding requiring blood transfusion, nerve or blood vessel injury, joint stiffness or loss of motion, persistent pain, weakness or instability, loosening or hardware failure and the need for further surgery. Medical risks include but are not limited to DVT and pulmonary embolism, myocardial  infarction, stroke, pneumonia, respiratory failure and death. Patient understood these risks and wished to proceed.     Thornton Park, MD   11/11/2020 7:49 AM

## 2020-11-11 NOTE — Progress Notes (Signed)
  Subjective:  POST OP CHECK: s/p left total knee arthroplasty.   Patient reports left knee pain as minimal.  Patient's spinal block is still working.  Patient's wife and RN are in the room.  Patient denies any other postop issues at this time.  Objective:   VITALS:   Vitals:   11/11/20 1115 11/11/20 1132 11/11/20 1200 11/11/20 1221  BP: 119/70 126/72 (!) 99/52 107/69  Pulse: 93 88 82 81  Resp: 12 15 11 17   Temp:   (!) 97 F (36.1 C) 98 F (36.7 C)  TempSrc:    Axillary  SpO2: 95% 95% 96% 96%  Weight:      Height:        PHYSICAL EXAM: Left lower extremity: Patient's dressing remains clean dry and intact.  Patient has palpable pedal pulses, but reduced sensation to light touch.  He is unable to dorsiflex and plantarflex his ankle yet at this time due to his spinal block.  His compartments are soft and compressible.   LABS  Results for orders placed or performed during the hospital encounter of 11/11/20 (from the past 24 hour(s))  ABO/Rh     Status: None   Collection Time: 11/11/20  6:38 AM  Result Value Ref Range   ABO/RH(D)      A POS Performed at [facility], [address], [place], [place] [address]     DG Knee Left Port  Result Date: 11/11/2020 CLINICAL DATA:  Status post left knee arthroplasty. EXAM: PORTABLE LEFT KNEE - 1-2 VIEW COMPARISON:  No recent. FINDINGS: Total left knee replacement.  Hardware intact.  Anatomic alignment. IMPRESSION: Total left knee replacement with anatomic alignment. Electronically Signed   By: Marcello Moores  Register   On: 11/11/2020 13:13    Assessment/Plan: Day of Surgery   Active Problems:   S/P TKR (total knee replacement) using cement, left  I reviewed the patient's postoperative x-rays.  His total knee arthroplasty components are well-positioned.  There is no evidence of postop complication including fracture or dislocation.  Continue 24 hours of postop antibiotics.  Patient begin physical therapy this afternoon and continue  tomorrow.  Patient is weightbearing as tolerated on left lower extremity.  Patient is encouraged to perform incentive spirometry every hour.  His Foley catheter will be removed in the morning.  Labs will be checked in the morning as well.    Thornton Park , MD 11/11/2020, 1:23 PM

## 2020-11-12 ENCOUNTER — Encounter: Payer: Self-pay | Admitting: Orthopedic Surgery

## 2020-11-12 LAB — BASIC METABOLIC PANEL
Anion gap: 3 — ABNORMAL LOW (ref 5–15)
BUN: 22 mg/dL (ref 8–23)
CO2: 25 mmol/L (ref 22–32)
Calcium: 8.7 mg/dL — ABNORMAL LOW (ref 8.9–10.3)
Chloride: 108 mmol/L (ref 98–111)
Creatinine, Ser: 1.24 mg/dL (ref 0.61–1.24)
GFR, Estimated: >60 mL/min (ref >60)
Glucose, Bld: 139 mg/dL — ABNORMAL HIGH (ref 70–99)
Potassium: 4.5 mmol/L (ref 3.5–5.1)
Sodium: 136 mmol/L (ref 135–145)

## 2020-11-12 LAB — CBC
HCT: 35 % — ABNORMAL LOW (ref 39.0–52.0)
Hemoglobin: 12.1 g/dL — ABNORMAL LOW (ref 13.0–17.0)
MCH: 31.2 pg (ref 26.0–34.0)
MCHC: 34.6 g/dL (ref 30.0–36.0)
MCV: 90.2 fL (ref 80.0–100.0)
Platelets: 188 10*3/uL (ref 150–400)
RBC: 3.88 MIL/uL — ABNORMAL LOW (ref 4.22–5.81)
RDW: 12.6 % (ref 11.5–15.5)
WBC: 13.3 10*3/uL — ABNORMAL HIGH (ref 4.0–10.5)
nRBC: 0 % (ref 0.0–0.2)

## 2020-11-12 NOTE — Progress Notes (Signed)
  Subjective:  POD #1 s/p total knee arthroplasty.   Patient reports left knee pain as mild to moderate.  Patient states he had minimal pain this morning and was able to walk in the hallway with physical therapy.  After physical therapy however he has had increasing pain in the left knee.  He had an episode of nausea and vomiting this morning.  He is currently up out of bed to a chair and doing well.  Objective:   VITALS:   Vitals:   11/11/20 2003 11/12/20 0400 11/12/20 0828 11/12/20 1243  BP: 132/81 125/68 119/72 125/77  Pulse: 84 72 73 72  Resp: 17 18 16 18   Temp: 97.7 F (36.5 C) 98.1 F (36.7 C) 98 F (36.7 C) 97.7 F (36.5 C)  TempSrc: Oral Oral    SpO2: 97% 96% 95% 91%  Weight:      Height:        PHYSICAL EXAM: Left lower extremity Neurovascular intact Sensation intact distally Intact pulses distally Dorsiflexion/Plantar flexion intact Incision: dressing C/D/I No cellulitis present Compartment soft  LABS  Results for orders placed or performed during the hospital encounter of 11/11/20 (from the past 24 hour(s))  CBC     Status: Abnormal   Collection Time: 11/12/20  6:19 AM  Result Value Ref Range   WBC 13.3 (H) 4.0 - 10.5 K/uL   RBC 3.88 (L) 4.22 - 5.81 MIL/uL   Hemoglobin 12.1 (L) 13.0 - 17.0 g/dL   HCT 35.0 (L) 39.0 - 52.0 %   MCV 90.2 80.0 - 100.0 fL   MCH 31.2 26.0 - 34.0 pg   MCHC 34.6 30.0 - 36.0 g/dL   RDW 12.6 11.5 - 15.5 %   Platelets 188 150 - 400 K/uL   nRBC 0.0 0.0 - 0.2 %  Basic metabolic panel     Status: Abnormal   Collection Time: 11/12/20  6:19 AM  Result Value Ref Range   Sodium 136 135 - 145 mmol/L   Potassium 4.5 3.5 - 5.1 mmol/L   Chloride 108 98 - 111 mmol/L   CO2 25 22 - 32 mmol/L   Glucose, Bld 139 (H) 70 - 99 mg/dL   BUN 22 8 - 23 mg/dL   Creatinine, Ser 1.24 0.61 - 1.24 mg/dL   Calcium 8.7 (L) 8.9 - 10.3 mg/dL   GFR, Estimated >60 >60 mL/min   Anion gap 3 (L) 5 - 15    DG Knee Left Port  Result Date:  11/11/2020 CLINICAL DATA:  Status post left knee arthroplasty. EXAM: PORTABLE LEFT KNEE - 1-2 VIEW COMPARISON:  No recent. FINDINGS: Total left knee replacement.  Hardware intact.  Anatomic alignment. IMPRESSION: Total left knee replacement with anatomic alignment. Electronically Signed   By: Marcello Moores  Register   On: 11/11/2020 13:13    Assessment/Plan: 1 Day Post-Op   Active Problems:   S/P TKR (total knee replacement) using cement, left  Patient doing well postop.  Continue with physical therapy.  Patient is weightbearing as tolerated left lower extremity.  He will begin Lovenox 40 mg daily for DVT prophylaxis while an inpatient.  Patient's dressing will be changed tomorrow.  Patient's hemoglobin and hematocrit remain stable.    Thornton Park , MD 11/12/2020, 1:01 PM

## 2020-11-12 NOTE — Evaluation (Signed)
Occupational Therapy Evaluation Patient Details Name: Henry [name] MRN: [id number] DOB: 1945/07/21 Today's Date: 11/12/2020    History of Present Illness Pt admitted for L TKR (6/28). History includes GERD, HLD, and HTN.   Clinical Impression   Pt seen for OT evaluation this date, POD#1 from above surgery. Pt was independent in all ADLs and functional mobility prior to surgery. Pt reports that he enjoys hiking and is eager to return to PLOF with less pain and improved safety and independence. Pt currently requires MIN GUARD for functional mobility of short household distances (~18ft) with RW, MIN GUARD for toilet transfers, and MIN GUARD for standing grooming tasks due to pain and limited AROM of L knee. Of note, pt (+) emesis following walk to bathroom, however denying any dizziness. Pt instructed in polar care mgt, falls prevention strategies, home/routines modifications, and DME/AE for LB bathing/dressing tasks. Handout provided.  Pt would benefit from additional skilled OT services including additional instruction in techniques with or without assistive devices for dressing and bathing skills to support recall and carryover prior to discharge and ultimately to maximize safety, independence, and minimize falls risk and caregiver burden. No OT follow-up recommended following discharge.      Follow Up Recommendations  No OT follow up;Supervision - Intermittent    Equipment Recommendations  3 in 1 bedside commode       Precautions / Restrictions Precautions Precautions: Fall;Knee Precaution Booklet Issued: No Restrictions Weight Bearing Restrictions: Yes LLE Weight Bearing: Weight bearing as tolerated      Mobility Bed Mobility               General bed mobility comments: not assessed, pt in recliner at beginning and end of session    Transfers Overall transfer level: Needs assistance Equipment used: Rolling walker (2 wheeled) Transfers: Sit to/from Stand Sit to Stand:  Min guard         General transfer comment: safe technique with RW    Balance Overall balance assessment: Needs assistance Sitting-balance support: Feet supported Sitting balance-Leahy Scale: Good     Standing balance support: Bilateral upper extremity supported;During functional activity Standing balance-Leahy Scale: Good                             ADL either performed or assessed with clinical judgement   ADL Overall ADL's : Needs assistance/impaired     Grooming: Wash/dry face;Min guard;Standing;Oral care;Set up;Sitting                   Toilet Transfer: Min guard;BSC;Ambulation;RW           Functional mobility during ADLs: Min guard;Rolling walker       Vision Baseline Vision/History: Wears glasses Wears Glasses: Reading only Patient Visual Report: No change from baseline              Pertinent Vitals/Pain Pain Assessment: 0-10 Pain Score: 5  Pain Location: L knee Pain Descriptors / Indicators: Operative site guarding Pain Intervention(s): Limited activity within patient's tolerance;Monitored during session;Repositioned        Extremity/Trunk Assessment Upper Extremity Assessment Upper Extremity Assessment: Overall WFL for tasks assessed   Lower Extremity Assessment Lower Extremity Assessment: Defer to PT evaluation       Communication Communication Communication: No difficulties   Cognition Arousal/Alertness: Awake/alert Behavior During Therapy: WFL for tasks assessed/performed Overall Cognitive Status: Within Functional Limits for tasks assessed  Exercises Other Exercises Other Exercises: Pt instructed in polar care mgt, falls prevention strategies, home/routines modifications, and DME/AE for LB bathing/dressing tasks. Handout provided.         Home Living Family/patient expects to be discharged to:: Private residence Living Arrangements: Spouse/significant  other Available Help at Discharge: Family;Available 24 hours/day Type of Home: House Home Access: Stairs to enter [employer] of Steps: 2 Entrance Stairs-Rails: None Home Layout: One level     Bathroom Shower/Tub: Occupational psychologist: Handicapped height     Home Equipment: Toilet riser;Shower seat;Hand held shower head;Grab bars - toilet          Prior Functioning/Environment Level of Independence: Independent        Comments: no falls, indep with all mobility and ADLs. Pt enjoys hiking        OT Problem List: Decreased range of motion;Impaired balance (sitting and/or standing);Decreased knowledge of use of DME or AE;Pain      OT Treatment/Interventions: Self-care/ADL training;Therapeutic exercise;Energy conservation;DME and/or AE instruction;Therapeutic activities;Patient/family education;Balance training    OT Goals(Current goals can be found in the care plan section) Acute Rehab OT Goals Patient Stated Goal: to go home OT Goal Formulation: With patient Time For Goal Achievement: 11/26/20 Potential to Achieve Goals: Good ADL Goals Pt Will Perform Grooming: with modified independence;standing Pt Will Perform Lower Body Dressing: with modified independence;with adaptive equipment;sit to/from stand Pt Will Perform Toileting - Clothing Manipulation and hygiene: with modified independence;sitting/lateral leans  OT Frequency: Min 1X/week    AM-PAC OT "6 Clicks" Daily Activity     Outcome Measure Help from another person eating meals?: None Help from another person taking care of personal grooming?: A Little Help from another person toileting, which includes using toliet, bedpan, or urinal?: A Little Help from another person bathing (including washing, rinsing, drying)?: A Little Help from another person to put on and taking off regular upper body clothing?: None Help from another person to put on and taking off regular lower body clothing?: A  Lot 6 Click Score: 19   End of Session Equipment Utilized During Treatment: Gait belt;Rolling walker Nurse Communication: Mobility status  Activity Tolerance: Patient tolerated treatment well Patient left: in chair;with call bell/phone within reach;with chair alarm set  OT Visit Diagnosis: Unsteadiness on feet (R26.81)                Time: 1023-1050 OT Time Calculation (min): 27 min Charges:  OT General Charges $OT Visit: 1 Visit OT Evaluation $OT Eval Moderate Complexity: 1 Mod OT Treatments $Self Care/Home Management : 8-22 mins  Fredirick Maudlin, OTR/L [place]

## 2020-11-12 NOTE — Progress Notes (Signed)
Physical Therapy Treatment Patient Details Name: Henry [name] MRN: [id number] DOB: 1946/03/03 Today's Date: 11/12/2020    History of Present Illness Pt admitted for L TKR (6/28). History includes GERD, HLD, and HTN.    PT Comments    Pt is making good progress towards goals with ability to ambulate around RN station. Does endorse slight pain/dizziness with exertion. Good endurance with there-ex and progress towards AAROM. Will continue to progress as able.  Follow Up Recommendations  Home health PT     Equipment Recommendations  Rolling walker with 5" wheels    Recommendations for Other Services       Precautions / Restrictions Precautions Precautions: Fall;Knee Precaution Booklet Issued: Yes (comment) Restrictions Weight Bearing Restrictions: Yes LLE Weight Bearing: Weight bearing as tolerated    Mobility  Bed Mobility Overal bed mobility: Needs Assistance Bed Mobility: Supine to Sit     Supine to sit: Min guard     General bed mobility comments: safe technique with guidance for L leg off bed. Once seated, no dizziness noted    Transfers Overall transfer level: Needs assistance Equipment used: Rolling walker (2 wheeled) Transfers: Sit to/from Stand Sit to Stand: Min guard         General transfer comment: needs bed elevated prior to standing and cues for hand placement.  Ambulation/Gait Ambulation/Gait assistance: Min guard Gait Distance (Feet): 200 Feet Assistive device: Rolling walker (2 wheeled) Gait Pattern/deviations: Step-to pattern     General Gait Details: able to perform step to gait pattern, however improved to reciprocal gait pattern- inconsistent. RW used. Reports slightly dizzy with exertion   Stairs             Wheelchair Mobility    Modified Rankin (Stroke Patients Only)       Balance Overall balance assessment: Needs assistance Sitting-balance support: Feet supported Sitting balance-Leahy Scale: Good     Standing  balance support: Bilateral upper extremity supported;During functional activity Standing balance-Leahy Scale: Good                              Cognition Arousal/Alertness: Awake/alert Behavior During Therapy: WFL for tasks assessed/performed Overall Cognitive Status: Within Functional Limits for tasks assessed                                        Exercises Total Joint Exercises Goniometric ROM: L knee AAROM: 4-84 degrees Other Exercises Other Exercises: Pt instructed in polar care mgt, falls prevention strategies, home/routines modifications, and DME/AE for LB bathing/dressing tasks. Handout provided. Other Exercises: supine ther-ex performed on L LE including AP, quad sets, SLRs, hip abd/add, and SAQ. All ther-ex performed x 10 reps with cues for technique Other Exercises: Able to ambulate to bathroom for urination. Supervision given with RW    General Comments        Pertinent Vitals/Pain Pain Assessment: 0-10 Pain Score: 3  Pain Location: L knee Pain Descriptors / Indicators: Operative site guarding Pain Intervention(s): Limited activity within patient's tolerance;Premedicated before session;Repositioned;Ice applied    Home Living Family/patient expects to be discharged to:: Private residence Living Arrangements: Spouse/significant other Available Help at Discharge: Family;Available 24 hours/day Type of Home: House Home Access: Stairs to enter Entrance Stairs-Rails: None Home Layout: One level Home Equipment: Toilet riser;Shower seat;Hand held shower head;Grab bars - toilet      Prior Function  Level of Independence: Independent      Comments: no falls, indep with all mobility and ADLs. Pt enjoys hiking   PT Goals (current goals can now be found in the care plan section) Acute Rehab PT Goals Patient Stated Goal: to go home PT Goal Formulation: With patient Time For Goal Achievement: 11/25/20 Potential to Achieve Goals: Good Progress  towards PT goals: Progressing toward goals    Frequency    BID      PT Plan Current plan remains appropriate    Co-evaluation              AM-PAC PT "6 Clicks" Mobility   Outcome Measure  Help needed turning from your back to your side while in a flat bed without using bedrails?: A Little Help needed moving from lying on your back to sitting on the side of a flat bed without using bedrails?: A Little Help needed moving to and from a bed to a chair (including a wheelchair)?: A Little Help needed standing up from a chair using your arms (e.g., wheelchair or bedside chair)?: A Little Help needed to walk in hospital room?: A Little Help needed climbing 3-5 steps with a railing? : A Little 6 Click Score: 18    End of Session Equipment Utilized During Treatment: Gait belt Activity Tolerance: Patient tolerated treatment well Patient left: in chair;with chair alarm set;with SCD's reapplied Nurse Communication: Mobility status PT Visit Diagnosis: Muscle weakness (generalized) (M62.81);Difficulty in walking, not elsewhere classified (R26.2);Pain Pain - Right/Left: Left Pain - part of body: Knee     Time: 3838-1840 PT Time Calculation (min) (ACUTE ONLY): 38 min  Charges:  $Gait Training: 8-22 mins $Therapeutic Exercise: 8-22 mins $Therapeutic Activity: 8-22 mins                     Henry [name], PT, DPT [phone]    Henry [name] 11/12/2020, 12:31 PM

## 2020-11-12 NOTE — Progress Notes (Signed)
Physical Therapy Treatment Patient Details Name: Henry [name] MRN: [id number] DOB: May 22, 1945 Today's Date: 11/12/2020    History of Present Illness Pt admitted for L TKR (6/28). History includes GERD, HLD, and HTN.    PT Comments    Pt is making good progress towards goals with ability to ambulate hallway distances and tolerate HEP. Will need to perform stair training next session. Will continue to progress.   Follow Up Recommendations  Home health PT     Equipment Recommendations  Rolling walker with 5" wheels    Recommendations for Other Services       Precautions / Restrictions Precautions Precautions: Fall;Knee Precaution Booklet Issued: Yes (comment) Restrictions Weight Bearing Restrictions: Yes LLE Weight Bearing: Weight bearing as tolerated    Mobility  Bed Mobility Overal bed mobility: Needs Assistance Bed Mobility: Supine to Sit;Sit to Supine       Sit to supine: Min guard   General bed mobility comments: follows commands well    Transfers Overall transfer level: Needs assistance Equipment used: Rolling walker (2 wheeled) Transfers: Sit to/from Stand Sit to Stand: Min guard         General transfer comment: bed elevated. Safe technique with slight assist for surgical leg. Assisted in repositioning  Ambulation/Gait Ambulation/Gait assistance: Min guard Gait Distance (Feet): 120 Feet Assistive device: Rolling walker (2 wheeled) Gait Pattern/deviations: Step-through pattern     General Gait Details: reciprocal gait pattern with safe technique using RW. Fatigues quickly   Marine scientist Rankin (Stroke Patients Only)       Balance Overall balance assessment: Needs assistance Sitting-balance support: Feet supported Sitting balance-Leahy Scale: Good     Standing balance support: Bilateral upper extremity supported;During functional activity Standing balance-Leahy Scale: Good                               Cognition Arousal/Alertness: Awake/alert Behavior During Therapy: WFL for tasks assessed/performed Overall Cognitive Status: Within Functional Limits for tasks assessed                                        Exercises Other Exercises Other Exercises: supine ther-ex performed on L LE including AP, quad sets, LAQ, heel slides, and knee flexion. All ther-ex performed x 10 reps with cues for technique Other Exercises: Able to ambulate to bathroom for urination. Supervision given with RW    General Comments        Pertinent Vitals/Pain Pain Assessment: 0-10 Pain Score: 3  Pain Location: L knee Pain Descriptors / Indicators: Operative site guarding Pain Intervention(s): Limited activity within patient's tolerance    Home Living                      Prior Function            PT Goals (current goals can now be found in the care plan section) Acute Rehab PT Goals Patient Stated Goal: to go home PT Goal Formulation: With patient Time For Goal Achievement: 11/25/20 Potential to Achieve Goals: Good Progress towards PT goals: Progressing toward goals    Frequency    BID      PT Plan Current plan remains appropriate    Co-evaluation  AM-PAC PT "6 Clicks" Mobility   Outcome Measure  Help needed turning from your back to your side while in a flat bed without using bedrails?: A Little Help needed moving from lying on your back to sitting on the side of a flat bed without using bedrails?: A Little Help needed moving to and from a bed to a chair (including a wheelchair)?: A Little Help needed standing up from a chair using your arms (e.g., wheelchair or bedside chair)?: A Little Help needed to walk in hospital room?: A Little Help needed climbing 3-5 steps with a railing? : A Little 6 Click Score: 18    End of Session Equipment Utilized During Treatment: Gait belt Activity Tolerance: Patient tolerated  treatment well Patient left: in bed;with bed alarm set Nurse Communication: Mobility status PT Visit Diagnosis: Muscle weakness (generalized) (M62.81);Difficulty in walking, not elsewhere classified (R26.2);Pain Pain - Right/Left: Left Pain - part of body: Knee     Time: 1340-1409 PT Time Calculation (min) (ACUTE ONLY): 29 min  Charges:  $Gait Training: 8-22 mins $Therapeutic Exercise: 8-22 mins                     Greggory Stallion, PT, DPT [phone]    Henry [name] 11/12/2020, 4:53 PM

## 2020-11-12 NOTE — TOC Progression Note (Addendum)
Transition of Care [facility]) - Progression Note    Patient Details  Name: Henry [name] MRN: [id number] Date of Birth: 1945-12-21  [facility] CM/SW [place], RN Phone Number: 11/12/2020, 1:33 PM  Clinical Narrative:     Met with the patient and discussed DC plan and needs He needs PT and [facility] has accepted him He needs a RW and a 3 in 1, Rhonda with [employer] will bring it into the room He has transportation and can afford his medication        Expected Discharge Plan and Services                                                 Social Determinants of Health (SDOH) Interventions    Readmission Risk Interventions No flowsheet data found.

## 2020-11-12 NOTE — Anesthesia Postprocedure Evaluation (Signed)
Anesthesia Post Note  Patient: Henry [name]  Procedure(s) Performed: LEFT TOTAL KNEE ARTHROPLASTY (Left: Knee)  Patient location during evaluation: Nursing Unit Anesthesia Type: Spinal Level of consciousness: awake, oriented and awake and alert Pain management: pain level controlled Vital Signs Assessment: post-procedure vital signs reviewed and stable Respiratory status: spontaneous breathing, respiratory function stable and nonlabored ventilation Cardiovascular status: blood pressure returned to baseline and stable Postop Assessment: no headache and no backache Anesthetic complications: no   No notable events documented.   Last Vitals:  Vitals:   11/12/20 0400 11/12/20 0828  BP: 125/68 119/72  Pulse: 72 73  Resp: 18 16  Temp: 36.7 C 36.7 C  SpO2: 96% 95%    Last Pain:  Vitals:   11/12/20 0730  TempSrc:   PainSc: 2                  Proofreader

## 2020-11-13 ENCOUNTER — Observation Stay: Payer: Medicare PPO

## 2020-11-13 LAB — COMPREHENSIVE METABOLIC PANEL
ALT: 10 U/L (ref 0–44)
AST: 17 U/L (ref 15–41)
Albumin: 3.5 g/dL (ref 3.5–5.0)
Alkaline Phosphatase: 52 U/L (ref 38–126)
Anion gap: 7 (ref 5–15)
BUN: 16 mg/dL (ref 8–23)
CO2: 26 mmol/L (ref 22–32)
Calcium: 9.1 mg/dL (ref 8.9–10.3)
Chloride: 103 mmol/L (ref 98–111)
Creatinine, Ser: 1.36 mg/dL — ABNORMAL HIGH (ref 0.61–1.24)
GFR, Estimated: 55 mL/min — ABNORMAL LOW (ref >60)
Glucose, Bld: 120 mg/dL — ABNORMAL HIGH (ref 70–99)
Potassium: 4.1 mmol/L (ref 3.5–5.1)
Sodium: 136 mmol/L (ref 135–145)
Total Bilirubin: 1.1 mg/dL (ref 0.3–1.2)
Total Protein: 6.6 g/dL (ref 6.5–8.1)

## 2020-11-13 LAB — CBC
HCT: 33.4 % — ABNORMAL LOW (ref 39.0–52.0)
Hemoglobin: 11.6 g/dL — ABNORMAL LOW (ref 13.0–17.0)
MCH: 31.9 pg (ref 26.0–34.0)
MCHC: 34.7 g/dL (ref 30.0–36.0)
MCV: 91.8 fL (ref 80.0–100.0)
Platelets: 169 10*3/uL (ref 150–400)
RBC: 3.64 MIL/uL — ABNORMAL LOW (ref 4.22–5.81)
RDW: 12.9 % (ref 11.5–15.5)
WBC: 12 10*3/uL — ABNORMAL HIGH (ref 4.0–10.5)
nRBC: 0 % (ref 0.0–0.2)

## 2020-11-13 LAB — MAGNESIUM: Magnesium: 1.8 mg/dL (ref 1.7–2.4)

## 2020-11-13 MED ORDER — SODIUM CHLORIDE 0.9 % IV SOLN
12.5000 mg | Freq: Four times a day (QID) | INTRAVENOUS | Status: DC | PRN
  Filled 2020-11-13 (×3): qty 0.5

## 2020-11-13 MED ORDER — MAGNESIUM CHLORIDE 64 MG PO TBEC
1.0000 | DELAYED_RELEASE_TABLET | Freq: Once | ORAL | Status: AC
  Administered 2020-11-13: 64 mg via ORAL
  Filled 2020-11-13: qty 1

## 2020-11-13 MED ORDER — MAGNESIUM CITRATE PO SOLN
1.0000 | Freq: Once | ORAL | Status: AC
  Administered 2020-11-13: 1 via ORAL
  Filled 2020-11-13: qty 296

## 2020-11-13 MED ORDER — BISACODYL 10 MG RE SUPP: 10.0000 mg | Freq: Every day | RECTAL | Status: DC | PRN

## 2020-11-13 NOTE — Consult Note (Signed)
Henry [name] , MD [address], [place], [place], [place], [address] [address] [place], [place], [place], [address] Phone: [phone]  Fax: [phone]  Consultation  Referring Provider:     Dr Mack Guise Primary Care Physician:  Henry [name]., MD Primary Gastroenterologist: None          Reason for Consultation:     Nausea, vomiting   Date of Admission:  11/11/2020 Date of Consultation:  11/13/2020         HPI:   Henry [name] is a 75 y.o. male s/p total knee replacement of his left knee postoperative day 1.  I have been consulted for possible abdominal distention nausea vomiting which occurred earlier this day.  Electrolytes when checked yesterday were normal.  Hemoglobin was .  12.1 g yesterday and today is 11.6 g.  He is on oxycodone.Says that since this morning has had some abdominal distension , nausea and was vomiting earlier today . Over the past few hours he has passed gas, no nausea and no vomiting . Still has abdominal distension with no pain but the degree of distension is much better.    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   Cataract    GERD (gastroesophageal reflux disease)    Hyperlipidemia    Hypertension    Pneumonia     Past Surgical History:  Procedure Laterality Date   CATARACT EXTRACTION Bilateral    COLONOSCOPY     MOLE REMOVAL     NECK MASS EXCISION     PORTACATH PLACEMENT N/A 12/29/2016   Procedure: INSERTION PORT-A-CATH;  Surgeon: Christene Lye, MD;  Location: [facility] OR[facility];  Service: General;  Laterality: N/A;   TONSILLECTOMY     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   Procedure: LEFT TOTAL KNEE ARTHROPLASTY;  Surgeon: Thornton Park, MD;  Location: [facility];  Service: Orthopedics;  Laterality: Left;    Prior to Admission medications   Medication Sig Start Date End Date Taking? Authorizing Provider  amLODipine (NORVASC) 5 MG tablet TAKE ONE TABLET BY MOUTH  DAILY Patient taking differently: Take 5 mg by mouth daily. 02/23/20  Yes Henry [name]., MD  aspirin EC 81 MG tablet Take 81 mg by mouth daily.   Yes [provider]  cetirizine (ZYRTEC) 10 MG tablet Take 10 mg by mouth daily.   Yes [provider]  Cholecalciferol (VITAMIN D) 50 MCG (2000 UT) CAPS Take 2,000 Units by mouth daily. 10/02/12  Yes [provider]  clotrimazole-betamethasone (LOTRISONE) cream Apply 1 application topically daily as needed (Rash).   Yes [provider]  docusate sodium (COLACE) 50 MG capsule Take 50 mg by mouth daily.   Yes [provider]  ibuprofen (ADVIL,MOTRIN) 200 MG tablet Take 800 mg by mouth every 8 (eight) hours as needed (for pain).   Yes [provider]  lisinopril (ZESTRIL) 40 MG tablet TAKE ONE TABLET BY MOUTH DAILY Patient taking differently: Take 40 mg by mouth daily. 02/23/20  Yes Henry [name]., MD  omeprazole (PRILOSEC) 20 MG capsule TAKE ONE CAPSULE BY MOUTH DAILY Patient taking differently: Take 20 mg by mouth daily. 02/23/20  Yes Henry [name]., MD  phenylephrine-shark liver oil-mineral oil-petrolatum (PREPARATION H) 0.25-14-74.9 % rectal ointment Place 1 application rectally 2 (two) times daily as needed for hemorrhoids.   Yes [provider]  TURMERIC PO  Take 2,000 mg by mouth daily.   Yes [provider]  ZINC GLUCONATE PO Take by mouth daily. Immune Health   Yes [provider]    Family History  Problem Relation Age of Onset   Heart disease Mother    Rheumatic fever Mother    CAD Mother    Heart attack Father    COPD Sister      Social History   Tobacco Use   Smoking status: Never   Smokeless tobacco: Never  Vaping Use   Vaping Use: Never used  Substance Use Topics   Alcohol use: Yes    Alcohol/week: 8.0 standard drinks    Types: 8 Cans of beer per week    Comment: 2 beers four days a week   Drug use: No    Allergies as of  09/16/2020 - Review Complete 06/25/2020  Allergen Reaction Noted   Lovastatin Other (See Comments) 09/23/2014   Pravastatin Nausea And Vomiting 11/25/2014   Pravastatin sodium  09/23/2014   Simvastatin Other (See Comments) 09/23/2014   Statins  09/23/2014   Sulfa antibiotics Rash and Itching 09/23/2014    Review of Systems:    All systems reviewed and negative except where noted in HPI.   Physical Exam:  Vital signs in last 24 hours: Temp:  [98.7 F (37.1 C)-99 F (37.2 C)] 98.7 F (37.1 C) (06/30 1156) Pulse Rate:  [76-93] 93 (06/30 1156) Resp:  [16-19] 17 (06/30 1156) BP: (130-161)/(74-91) 135/83 (06/30 1156) SpO2:  [94 %-97 %] 97 % (06/30 1156) Last BM Date: 11/09/20 General:   Pleasant, cooperative in NAD Head:  Normocephalic and atraumatic. Eyes:   No icterus.   Conjunctiva pink. PERRLA. Ears:  Normal auditory acuity. Neck:  Supple; no masses or thyroidomegaly Lungs: Respirations even and unlabored. Lungs clear to auscultation bilaterally.   No wheezes, crackles, or rhonchi.  Heart:  Regular rate and rhythm;  Without murmur, clicks, rubs or gallops Abdomen:  Soft, distended, tympanic on percussion, nontender. Normal bowel sounds. No appreciable masses or hepatomegaly.  No rebound or guarding.  Neurologic:  Alert and oriented x3;  grossly normal neurologically. Skin:  Intact without significant lesions or rashes. Cervical Nodes:  No significant cervical adenopathy. Psych:  Alert and cooperative. Normal affect.  LAB RESULTS: Recent Labs    11/12/20 0619 11/13/20 0645  WBC 13.3* 12.0*  HGB 12.1* 11.6*  HCT 35.0* 33.4*  PLT 188 169   BMET Recent Labs    11/12/20 0619  NA 136  K 4.5  CL 108  CO2 25  GLUCOSE 139*  BUN 22  CREATININE 1.24  CALCIUM 8.7*   LFT No results for input(s): PROT, ALBUMIN, AST, ALT, ALKPHOS, BILITOT, BILIDIR, IBILI in the last 72 hours. PT/INR No results for input(s): LABPROT, INR in the last 72 hours.  STUDIES: No results  found.    Impression / Plan:   Henry [name] is a 75 y.o. y/o male with who is postoperative day 1 status post total knee replacement.  I have been consulted for concerns of an ileus.  X-ray of the abdomen suggests possible ileus  Plan 1.  Consider replacing magnesium to greater than 2.0.  Keep potassium greater than 4.0.  Limit the use of narcotics. Keep NPO till passing more gas and abdominal distension resolves. If abdominal distension worsens or has vomiting then place NG tube to suction.  Ambulate.  Maintain IV fluids for hydration.Usually should resolve with conservative management , if has nausea could consider Reglan .  Thank you for involving me in the care of this patient.      LOS: 0 days   Henry Bellows, MD  11/13/2020, 1:43 PM

## 2020-11-13 NOTE — Progress Notes (Signed)
Physical Therapy Treatment Patient Details Name: Henry [name] MRN: [id number] DOB: 1945/12/07 Today's Date: 11/13/2020    History of Present Illness Pt admitted for L TKR (6/28). History includes GERD, HLD, and HTN.    PT Comments    Pt is making good progress towards goals with ability to safely ambulate and navigate stairs. Still complains of nausea with several bouts of dry heaves during ambulation. Good progress with AAROM and there-ex. Has met therapy goals, care team aware.    Follow Up Recommendations  Home health PT     Equipment Recommendations  Rolling walker with 5" wheels    Recommendations for Other Services       Precautions / Restrictions Precautions Precautions: Fall;Knee Precaution Booklet Issued: Yes (comment)    Mobility  Bed Mobility Overal bed mobility: Needs Assistance Bed Mobility: Supine to Sit;Sit to Supine     Supine to sit: Min guard Sit to supine: Min guard   General bed mobility comments: follows commands well    Transfers Overall transfer level: Needs assistance Equipment used: Rolling walker (2 wheeled) Transfers: Sit to/from Stand Sit to Stand: Min guard         General transfer comment: bed elevated. Safe technique with slight assist for surgical leg. Assisted in repositioning  Ambulation/Gait Ambulation/Gait assistance: Min guard Gait Distance (Feet): 250 Feet Assistive device: Rolling walker (2 wheeled) Gait Pattern/deviations: Step-through pattern     General Gait Details: reciprocal gait pattern with safe technique using RW. Stops several times for dry heaves. Cues for heel strike   Stairs Stairs: Yes Stairs assistance: Min guard Stair Management: No rails;Backwards Number of Stairs: 2 General stair comments: demonstrated prior to performance. Safe technique with use of RW.   Wheelchair Mobility    Modified Rankin (Stroke Patients Only)       Balance Overall balance assessment: Needs  assistance Sitting-balance support: Feet supported Sitting balance-Leahy Scale: Good     Standing balance support: Bilateral upper extremity supported;During functional activity Standing balance-Leahy Scale: Good                              Cognition Arousal/Alertness: Awake/alert Behavior During Therapy: WFL for tasks assessed/performed Overall Cognitive Status: Within Functional Limits for tasks assessed                                        Exercises Total Joint Exercises Goniometric ROM: L knee AAROM: 3-78 degrees Other Exercises Other Exercises: supine ther-ex performed on L LE including AP, quad sets, SAQ, SLR, and hip abd/add. All ther-ex performed x 12 reps with cues for technique.    General Comments        Pertinent Vitals/Pain Pain Assessment: 0-10 Pain Score: 4  Pain Location: L knee Pain Descriptors / Indicators: Operative site guarding Pain Intervention(s): Limited activity within patient's tolerance;Premedicated before session;Ice applied    Home Living                      Prior Function            PT Goals (current goals can now be found in the care plan section) Acute Rehab PT Goals Patient Stated Goal: to go home PT Goal Formulation: With patient Time For Goal Achievement: 11/25/20 Potential to Achieve Goals: Good Progress towards PT goals: Progressing toward goals  Frequency    BID      PT Plan Current plan remains appropriate    Co-evaluation              AM-PAC PT "6 Clicks" Mobility   Outcome Measure  Help needed turning from your back to your side while in a flat bed without using bedrails?: A Little Help needed moving from lying on your back to sitting on the side of a flat bed without using bedrails?: A Little Help needed moving to and from a bed to a chair (including a wheelchair)?: A Little Help needed standing up from a chair using your arms (e.g., wheelchair or bedside  chair)?: A Little Help needed to walk in hospital room?: A Little Help needed climbing 3-5 steps with a railing? : A Little 6 Click Score: 18    End of Session Equipment Utilized During Treatment: Gait belt Activity Tolerance: Patient tolerated treatment well Patient left: in bed;with bed alarm set Nurse Communication: Mobility status PT Visit Diagnosis: Muscle weakness (generalized) (M62.81);Difficulty in walking, not elsewhere classified (R26.2);Pain Pain - Right/Left: Left Pain - part of body: Knee     Time: 0912-0945 PT Time Calculation (min) (ACUTE ONLY): 33 min  Charges:  $Gait Training: 8-22 mins $Therapeutic Exercise: 8-22 mins                     Greggory Stallion, PT, DPT [phone]    [name] 11/13/2020, 12:29 PM

## 2020-11-13 NOTE — Op Note (Signed)
  Subjective:  POD #2 s/p left total knee arthroplasty.   Patient reports left knee pain as minimal.  Patient states he has been up ambulating with physical therapy and is not having knee pain.  He continues to have nausea and bouts of vomiting.  The physical therapy progress note reports he was dry heaving at times when ambulating with them.  Patient was ordered for mag citrate but he was unable to tolerate this due to nausea and vomiting.  Patient states he is not passing much flatus and has not had a bowel movement.  He feels that his abdomen is distended.  Objective:   VITALS:   Vitals:   11/12/20 1938 11/13/20 0423 11/13/20 0805 11/13/20 1156  BP: 139/74 130/84 (!) 161/91 135/83  Pulse: 84 90 92 93  Resp: 17 19 17 17   Temp: 99 F (37.2 C) 99 F (37.2 C) 98.7 F (37.1 C) 98.7 F (37.1 C)  TempSrc: Oral Oral  Oral  SpO2: 96% 94% 95% 97%  Weight:      Height:        PHYSICAL EXAM: General patient alert and in no acute distress.  Abdomen: Patient has mild to moderate distention but is nontender in his abdomen is compressible.  Left lower extremity: I personally change the patient's dressing today.  His Aquacel dressing has minimal sanguinous spotting.  He has no erythema or significant ecchymosis.  Patient has a small hemarthrosis. Neurovascular intact Sensation intact distally Intact pulses distally Dorsiflexion/Plantar flexion intact Incision: dressing C/D/I No cellulitis present Compartment soft  LABS  Results for orders placed or performed during the hospital encounter of 11/11/20 (from the past 24 hour(s))  CBC     Status: Abnormal   Collection Time: 11/13/20  6:45 AM  Result Value Ref Range   WBC 12.0 (H) 4.0 - 10.5 K/uL   RBC 3.64 (L) 4.22 - 5.81 MIL/uL   Hemoglobin 11.6 (L) 13.0 - 17.0 g/dL   HCT 33.4 (L) 39.0 - 52.0 %   MCV 91.8 80.0 - 100.0 fL   MCH 31.9 26.0 - 34.0 pg   MCHC 34.7 30.0 - 36.0 g/dL   RDW 12.9 11.5 - 15.5 %   Platelets 169 150 - 400 K/uL    nRBC 0.0 0.0 - 0.2 %    No results found.  Assessment/Plan: 2 Days Post-Op   Active Problems:   S/P TKR (total knee replacement) using cement, left  Patient is doing well orthopedically.  His physical therapist states that he is cleared all PT goals and is okay for discharge home from their standpoint.  Patient however is continue to have nausea and vomiting.  I am ordering a KUB and have consulted Dr. Vicente Males in gastroenterology who will evaluate the patient after the KUB has been completed.    Thornton Park , MD 11/13/2020, 1:49 PM

## 2020-11-14 DIAGNOSIS — Z885 Allergy status to narcotic agent status: Secondary | ICD-10-CM | POA: Diagnosis not present

## 2020-11-14 DIAGNOSIS — Z20822 Contact with and (suspected) exposure to covid-19: Secondary | ICD-10-CM | POA: Diagnosis present

## 2020-11-14 DIAGNOSIS — Z888 Allergy status to other drugs, medicaments and biological substances status: Secondary | ICD-10-CM | POA: Diagnosis not present

## 2020-11-14 DIAGNOSIS — K567 Ileus, unspecified: Secondary | ICD-10-CM

## 2020-11-14 DIAGNOSIS — E785 Hyperlipidemia, unspecified: Secondary | ICD-10-CM | POA: Diagnosis present

## 2020-11-14 DIAGNOSIS — K9189 Other postprocedural complications and disorders of digestive system: Secondary | ICD-10-CM

## 2020-11-14 DIAGNOSIS — Z96659 Presence of unspecified artificial knee joint: Secondary | ICD-10-CM

## 2020-11-14 DIAGNOSIS — I10 Essential (primary) hypertension: Secondary | ICD-10-CM | POA: Diagnosis present

## 2020-11-14 DIAGNOSIS — Z7982 Long term (current) use of aspirin: Secondary | ICD-10-CM | POA: Diagnosis not present

## 2020-11-14 DIAGNOSIS — Z882 Allergy status to sulfonamides status: Secondary | ICD-10-CM | POA: Diagnosis not present

## 2020-11-14 DIAGNOSIS — K219 Gastro-esophageal reflux disease without esophagitis: Secondary | ICD-10-CM | POA: Diagnosis present

## 2020-11-14 DIAGNOSIS — M1712 Unilateral primary osteoarthritis, left knee: Secondary | ICD-10-CM | POA: Diagnosis present

## 2020-11-14 DIAGNOSIS — Z79899 Other long term (current) drug therapy: Secondary | ICD-10-CM | POA: Diagnosis not present

## 2020-11-14 LAB — BASIC METABOLIC PANEL
Anion gap: 6 (ref 5–15)
BUN: 18 mg/dL (ref 8–23)
CO2: 27 mmol/L (ref 22–32)
Calcium: 8.4 mg/dL — ABNORMAL LOW (ref 8.9–10.3)
Chloride: 104 mmol/L (ref 98–111)
Creatinine, Ser: 1.38 mg/dL — ABNORMAL HIGH (ref 0.61–1.24)
GFR, Estimated: 54 mL/min — ABNORMAL LOW (ref >60)
Glucose, Bld: 93 mg/dL (ref 70–99)
Potassium: 3.8 mmol/L (ref 3.5–5.1)
Sodium: 137 mmol/L (ref 135–145)

## 2020-11-14 MED ORDER — ASPIRIN EC 81 MG PO TBEC: 325.0000 mg | DELAYED_RELEASE_TABLET | Freq: Two times a day (BID) | ORAL | 11 refills | Status: DC

## 2020-11-14 MED ORDER — BISACODYL 5 MG PO TBEC: 5.0000 mg | DELAYED_RELEASE_TABLET | Freq: Every day | ORAL | 0 refills | Status: DC | PRN

## 2020-11-14 MED ORDER — OXYCODONE HCL 5 MG PO TABS: 5.0000 mg | ORAL_TABLET | ORAL | 0 refills | Status: DC | PRN

## 2020-11-14 MED ORDER — ACETAMINOPHEN 325 MG PO TABS: 325.0000 mg | ORAL_TABLET | Freq: Four times a day (QID) | ORAL | 1 refills | Status: DC | PRN

## 2020-11-14 NOTE — Progress Notes (Signed)
Henry [name] , MD [address], [place], [place], [place], [address] [address], [place], [place], [place], [address] Phone: [phone]  Fax: [phone]   Henry [name] is being followed for post op ileus  Day 1 of follow up   Subjective: Sitting out , feels and looks much better, passing gas , had 2 bowel movements watery , no nausea or vomiting , able to keep liquids down    Objective: Vital signs in last 24 hours: Vitals:   11/13/20 2002 11/14/20 0025 11/14/20 0444 11/14/20 0805  BP: 128/81 126/72 126/78 136/86  Pulse: 93 86 91 97  Resp: 18 18 18 16   Temp: 98.5 F (36.9 C) 98.2 F (36.8 C) 98.6 F (37 C) 99 F (37.2 C)  TempSrc: Oral Oral Oral Oral  SpO2: 94% 96% 95% 97%  Weight:      Height:       Weight change:   Intake/Output Summary (Last 24 hours) at 11/14/2020 5400 Last data filed at 11/14/2020 0038 Gross per 24 hour  Intake --  Output 450 ml  Net -450 ml     Exam:  Abdomen: soft, nontender, normal bowel sounds   Lab Results: @LABTEST2 @ Micro Results: Recent Results (from the past 240 hour(s))  SARS CORONAVIRUS 2 (TAT 6-24 HRS) Nasopharyngeal Nasopharyngeal Swab     Status: None   Collection Time: 11/07/20 10:47 AM   Specimen: Nasopharyngeal Swab  Result Value Ref Range Status   SARS Coronavirus 2 NEGATIVE NEGATIVE Final    Comment: (NOTE) SARS-CoV-2 target nucleic acids are NOT DETECTED.  The SARS-CoV-2 RNA is generally detectable in upper and lower respiratory specimens during the acute phase of infection. Negative results do not preclude SARS-CoV-2 infection, do not rule out co-infections with other pathogens, and should not be used as the sole basis for treatment or other patient management decisions. Negative results must be combined with clinical observations, patient history, and epidemiological information. The expected result is Negative.  Fact Sheet for Patients: SugarRoll.be  Fact Sheet for  Healthcare Providers: https://www.woods-mathews.com/  This test is not yet approved or cleared by the [place] FDA and  has been authorized for detection and/or diagnosis of SARS-CoV-2 by FDA under an Emergency Use Authorization (EUA). This EUA will remain  in effect (meaning this test can be used) for the duration of the COVID-19 declaration under Se ction 564(b)(1) of the Act, 21 U.S.C. section 360bbb-3(b)(1), unless the authorization is terminated or revoked sooner.  Performed at [facility], [place] [address], [place], [place] [address]    Studies/Results: DG Abd 1 View  Result Date: 11/13/2020 CLINICAL DATA:  Nausea and vomiting following knee replacement surgery 2 days ago, initial encounter EXAM: ABDOMEN - 1 VIEW COMPARISON:  None. FINDINGS: Scattered large and small bowel gas is noted likely representing a postoperative ileus. No free air is seen. No abnormal mass or abnormal calcifications are noted. IMPRESSION: Changes most likely related to a postoperative ileus. Electronically Signed   By: Henry [name] M.D.   On: 11/13/2020 14:50   Medications: I have reviewed the patient's current medications. Scheduled Meds:  amLODipine  5 mg Oral Daily   aspirin EC  81 mg Oral Daily   cholecalciferol  2,000 Units Oral Daily   clotrimazole   Topical BID   docusate sodium  100 mg Oral BID   enoxaparin (LOVENOX) injection  40 mg Subcutaneous Q24H   lisinopril  40 mg Oral Daily   loratadine  10 mg  Oral Daily   pantoprazole  40 mg Oral Daily   traMADol  50 mg Oral Q6H   Continuous Infusions:  sodium chloride 75 mL/hr at 11/12/20 0017   methocarbamol (ROBAXIN) IV     promethazine (PHENERGAN) injection (IM or IVPB)     PRN Meds:.acetaminophen, bisacodyl, bisacodyl, diphenhydrAMINE, methocarbamol **OR** methocarbamol (ROBAXIN) IV, ondansetron **OR** ondansetron (ZOFRAN) IV, oxyCODONE, promethazine (PHENERGAN) injection (IM or IVPB),  senna-docusate   Assessment: Active Problems:   S/P TKR (total knee replacement) using cement, left   Henry [name] is a 75 y.o. y/o male with who is postoperative day 1 status post total knee replacement.  I have been consulted for concerns of an ileus.  X-ray of the abdomen suggests possible ileus. Overnight he has improved significantly , has had bowel movements , keeping liquids down and no nausea or vomiting    Plan 1.  Monitor electrolytes till he transitions to solid food and has regular bowel movements . K > 4 and mg > 2.0 , limit narcotics, keep hydrated and ambulate.    I will sign off.  Please call me if any further GI concerns or questions.  We would like to thank you for the opportunity to participate in the care of Henry [name].      LOS: 0 days   Henry Bellows, MD 11/14/2020, 9:03 AM

## 2020-11-14 NOTE — Progress Notes (Signed)
Physical Therapy Treatment Patient Details Name: Henry [name] MRN: [id number] DOB: [birth year] Today's Date: 11/14/2020    History of Present Illness Pt admitted for L TKR (6/28). History includes GERD, HLD, and HTN.    PT Comments    Pt seen for PT tx with pt with c/o minimal pain & nausea during session. PT educates pt on importance of performing HEP throughout the day. Pt does demonstrate slightly less knee ROM on this date compared to yesterday. Pt is able to complete transfers & gait with RW & supervision with cuing for gait pattern. Will attempt stair negotiation for additional practice during PM session.     Follow Up Recommendations  Home health PT;Supervision for mobility/OOB     Equipment Recommendations  Rolling walker with 5" wheels    Recommendations for Other Services       Precautions / Restrictions Precautions Precautions: Fall;Knee    Mobility  Bed Mobility               General bed mobility comments: not observed, pt received & left sitting in recliner    Transfers Overall transfer level: Needs assistance Equipment used: Rolling walker (2 wheeled) Transfers: Sit to/from Stand Sit to Stand: Supervision            Ambulation/Gait Ambulation/Gait assistance: Supervision Gait Distance (Feet): 165 Feet Assistive device: Rolling walker (2 wheeled) Gait Pattern/deviations: Step-through pattern;Decreased stride length Gait velocity: decreased   General Gait Details: decreased heel strike LLE, decreased L hip/knee flexion during swing phase   Stairs             Wheelchair Mobility    Modified Rankin (Stroke Patients Only)       Balance Overall balance assessment: Needs assistance Sitting-balance support: Feet supported Sitting balance-Leahy Scale: Good     Standing balance support: Bilateral upper extremity supported;During functional activity Standing balance-Leahy Scale: Good                               Cognition Arousal/Alertness: Awake/alert Behavior During Therapy: WFL for tasks assessed/performed Overall Cognitive Status: Within Functional Limits for tasks assessed                                        Exercises Total Joint Exercises Quad Sets: AROM;Left;10 reps Heel Slides: AAROM;Left;10 reps Long Arc Quad: AROM;Left;10 reps;Seated Goniometric ROM: L knee 10 degrees-75 degrees    General Comments        Pertinent Vitals/Pain Pain Assessment: Faces Pain Score: 2  Pain Location: L knee Pain Descriptors / Indicators: Sore Pain Intervention(s): Limited activity within patient's tolerance;Monitored during session    Home Living                      Prior Function            PT Goals (current goals can now be found in the care plan section) Acute Rehab PT Goals Patient Stated Goal: to go home PT Goal Formulation: With patient Time For Goal Achievement: 11/25/20 Potential to Achieve Goals: Good Additional Goals Additional Goal #1: Pt will be able to perform bed mobility/transfers with mod I and safe technique in order to improve functional independence Progress towards PT goals: Progressing toward goals    Frequency    BID      PT Plan Current plan  remains appropriate    Co-evaluation              AM-PAC PT "6 Clicks" Mobility   Outcome Measure  Help needed turning from your back to your side while in a flat bed without using bedrails?: None Help needed moving from lying on your back to sitting on the side of a flat bed without using bedrails?: A Little Help needed moving to and from a bed to a chair (including a wheelchair)?: A Little Help needed standing up from a chair using your arms (e.g., wheelchair or bedside chair)?: A Little Help needed to walk in hospital room?: A Little Help needed climbing 3-5 steps with a railing? : A Little 6 Click Score: 19    End of Session Equipment Utilized During Treatment: Gait  belt Activity Tolerance: Patient tolerated treatment well Patient left: in chair;with call bell/phone within reach;with chair alarm set   PT Visit Diagnosis: Muscle weakness (generalized) (M62.81);Difficulty in walking, not elsewhere classified (R26.2);Pain Pain - Right/Left: Left Pain - part of body: Knee     Time: 0921-0939 PT Time Calculation (min) (ACUTE ONLY): 18 min  Charges:  $Therapeutic Activity: 8-22 mins                     Lavone Nian, PT, DPT 11/14/20, 9:46 AM    Waunita Schooner 11/14/2020, 9:45 AM

## 2020-11-14 NOTE — Progress Notes (Addendum)
Physical Therapy Treatment Patient Details Name: Henry [name] MRN: [id number] DOB: Sep 04, 1945 Today's Date: 11/14/2020    History of Present Illness Pt admitted for L TKR (6/28). History includes GERD, HLD, and HTN.    PT Comments    Pt seen for PT tx & pt agreeable. Pt is able to complete bed mobility with supervision with HOB elevated, bed rails, and extra time to elevate LLE onto bed during sit>supine. Pt completes transfers & gait to gym & back with RW & supervision with improving heel strike LLE. Pt negotiates 2 steps backwards with RW & CGA, demonstrating appropriate gait pattern. Encouraged pt to perform BLE strengthening exercises throughout the day. PT educated pt & wife on how to don LLE KI & demonstrated to them; educated them on purpose of KI.     Follow Up Recommendations  Home health PT;Supervision for mobility/OOB     Equipment Recommendations  Rolling walker with 5" wheels    Recommendations for Other Services       Precautions / Restrictions Precautions Precautions: Fall;Knee  RLE WBAT   Mobility  Bed Mobility Overal bed mobility: Needs Assistance Bed Mobility: Supine to Sit;Sit to Supine     Supine to sit: Supervision;HOB elevated Sit to supine: Supervision;HOB elevated   General bed mobility comments: extra time to elevate LLE onto bed, encouragement to attempt task without PT assisting him    Transfers Overall transfer level: Needs assistance Equipment used: Rolling walker (2 wheeled) Transfers: Sit to/from Stand Sit to Stand: Supervision;From elevated surface         General transfer comment: LLE not underneath BOS 2/2 limited flexion in sitting  Ambulation/Gait Ambulation/Gait assistance: Supervision Gait Distance (Feet): 165 Feet Assistive device: Rolling walker (2 wheeled) Gait Pattern/deviations: Step-through pattern;Decreased stride length Gait velocity: decreased   General Gait Details: improving heel strike LLE, heavy reliance on  RW   Stairs Stairs: Yes Stairs assistance: Min guard Stair Management: No rails;Backwards;With walker Number of Stairs: 2 General stair comments: Pt able to demonstrate & verbalize proper compensatory technique   Wheelchair Mobility    Modified Rankin (Stroke Patients Only)       Balance Overall balance assessment: Needs assistance Sitting-balance support: Feet supported Sitting balance-Leahy Scale: Good     Standing balance support: Bilateral upper extremity supported;During functional activity Standing balance-Leahy Scale: Good                              Cognition Arousal/Alertness: Awake/alert Behavior During Therapy: WFL for tasks assessed/performed Overall Cognitive Status: Within Functional Limits for tasks assessed                                        Exercises Total Joint Exercises Heel Slides: AAROM;Left;10 reps;Supine    General Comments        Pertinent Vitals/Pain Pain Assessment: 0-10 Pain Score: 2  (2/10 with mobility/gait, 8/10 when flexing knee during sit<>stand) Pain Location: L knee Pain Descriptors / Indicators: Sore Pain Intervention(s): Monitored during session;Repositioned    Home Living                      Prior Function            PT Goals (current goals can now be found in the care plan section) Acute Rehab PT Goals Patient Stated Goal: to go  home PT Goal Formulation: With patient Time For Goal Achievement: 11/25/20 Potential to Achieve Goals: Good Additional Goals Additional Goal #1: Pt will be able to perform bed mobility/transfers with mod I and safe technique in order to improve functional independence Progress towards PT goals: Progressing toward goals    Frequency    BID      PT Plan Current plan remains appropriate    Co-evaluation              AM-PAC PT "6 Clicks" Mobility   Outcome Measure  Help needed turning from your back to your side while in a flat  bed without using bedrails?: None Help needed moving from lying on your back to sitting on the side of a flat bed without using bedrails?: A Little Help needed moving to and from a bed to a chair (including a wheelchair)?: A Little Help needed standing up from a chair using your arms (e.g., wheelchair or bedside chair)?: A Little Help needed to walk in hospital room?: A Little Help needed climbing 3-5 steps with a railing? : A Little 6 Click Score: 19    End of Session Equipment Utilized During Treatment: Gait belt Activity Tolerance: Patient tolerated treatment well Patient left: in bed;with call bell/phone within reach;with bed alarm set;with family/visitor present (RLE SCD donned, LLE KI & polar care donned) Nurse Communication: Mobility status (educated nursing staff on pt's ability to ambulate with them as time allows) PT Visit Diagnosis: Muscle weakness (generalized) (M62.81);Difficulty in walking, not elsewhere classified (R26.2);Pain Pain - Right/Left: Left Pain - part of body: Knee     Time: 4128-2081 PT Time Calculation (min) (ACUTE ONLY): 17 min  Charges:  $Gait Training: 8-22 mins                     Lavone Nian, PT, DPT 11/14/20, 4:34 PM    Waunita Schooner 11/14/2020, 3:34 PM

## 2020-11-14 NOTE — Progress Notes (Signed)
Physician Discharge Summary  Patient ID: Henry [name] MRN: [id number] DOB/AGE: 1945-07-12 75 y.o.  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 male who was admitted 11/11/2020 with a diagnosis of  Left Knee Osteoarthritis <principal problem not specified> and went to the operating room on 11/11/2020 and underwent an uncomplicated left total knee arthroplasty.    He was given perioperative antibiotics:  Anti-infectives (From admission, onward)    Start     Dose/Rate Route Frequency Ordered Stop   11/11/20 1630  ceFAZolin (ANCEF) IVPB 2g/100 mL premix        2 g 200 mL/hr over 30 Minutes Intravenous Every 6 hours 11/11/20 1229 11/11/20 2122   11/11/20 0702  clindamycin (CLEOCIN) 600 MG/50ML IVPB       Note to Pharmacy: Doreen Salvage   : cabinet override      11/11/20 0702 11/11/20 1914   11/11/20 0617  clindamycin (CLEOCIN) 900 MG/50ML IVPB       Note to Pharmacy: Doreen Salvage   : cabinet override      11/11/20 0617 11/11/20 0658   11/11/20 0617  ceFAZolin (ANCEF) 2-4 GM/100ML-% IVPB       Note to Pharmacy: Doreen Salvage   : cabinet override      11/11/20 0617 11/11/20 0820   11/11/20 0600  ceFAZolin (ANCEF) IVPB 2g/100 mL premix        2 g 200 mL/hr over 30 Minutes Intravenous On call to O.R. 11/11/20 0226 11/11/20 1106   11/11/20 0230  clindamycin  (CLEOCIN) IVPB 600 mg        600 mg 100 mL/hr over 30 Minutes Intravenous  Once 11/11/20 0226 11/11/20 0732     .  He was given sequential compression devices, early ambulation, and Lovenox for DVT prophylaxis.  Patient's postop course was complicated by an ileus.  GI consultation was obtained.  Patient was treated with medical management and improved.  Given his clinical improvement he is cleared for discharge home on postop day #4.  He benefited maximally from the hospital stay and there were no complications.    Recent vital signs:  Vitals:   11/14/20 0805 11/14/20 1130  BP: 136/86 136/85  Pulse: 97 96  Resp: 16 17  Temp: 99 F (37.2 C) 98.8 F (37.1 C)  SpO2: 97% 99%    Recent laboratory studies:  Lab Results  Component Value Date   HGB 11.6 (L) 11/13/2020   HGB 12.1 (L) 11/12/2020   HGB 14.1 11/03/2020   Lab Results  Component Value Date   WBC 12.0 (H) 11/13/2020   PLT 169 11/13/2020   Lab Results  Component Value Date   INR 1.0 11/03/2020   Lab Results  Component Value Date   NA 137 11/14/2020   K 3.8 11/14/2020   CL 104 11/14/2020   CO2 27  11/14/2020   BUN 18 11/14/2020   CREATININE 1.38 (H) 11/14/2020   GLUCOSE 93 11/14/2020    Discharge Medications:   Allergies as of 11/14/2020       Reactions   Codeine Other (See Comments)   Gi upset   Lovastatin Other (See Comments)   Joint pain   Pravastatin Nausea And Vomiting   joint ache   Pravastatin Sodium    joint ache   Simvastatin Other (See Comments)   muscle ache   Statins    Sulfa Antibiotics Rash, Itching        Medication List     STOP taking these medications    ibuprofen 200 MG tablet Commonly known as: ADVIL   TURMERIC PO       TAKE these medications    acetaminophen 325 MG tablet Commonly known as: TYLENOL Take 1-2 tablets (325-650 mg total) by mouth every 6 (six) hours as needed for mild pain (pain score 1-3 or temp > 100.5).   amLODipine 5 MG tablet Commonly known  as: NORVASC TAKE ONE TABLET BY MOUTH DAILY   aspirin EC 81 MG tablet Take 4 tablets (325 mg total) by mouth 2 (two) times daily. What changed:  how much to take when to take this   bisacodyl 5 MG EC tablet Commonly known as: DULCOLAX Take 1 tablet (5 mg total) by mouth daily as needed for moderate constipation.   cetirizine 10 MG tablet Commonly known as: ZYRTEC Take 10 mg by mouth daily.   clotrimazole-betamethasone cream Commonly known as: LOTRISONE Apply 1 application topically daily as needed (Rash).   docusate sodium 50 MG capsule Commonly known as: COLACE Take 50 mg by mouth daily.   lisinopril 40 MG tablet Commonly known as: ZESTRIL TAKE ONE TABLET BY MOUTH DAILY   oxyCODONE 5 MG immediate release tablet Commonly known as: Oxy IR/ROXICODONE Take 1 tablet (5 mg total) by mouth every 4 (four) hours as needed for moderate pain (pain score 4-6).   phenylephrine-shark liver oil-mineral oil-petrolatum 0.25-14-74.9 % rectal ointment Commonly known as: PREPARATION H Place 1 application rectally 2 (two) times daily as needed for hemorrhoids.   Vitamin D 50 MCG (2000 UT) Caps Take 2,000 Units by mouth daily.   ZINC GLUCONATE PO Take by mouth daily. Immune Health       ASK your doctor about these medications    omeprazole 20 MG capsule Commonly known as: PRILOSEC TAKE ONE CAPSULE BY MOUTH DAILY               Durable Medical Equipment  (From admission, onward)           Start     Ordered   11/12/20 1351  For home use only DME Walker rolling  Once       Question Answer Comment  Walker: With 5 Inch Wheels   Patient needs a walker to treat with the following condition Weakness      11/12/20 1350            Diagnostic Studies: Chest 2 View  Result Date: 11/04/2020 CLINICAL DATA:  Preoperative study prior to knee replacement. EXAM: CHEST - 2 VIEW COMPARISON:  December 29, 2016 FINDINGS: The heart size and mediastinal contours are within normal  limits. Both lungs are clear. The visualized skeletal structures are unremarkable. IMPRESSION: No active cardiopulmonary disease. Electronically Signed   By: Dorise Bullion III M.D   On: 11/04/2020 07:04   DG Abd 1 View  Result Date: 11/13/2020 CLINICAL  DATA:  Nausea and vomiting following knee replacement surgery 2 days ago, initial encounter EXAM: ABDOMEN - 1 VIEW COMPARISON:  None. FINDINGS: Scattered large and small bowel gas is noted likely representing a postoperative ileus. No free air is seen. No abnormal mass or abnormal calcifications are noted. IMPRESSION: Changes most likely related to a postoperative ileus. Electronically Signed   By: Inez Catalina M.D.   On: 11/13/2020 14:50   DG Knee Left Port  Result Date: 11/11/2020 CLINICAL DATA:  Status post left knee arthroplasty. EXAM: PORTABLE LEFT KNEE - 1-2 VIEW COMPARISON:  No recent. FINDINGS: Total left knee replacement.  Hardware intact.  Anatomic alignment. IMPRESSION: Total left knee replacement with anatomic alignment. Electronically Signed   By: Marcello Moores  Register   On: 11/11/2020 13:13    Disposition: Discharge disposition: 01-Home or Self Care       Discharge Instructions     Call MD / Call 911   Complete by: As directed    If you experience chest pain or shortness of breath, CALL 911 and be transported to the hospital emergency room.  If you develope a fever above 101 F, pus (white drainage) or increased drainage or redness at the wound, or calf pain, call your surgeon's office.   Constipation Prevention   Complete by: As directed    Drink plenty of fluids.  Prune juice may be helpful.  You may use a stool softener, such as Colace (over the counter) 100 mg twice a day.  Use MiraLax (over the counter) for constipation as needed.   Diet general   Complete by: As directed    Discharge instructions   Complete by: As directed    The patient may continue to bear weight on the left lower extremity with use of a walker. The  patient should continue to use TED stockings until follow-up. Patient should remove the TED stockings at night for sleep. The patient needs to continue to elevate the left lower extremity whenever possible. The knee immobilizer should be used at night. The patient may remove the knee immobilizer to perform exercises or sit in a chair during the day.  Patient should not place a pillow under their knee. The Polar Care may be used by the patient for comfort.  The dressing should remain on until follow up in the office.   The patient must cover the left knee dressing/incision during showers with a plastic bag or Saran wrap.  The patient will take aspirin 325 mg BID a day for blood clot prevention and continue to work on knee range of motion exercises at home as instructed by physical therapy until follow-up in the office.   Driving restrictions   Complete by: As directed    No driving follow up at [facility] office on  11-26-2020 10:30 AM with Roland Rack, PA-C   Increase activity slowly as tolerated   Complete by: As directed    Lifting restrictions   Complete by: As directed    No lifting for 12-16 weeks   Post-operative opioid taper instructions:   Complete by: As directed    POST-OPERATIVE OPIOID TAPER INSTRUCTIONS: It is important to wean off of your opioid medication as soon as possible. If you do not need pain medication after your surgery it is ok to stop day one. Opioids include: Codeine, Hydrocodone(Norco, Vicodin), Oxycodone(Percocet, oxycontin) and hydromorphone amongst others.  Long term and even short term use of opiods can cause: Increased pain response Dependence Constipation Depression Respiratory depression And more.  Withdrawal symptoms can include Flu like symptoms Nausea, vomiting And more Techniques to manage these symptoms Hydrate well Eat regular healthy meals Stay active Use relaxation techniques(deep breathing, meditating, yoga) Do Not substitute Alcohol to help  with tapering If you have been on opioids for less than two weeks and do not have pain than it is ok to stop all together.  Plan to wean off of opioids This plan should start within one week post op of your joint replacement. Maintain the same interval or time between taking each dose and first decrease the dose.  Cut the total daily intake of opioids by one tablet each day Next start to increase the time between doses. The last dose that should be eliminated is the evening dose.             Signed: Thornton Park ,MD 11/14/2020, 1:39 PM

## 2020-11-14 NOTE — Progress Notes (Signed)
Subjective:  POD #3 s/p left total knee arthroplasty.   Postop course complicated by ileus.  Patient is feeling better today.  He is passing frequent flatus and had 2 small watery stools.  Patient states his abdominal distention has resolved.  Patient reports left knee pain as minimal.  Patient is tolerating a liquid diet and was able to get up with physical therapy today.  Objective:   VITALS:   Vitals:   11/14/20 0025 11/14/20 0444 11/14/20 0805 11/14/20 1130  BP: 126/72 126/78 136/86 136/85  Pulse: 86 91 97 96  Resp: 18 18 16 17   Temp: 98.2 F (36.8 C) 98.6 F (37 C) 99 F (37.2 C) 98.8 F (37.1 C)  TempSrc: Oral Oral Oral Oral  SpO2: 96% 95% 97% 99%  Weight:      Height:        PHYSICAL EXAM: Left lower extremity Neurovascular intact Sensation intact distally Intact pulses distally Dorsiflexion/Plantar flexion intact Incision: dressing C/D/I No cellulitis present Compartment soft  LABS  Results for orders placed or performed during the hospital encounter of 11/11/20 (from the past 24 hour(s))  Comprehensive metabolic panel     Status: Abnormal   Collection Time: 11/13/20  1:52 PM  Result Value Ref Range   Sodium 136 135 - 145 mmol/L   Potassium 4.1 3.5 - 5.1 mmol/L   Chloride 103 98 - 111 mmol/L   CO2 26 22 - 32 mmol/L   Glucose, Bld 120 (H) 70 - 99 mg/dL   BUN 16 8 - 23 mg/dL   Creatinine, Ser 1.36 (H) 0.61 - 1.24 mg/dL   Calcium 9.1 8.9 - 10.3 mg/dL   Total Protein 6.6 6.5 - 8.1 g/dL   Albumin 3.5 3.5 - 5.0 g/dL   AST 17 15 - 41 U/L   ALT 10 0 - 44 U/L   Alkaline Phosphatase 52 38 - 126 U/L   Total Bilirubin 1.1 0.3 - 1.2 mg/dL   GFR, Estimated 55 (L) >60 mL/min   Anion gap 7 5 - 15  Magnesium     Status: None   Collection Time: 11/13/20  1:52 PM  Result Value Ref Range   Magnesium 1.8 1.7 - 2.4 mg/dL  Basic metabolic panel     Status: Abnormal   Collection Time: 11/14/20  4:50 AM  Result Value Ref Range   Sodium 137 135 - 145 mmol/L   Potassium  3.8 3.5 - 5.1 mmol/L   Chloride 104 98 - 111 mmol/L   CO2 27 22 - 32 mmol/L   Glucose, Bld 93 70 - 99 mg/dL   BUN 18 8 - 23 mg/dL   Creatinine, Ser 1.38 (H) 0.61 - 1.24 mg/dL   Calcium 8.4 (L) 8.9 - 10.3 mg/dL   GFR, Estimated 54 (L) >60 mL/min   Anion gap 6 5 - 15    DG Abd 1 View  Result Date: 11/13/2020 CLINICAL DATA:  Nausea and vomiting following knee replacement surgery 2 days ago, initial encounter EXAM: ABDOMEN - 1 VIEW COMPARISON:  None. FINDINGS: Scattered large and small bowel gas is noted likely representing a postoperative ileus. No free air is seen. No abnormal mass or abnormal calcifications are noted. IMPRESSION: Changes most likely related to a postoperative ileus. Electronically Signed   By: Inez Catalina M.D.   On: 11/13/2020 14:50    Assessment/Plan: 3 Days Post-Op   Active Problems:   S/P TKR (total knee replacement) using cement, left  Appreciate GI consultation.  KUB yesterday showed  ileus pattern but the patient is clinically improving today.  He is tolerating a liquid diet and passing flatus and had 2 small stools.  Patient will continue with physical therapy.  Patient will use oxycodone for pain judiciously.  Tramadol did not provide any pain relief for him.  Continue Lovenox for DVT prophylaxis until discharge and then the patient will begin enteric-coated aspirin 325 mg p.o. twice daily for DVT prophylaxis until follow-up in the office in 1 to 2 weeks.    Thornton Park , MD 11/14/2020, 1:17 PM

## 2020-11-15 NOTE — Progress Notes (Signed)
Physical Therapy Treatment Patient Details Name: Henry [name] MRN: [id number] DOB: 1945/07/04 Today's Date: 11/15/2020    History of Present Illness Pt admitted for L TKR (6/28). History includes GERD, HLD, and HTN.    PT Comments    Pt received supine in bed agreeable to PT. Pt has no c/o L knee pain but reporting L MTP joint pain on L great toe and appears swollen and red compared to R great toe. Linton Rump, ortho PA is aware. Otherwise pt supervision of supine to sitting EOB with HOB elevated and use of bed rails up to RW. Pt amb 170' with supervision and demo excellent technique asc stairs backwards with RW with [place]inguard for safety. Pt returned to supine in bed. Pt still continuing to lack terminal knee extension and regressing in knee flexion. PT educated on importance of heel slides, quad sets, use of KI and towel roll under heel to promote improved AROM of L knee. Pt re-educated on donning of KI prior to ending session verbalizing understanding of all PT ed. Current D/c recs remain appropriate.     Follow Up Recommendations  Home health PT;Supervision for mobility/OOB     Equipment Recommendations  Rolling walker with 5" wheels    Recommendations for Other Services       Precautions / Restrictions Precautions Precautions: Knee;Fall Precaution Booklet Issued: Yes (comment)    Mobility  Bed Mobility Overal bed mobility: Needs Assistance Bed Mobility: Supine to Sit;Sit to Supine     Supine to sit: Supervision;HOB elevated Sit to supine: Supervision;HOB elevated        Transfers Overall transfer level: Needs assistance Equipment used: Rolling walker (2 wheeled) Transfers: Sit to/from Stand Sit to Stand: Supervision            Ambulation/Gait Ambulation/Gait assistance: Supervision Gait Distance (Feet): 170 Feet Assistive device: Rolling walker (2 wheeled) Gait Pattern/deviations: Step-through pattern;Decreased stride length Gait velocity: decreased    General Gait Details: Continued heavy reliance on RW with UE's   Stairs   Stairs assistance: Min guard Stair Management: No rails;Backwards;With walker Number of Stairs: 2 General stair comments: Pt able to demonstrate & verbalize proper compensatory technique   Wheelchair Mobility    Modified Rankin (Stroke Patients Only)       Balance Overall balance assessment: Needs assistance Sitting-balance support: Feet supported Sitting balance-Leahy Scale: Good     Standing balance support: Bilateral upper extremity supported;During functional activity Standing balance-Leahy Scale: Good Standing balance comment: ABle to sequence RW with side stepping in narrow space in room                            Cognition Arousal/Alertness: Awake/alert Behavior During Therapy: WFL for tasks assessed/performed Overall Cognitive Status: Within Functional Limits for tasks assessed                                        Exercises Total Joint Exercises Quad Sets: AROM;Left;10 reps;Supine Heel Slides: AROM;Left;10 reps;Supine Hip ABduction/ADduction: AROM;Left;Supine;10 reps Long Arc Quad: AROM;Left;10 reps;Seated Goniometric ROM: 6-55    General Comments        Pertinent Vitals/Pain Pain Assessment: No/denies pain Pain Location: No pain in knee, just soreness. Reports L great toe pain Pain Descriptors / Indicators: Sore;Tender Pain Intervention(s): Limited activity within patient's tolerance;Monitored during session;Repositioned;Ice applied    Home Living  Prior Function            PT Goals (current goals can now be found in the care plan section) Acute Rehab PT Goals Patient Stated Goal: to go home PT Goal Formulation: With patient Time For Goal Achievement: 11/25/20 Potential to Achieve Goals: Good Progress towards PT goals: Progressing toward goals    Frequency    BID      PT Plan Current plan remains  appropriate    Co-evaluation              AM-PAC PT "6 Clicks" Mobility   Outcome Measure  Help needed turning from your back to your side while in a flat bed without using bedrails?: None Help needed moving from lying on your back to sitting on the side of a flat bed without using bedrails?: A Little Help needed moving to and from a bed to a chair (including a wheelchair)?: A Little Help needed standing up from a chair using your arms (e.g., wheelchair or bedside chair)?: A Little Help needed to walk in hospital room?: A Little Help needed climbing 3-5 steps with a railing? : A Little 6 Click Score: 19    End of Session Equipment Utilized During Treatment: Gait belt Activity Tolerance: Patient tolerated treatment well Patient left: in bed;with call bell/phone within reach;with bed alarm set;with family/visitor present   PT Visit Diagnosis: Muscle weakness (generalized) (M62.81);Difficulty in walking, not elsewhere classified (R26.2);Pain Pain - Right/Left: Left Pain - part of body: Knee     Time: 3244-0102 PT Time Calculation (min) (ACUTE ONLY): 31 min  Charges:  $Therapeutic Exercise: 8-22 mins $Therapeutic Activity: 8-22 mins                     Korrie Hofbauer M. Fairly IV, PT, DPT Physical Therapist- [facility]  11/15/2020, 12:58 PM

## 2020-11-15 NOTE — Progress Notes (Addendum)
  Subjective:  Patient reports pain as mild to moderate.    Objective:   VITALS:   Vitals:   11/14/20 1751 11/14/20 2034 11/15/20 0615 11/15/20 0817  BP: (!) 142/76 130/87 135/72 138/78  Pulse: 99 97 87 91  Resp: 16 18 18 20   Temp: 99 F (37.2 C) 99.2 F (37.3 C) 98.4 F (36.9 C) 99.7 F (37.6 C)  TempSrc:  Oral Oral   SpO2: 96% 95% 96% 97%  Weight:      Height:        PHYSICAL EXAM:  Neurologically intact ABD soft Neurovascular intact Sensation intact distally Intact pulses distally Dorsiflexion/Plantar flexion intact Incision: dressing C/D/I No cellulitis present Compartment soft  LABS  No results found for this or any previous visit (from the past 24 hour(s)).  DG Abd 1 View  Result Date: 11/13/2020 CLINICAL DATA:  Nausea and vomiting following knee replacement surgery 2 days ago, initial encounter EXAM: ABDOMEN - 1 VIEW COMPARISON:  None. FINDINGS: Scattered large and small bowel gas is noted likely representing a postoperative ileus. No free air is seen. No abnormal mass or abnormal calcifications are noted. IMPRESSION: Changes most likely related to a postoperative ileus. Electronically Signed   By: Inez Catalina M.D.   On: 11/13/2020 14:50    Assessment/Plan: 4 Days Post-Op   Active Problems:   S/P TKR (total knee replacement) using cement, left   History of total knee arthroplasty   Advance diet Up with therapy  Appreciate GI consultation.   KUB yesterday showed ileus pattern but the patient is clinically improving today.   He is tolerating a liquid diet and passing flatus and had 2 small stools.   Patient will continue with physical therapy.   Patient will use oxycodone for pain judiciously.   Tramadol did not provide any pain relief for him.   Continue Lovenox for DVT prophylaxis until discharge and then the patient will begin enteric-coated aspirin 325 mg p.o. twice daily for DVT prophylaxis until follow-up in the office in 1 to 2 weeks.   May  discharge home when tolerating regular diet per GI  Henry [name] , PA-C 11/15/2020, 10:36 AM

## 2020-11-15 NOTE — TOC Transition Note (Signed)
Transition of Care [facility]) - CM/SW Discharge Note   Patient Details  Name: Henry [name] MRN: [id number] Date of Birth: Jun 04, 1945  Transition of Care [facility]) CM/SW Contact:  Izola Price, RN Phone Number: 11/15/2020, 12:29 PM   Clinical Narrative:  Patient to be discharged home with [facility] services via [facility] and confirmed by [facility]. Spoke with patient and all DME delivered and taken home by spouse. Transportation home via spouse. No other questions or concerns by patient. Simmie Davies RN CM      Final next level of care: Home w Home Health Services Barriers to Discharge: Barriers Resolved   Patient Goals and CMS Choice        Discharge Placement                       Discharge Plan and Services                DME Arranged:  (Patient states it was delivered and wife took to the home.) DME Agency: [employer] Date DME Agency Contacted: 11/12/20 Time DME Agency Contacted:  (Via CM on 6/29) Representative spoke with at DME Agency: Suanne Marker per CM notes. HH Arranged: PT HH Agency: [place] Date HH Agency Contacted: 11/15/20 Time [place]: 1228 Representative spoke with at [place]: [facility]  Social Determinants of Health ([place]) Interventions     Readmission Risk Interventions No flowsheet data found.

## 2020-11-16 NOTE — Discharge Summary (Signed)
Henry Park, MD  Physician  Orthopedics  Progress Notes     Signed  Date of Service:  11/14/2020  1:38 PM           Signed                                                                                                                                                                                                                                                                                                                                                                                                                  Physician Discharge Summary  Patient ID: Henry [name] MRN: [id number] DOB/AGE: February 04, 1946 75 y.o.   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 male who was  admitted 11/11/2020 with a diagnosis of  Left Knee Osteoarthritis <principal problem not specified> and went to the operating room on 11/11/2020 and underwent an uncomplicated left total knee arthroplasty.     He was given perioperative antibiotics: Anti-infectives (From admission, onward)      Start     Dose/Rate Route Frequency Ordered Stop    11/11/20 1630   ceFAZolin (ANCEF) IVPB 2g/100 mL premix        2 g 200 mL/hr over 30  Minutes Intravenous Every 6 hours 11/11/20 1229 11/11/20 2122    11/11/20 0702   clindamycin (CLEOCIN) 600 MG/50ML IVPB       Note to Pharmacy: Doreen Salvage   : cabinet override         11/11/20 0702 11/11/20 1914    11/11/20 0617   clindamycin (CLEOCIN) 900 MG/50ML IVPB       Note to Pharmacy: Doreen Salvage   : cabinet override         11/11/20 0617 11/11/20 0658    11/11/20 0617   ceFAZolin (ANCEF) 2-4 GM/100ML-% IVPB       Note to Pharmacy: Doreen Salvage   : cabinet override         11/11/20 0617 11/11/20 0820    11/11/20 0600   ceFAZolin (ANCEF) IVPB 2g/100 mL premix        2 g 200 mL/hr over 30 Minutes Intravenous On call to O.R. 11/11/20 0226 11/11/20 1106    11/11/20 0230   clindamycin (CLEOCIN) IVPB 600 mg        600 mg 100 mL/hr over 30 Minutes Intravenous  Once 11/11/20 0226 11/11/20 0732       .   He was given sequential compression devices, early ambulation, and Lovenox for DVT prophylaxis.   Patient's postop course was complicated by an ileus.  GI consultation was obtained.  Patient was treated with medical management and improved.  Given his clinical improvement he is cleared for discharge home on postop day #4.   He benefited maximally from the hospital stay and there were no complications.     Recent vital signs:      Vitals:    11/14/20 0805 11/14/20 1130  BP: 136/86 136/85  Pulse: 97 96  Resp: 16 17  Temp: 99 F (37.2 C) 98.8 F (37.1 C)  SpO2: 97% 99%      Recent laboratory studies:       Lab Results  Component Value Date    HGB 11.6 (L) 11/13/2020    HGB 12.1 (L) 11/12/2020    HGB 14.1 11/03/2020         Lab Results  Component Value Date    WBC 12.0 (H) 11/13/2020    PLT 169 11/13/2020         Lab Results  Component Value Date    INR 1.0 11/03/2020         Lab Results  Component Value Date    NA 137 11/14/2020    K 3.8 11/14/2020    CL 104 11/14/2020    CO2 27 11/14/2020    BUN 18 11/14/2020    CREATININE 1.38 (H) 11/14/2020     GLUCOSE 93 11/14/2020      Discharge Medications:   Allergies as of 11/14/2020         Reactions    Codeine Other (See Comments)    Gi upset    Lovastatin Other (See Comments)    Joint pain    Pravastatin Nausea  And Vomiting    joint ache    Pravastatin Sodium      joint ache    Simvastatin Other (See Comments)    muscle ache    Statins      Sulfa Antibiotics Rash, Itching            Medication List       STOP taking these medications     ibuprofen 200 MG tablet Commonly known as: ADVIL    TURMERIC PO           TAKE these medications     acetaminophen 325 MG tablet Commonly known as: TYLENOL Take 1-2 tablets (325-650 mg total) by mouth every 6 (six) hours as needed for mild pain (pain score 1-3 or temp > 100.5).    amLODipine 5 MG tablet Commonly known as: NORVASC TAKE ONE TABLET BY MOUTH DAILY    aspirin EC 81 MG tablet Take 4 tablets (325 mg total) by mouth 2 (two) times daily. What changed: how much to take when to take this    bisacodyl 5 MG EC tablet Commonly known as: DULCOLAX Take 1 tablet (5 mg total) by mouth daily as needed for moderate constipation.    cetirizine 10 MG tablet Commonly known as: ZYRTEC Take 10 mg by mouth daily.    clotrimazole-betamethasone cream Commonly known as: LOTRISONE Apply 1 application topically daily as needed (Rash).    docusate sodium 50 MG capsule Commonly known as: COLACE Take 50 mg by mouth daily.    lisinopril 40 MG tablet Commonly known as: ZESTRIL TAKE ONE TABLET BY MOUTH DAILY    oxyCODONE 5 MG immediate release tablet Commonly known as: Oxy IR/ROXICODONE Take 1 tablet (5 mg total) by mouth every 4 (four) hours as needed for moderate pain (pain score 4-6).    phenylephrine-shark liver oil-mineral oil-petrolatum 0.25-14-74.9 % rectal ointment Commonly known as: PREPARATION H Place 1 application rectally 2 (two) times daily as needed for hemorrhoids.    Vitamin D 50 MCG (2000 UT) Caps Take  2,000 Units by mouth daily.    ZINC GLUCONATE PO Take by mouth daily. Immune Health           ASK your doctor about these medications     omeprazole 20 MG capsule Commonly known as: PRILOSEC TAKE ONE CAPSULE BY MOUTH DAILY                        Durable Medical Equipment  (From admission, onward)                 Start     Ordered    11/12/20 1351   For home use only DME Walker rolling  Once       Question Answer Comment  Walker: With 5 Inch Wheels    Patient needs a walker to treat with the following condition Weakness       11/12/20 1350                  Diagnostic Studies: Chest 2 View   Result Date: 11/04/2020 CLINICAL DATA:  Preoperative study prior to knee replacement. EXAM: CHEST - 2 VIEW COMPARISON:  December 29, 2016 FINDINGS: The heart size and mediastinal contours are within normal limits. Both lungs are clear. The visualized skeletal structures are unremarkable. IMPRESSION: No active cardiopulmonary disease. Electronically Signed   By: Dorise Bullion III M.D   On: 11/04/2020 07:04   DG Abd 1 View  Result Date: 11/13/2020 CLINICAL DATA:  Nausea and vomiting following knee replacement surgery 2 days ago, initial encounter EXAM: ABDOMEN - 1 VIEW COMPARISON:  None. FINDINGS: Scattered large and small bowel gas is noted likely representing a postoperative ileus. No free air is seen. No abnormal mass or abnormal calcifications are noted. IMPRESSION: Changes most likely related to a postoperative ileus. Electronically Signed   By: Inez Catalina M.D.   On: 11/13/2020 14:50   DG Knee Left Port   Result Date: 11/11/2020 CLINICAL DATA:  Status post left knee arthroplasty. EXAM: PORTABLE LEFT KNEE - 1-2 VIEW COMPARISON:  No recent. FINDINGS: Total left knee replacement.  Hardware intact.  Anatomic alignment. IMPRESSION: Total left knee replacement with anatomic alignment. Electronically Signed   By: Marcello Moores  Register   On: 11/11/2020 13:13     Disposition:  Discharge disposition: 01-Home or Self Care             Discharge Instructions       Call MD / Call 911   Complete by: As directed      If you experience chest pain or shortness of breath, CALL 911 and be transported to the hospital emergency room.  If you develope a fever above 101 F, pus (white drainage) or increased drainage or redness at the wound, or calf pain, call your surgeon's office.    Constipation Prevention   Complete by: As directed      Drink plenty of fluids.  Prune juice may be helpful.  You may use a stool softener, such as Colace (over the counter) 100 mg twice a day.  Use MiraLax (over the counter) for constipation as needed.    Diet general   Complete by: As directed      Discharge instructions   Complete by: As directed      The patient may continue to bear weight on the left lower extremity with use of a walker. The patient should continue to use TED stockings until follow-up. Patient should remove the TED stockings at night for sleep. The patient needs to continue to elevate the left lower extremity whenever possible. The knee immobilizer should be used at night. The patient may remove the knee immobilizer to perform exercises or sit in a chair during the day.  Patient should not place a pillow under their knee. The Polar Care may be used by the patient for comfort.  The dressing should remain on until follow up in the office.   The patient must cover the left knee dressing/incision during showers with a plastic bag or Saran wrap.  The patient will take aspirin 325 mg BID a day for blood clot prevention and continue to work on knee range of motion exercises at home as instructed by physical therapy until follow-up in the office.    Driving restrictions   Complete by: As directed      No driving follow up at [facility] office on  11-26-2020 10:30 AM with Roland Rack, PA-C    Increase activity slowly as tolerated   Complete by: As directed      Lifting restrictions    Complete by: As directed      No lifting for 12-16 weeks    Post-operative opioid taper instructions:   Complete by: As directed      POST-OPERATIVE OPIOID TAPER INSTRUCTIONS: It is important to wean off of your opioid medication as soon as possible. If you do not need pain medication after your surgery it is ok to  stop day one. Opioids include: Codeine, Hydrocodone(Norco, Vicodin), Oxycodone(Percocet, oxycontin) and hydromorphone amongst others. Long term and even short term use of opiods can cause: Increased pain response Dependence Constipation Depression Respiratory depression And more. Withdrawal symptoms can include Flu like symptoms Nausea, vomiting And more Techniques to manage these symptoms Hydrate well Eat regular healthy meals Stay active Use relaxation techniques(deep breathing, meditating, yoga) Do Not substitute Alcohol to help with tapering If you have been on opioids for less than two weeks and do not have pain than it is ok to stop all together. Plan to wean off of opioids This plan should start within one week post op of your joint replacement. Maintain the same interval or time between taking each dose and first decrease the dose. Cut the total daily intake of opioids by one tablet each day Next start to increase the time between doses. The last dose that should be eliminated is the evening dose.                      Signed: Thornton [name] ,MD 11/14/2020, 1:39 PM               Electronically signed by Henry Park, MD at 11/14/2020  1:41 PM   Admission (Discharged) on 11/11/2020     Admission (Discharged) on 11/11/2020       Detailed Report     Note shared with patient     Care Timeline  06/28   Admitted (Observation) 1139  07/01   Admitted 1331  07/02   Discharged 1538

## 2020-12-24 ENCOUNTER — Encounter: Payer: Self-pay | Admitting: Internal Medicine

## 2020-12-24 ENCOUNTER — Inpatient Hospital Stay: Payer: Medicare PPO | Attending: Internal Medicine

## 2020-12-24 ENCOUNTER — Other Ambulatory Visit: Payer: Self-pay

## 2020-12-24 ENCOUNTER — Inpatient Hospital Stay: Payer: Medicare PPO | Admitting: Internal Medicine

## 2020-12-24 DIAGNOSIS — C8334 Diffuse large B-cell lymphoma, lymph nodes of axilla and upper limb: Secondary | ICD-10-CM

## 2020-12-24 DIAGNOSIS — M1711 Unilateral primary osteoarthritis, right knee: Secondary | ICD-10-CM | POA: Insufficient documentation

## 2020-12-24 DIAGNOSIS — Z96652 Presence of left artificial knee joint: Secondary | ICD-10-CM | POA: Diagnosis not present

## 2020-12-24 DIAGNOSIS — Z8572 Personal history of non-Hodgkin lymphomas: Secondary | ICD-10-CM | POA: Diagnosis present

## 2020-12-24 DIAGNOSIS — Z7982 Long term (current) use of aspirin: Secondary | ICD-10-CM | POA: Diagnosis not present

## 2020-12-24 DIAGNOSIS — N183 Chronic kidney disease, stage 3 unspecified: Secondary | ICD-10-CM | POA: Diagnosis not present

## 2020-12-24 DIAGNOSIS — R634 Abnormal weight loss: Secondary | ICD-10-CM | POA: Diagnosis not present

## 2020-12-24 LAB — COMPREHENSIVE METABOLIC PANEL
ALT: 9 U/L (ref 0–44)
AST: 15 U/L (ref 15–41)
Albumin: 3.4 g/dL — ABNORMAL LOW (ref 3.5–5.0)
Alkaline Phosphatase: 73 U/L (ref 38–126)
Anion gap: 5 (ref 5–15)
BUN: 13 mg/dL (ref 8–23)
CO2: 26 mmol/L (ref 22–32)
Calcium: 8.9 mg/dL (ref 8.9–10.3)
Chloride: 107 mmol/L (ref 98–111)
Creatinine, Ser: 1.12 mg/dL (ref 0.61–1.24)
GFR, Estimated: >60 mL/min (ref >60)
Glucose, Bld: 96 mg/dL (ref 70–99)
Potassium: 3.7 mmol/L (ref 3.5–5.1)
Sodium: 138 mmol/L (ref 135–145)
Total Bilirubin: 0.7 mg/dL (ref 0.3–1.2)
Total Protein: 6.3 g/dL — ABNORMAL LOW (ref 6.5–8.1)

## 2020-12-24 LAB — CBC WITH DIFFERENTIAL/PLATELET
Abs Immature Granulocytes: 0.01 10*3/uL (ref 0.00–0.07)
Basophils Absolute: 0.1 10*3/uL (ref 0.0–0.1)
Basophils Relative: 1 %
Eosinophils Absolute: 0.3 10*3/uL (ref 0.0–0.5)
Eosinophils Relative: 5 %
HCT: 35.6 % — ABNORMAL LOW (ref 39.0–52.0)
Hemoglobin: 11.9 g/dL — ABNORMAL LOW (ref 13.0–17.0)
Immature Granulocytes: 0 %
Lymphocytes Relative: 13 %
Lymphs Abs: 0.8 10*3/uL (ref 0.7–4.0)
MCH: 30.5 pg (ref 26.0–34.0)
MCHC: 33.4 g/dL (ref 30.0–36.0)
MCV: 91.3 fL (ref 80.0–100.0)
Monocytes Absolute: 0.5 10*3/uL (ref 0.1–1.0)
Monocytes Relative: 8 %
Neutro Abs: 4.5 10*3/uL (ref 1.7–7.7)
Neutrophils Relative %: 73 %
Platelets: 247 10*3/uL (ref 150–400)
RBC: 3.9 MIL/uL — ABNORMAL LOW (ref 4.22–5.81)
RDW: 13.2 % (ref 11.5–15.5)
WBC: 6.2 10*3/uL (ref 4.0–10.5)
nRBC: 0 % (ref 0.0–0.2)

## 2020-12-24 LAB — LACTATE DEHYDROGENASE: LDH: 101 U/L (ref 98–192)

## 2020-12-24 NOTE — Progress Notes (Signed)
[place] OFFICE PROGRESS NOTE  Patient Care Team: Jerrol Banana., MD as PCP - General (Family Medicine) Cammie Sickle, MD as Consulting Physician (Medical Oncology) Noreene Filbert, MD as Referring Physician (Radiation Oncology) Anell Barr, OD as Consulting Physician (Optometry)  Cancer Staging No matching staging information was found for the patient.   Oncology History Overview Note  # AUG 6th 2018- DLBCL STAGE I [LEFT axilla- Core Bx- Dr.Sankar; CD-20, bcl-2;bcl-6 positive]; Ki- 67 >90%. MUM-1 NEG. GCB subtype; Myc- POS; FISH studies-NEGATIVE for gene RE-ARRANGEMENTS [positive for extra-copies of bcl-6/ myc/IgH/bcl-2]PET scan- Right Ax LN [4.5cm; SUV 35]  # R-CHOP [aug 20th 2018] x3 cycles; RT [starting 10/25; Nov 266 2018]; DEc 2018- CR ; except 8 mm left ax LN- no uptake.   # AUG 13th 2018. MUGA scan-56%.   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  male pleasant patient above history of diffuse large B-cell lymphoma is here for follow-up.  In the interim patient underwent left knee surgery.  Patient complains of mild swelling-of the leg however denies any significant pain.  Is awaiting seeing orthopedic doctor today.  Denies any new lumps or bumps.  No fevers or chills.   Review of Systems  Constitutional:  Negative for chills, diaphoresis, fever and weight loss.  HENT:  Negative for nosebleeds and sore throat.   Eyes:  Negative for double vision.  Respiratory:  Negative for cough, hemoptysis, sputum production, shortness of breath and wheezing.   Cardiovascular:  Negative for chest pain, palpitations, orthopnea and leg swelling.  Gastrointestinal:  Negative for abdominal pain, blood in stool, constipation, diarrhea, heartburn, melena, nausea and vomiting.   Genitourinary:  Negative for dysuria, frequency and urgency.  Musculoskeletal:  Positive for joint pain. Negative for back pain.  Skin: Negative.  Negative for itching and rash.  Neurological:  Negative for dizziness, focal weakness, weakness and headaches.  Endo/Heme/Allergies:  Does not bruise/bleed easily.  Psychiatric/Behavioral:  Negative for depression. The patient is not nervous/anxious and does not have insomnia.       PAST MEDICAL HISTORY :  Past Medical History:  Diagnosis Date  . Allergy   . Arthritis   . Cancer ([place])    left axillary  . Cataract   . GERD (gastroesophageal reflux disease)   . Hyperlipidemia   . Hypertension   . Pneumonia     PAST SURGICAL HISTORY :   Past Surgical History:  Procedure Laterality Date  . CATARACT EXTRACTION Bilateral   . COLONOSCOPY    . MOLE REMOVAL    . NECK MASS EXCISION    . PORTACATH PLACEMENT N/A 12/29/2016   Procedure: INSERTION PORT-A-CATH;  Surgeon: Christene Lye, MD;  Location: [facility] ORS;  Service: General;  Laterality: N/A;  . TONSILLECTOMY    . 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   Procedure: LEFT TOTAL KNEE ARTHROPLASTY;  Surgeon: Thornton Park, MD;  Location: [facility] ORS;  Service: Orthopedics;  Laterality: Left;    FAMILY HISTORY :   Family History  Problem Relation Age of Onset  . Heart disease Mother   . Rheumatic fever Mother   . CAD Mother   . Heart attack Father   . COPD Sister     SOCIAL HISTORY:   Social History   Tobacco Use  .  Smoking status: Never  . Smokeless tobacco: Never  Vaping Use  . Vaping Use: Never used  Substance Use Topics  . Alcohol use: Yes    Alcohol/week: 8.0 standard drinks    Types: 8 Cans of beer per week    Comment: 2 beers four days a week  . Drug use: No    ALLERGIES:  is allergic to codeine, lovastatin, pravastatin, pravastatin sodium, simvastatin, statins, and sulfa antibiotics.  MEDICATIONS:  Current Outpatient Medications  Medication Sig Dispense  Refill  . acetaminophen (TYLENOL) 325 MG tablet Take 1-2 tablets (325-650 mg total) by mouth every 6 (six) hours as needed for mild pain (pain score 1-3 or temp > 100.5). 60 tablet 1  . amLODipine (NORVASC) 5 MG tablet TAKE ONE TABLET BY MOUTH DAILY 90 tablet 3  . aspirin EC 81 MG tablet Take 4 tablets (325 mg total) by mouth 2 (two) times daily. 90 tablet 11  . bisacodyl (DULCOLAX) 5 MG EC tablet Take 1 tablet (5 mg total) by mouth daily as needed for moderate constipation. 30 tablet 0  . cetirizine (ZYRTEC) 10 MG tablet Take 10 mg by mouth daily.    . Cholecalciferol (VITAMIN D) 50 MCG (2000 UT) CAPS Take 2,000 Units by mouth daily.    . clotrimazole-betamethasone (LOTRISONE) cream Apply 1 application topically daily as needed (Rash).    Marland Kitchen docusate sodium (COLACE) 50 MG capsule Take 50 mg by mouth daily.    Marland Kitchen lisinopril (ZESTRIL) 40 MG tablet TAKE ONE TABLET BY MOUTH DAILY 90 tablet 3  . omeprazole (PRILOSEC) 20 MG capsule TAKE ONE CAPSULE BY MOUTH DAILY (Patient taking differently: Take 20 mg by mouth daily.) 90 capsule 3  . phenylephrine-shark liver oil-mineral oil-petrolatum (PREPARATION H) 0.25-14-74.9 % rectal ointment Place 1 application rectally 2 (two) times daily as needed for hemorrhoids.    Marland Kitchen ZINC GLUCONATE PO Take by mouth daily. Immune Health     No current facility-administered medications for this visit.    PHYSICAL EXAMINATION: ECOG PERFORMANCE STATUS: 0 - Asymptomatic  BP 128/82 (BP Location: Left Arm, Patient Position: Sitting, Cuff Size: Normal)   Pulse 74   Temp 98.9 F (37.2 C) (Tympanic)   Resp 16   Ht 6' 2"  (1.88 m)   Wt 212 lb (96.2 kg)   SpO2 100%   BMI 27.22 kg/m   Filed Weights   12/24/20 1012  Weight: 212 lb (96.2 kg)    Physical Exam HENT:     Head: Normocephalic and atraumatic.     Mouth/Throat:     Pharynx: No oropharyngeal exudate.  Eyes:     Pupils: Pupils are equal, round, and reactive to light.  Cardiovascular:     Rate and Rhythm:  Normal rate and regular rhythm.  Pulmonary:     Effort: No respiratory distress.     Breath sounds: No wheezing.  Abdominal:     General: Bowel sounds are normal. There is no distension.     Palpations: Abdomen is soft. There is no mass.     Tenderness: There is no abdominal tenderness. There is no guarding or rebound.  Musculoskeletal:        General: No tenderness. Normal range of motion.     Cervical back: Normal range of motion and neck supple.  Skin:    General: Skin is warm.  Neurological:     Mental Status: He is alert and oriented to person, place, and time.  Psychiatric:        Mood and  Affect: Affect normal.       LABORATORY DATA:  I have reviewed the data as listed    Component Value Date/Time   NA 138 12/24/2020 0945   NA 142 08/23/2017 1128   K 3.7 12/24/2020 0945   CL 107 12/24/2020 0945   CO2 26 12/24/2020 0945   GLUCOSE 96 12/24/2020 0945   BUN 13 12/24/2020 0945   BUN 17 08/23/2017 1128   CREATININE 1.12 12/24/2020 0945   CALCIUM 8.9 12/24/2020 0945   PROT 6.3 (L) 12/24/2020 0945   PROT 6.6 08/23/2017 1128   ALBUMIN 3.4 (L) 12/24/2020 0945   ALBUMIN 4.4 08/23/2017 1128   AST 15 12/24/2020 0945   ALT 9 12/24/2020 0945   ALKPHOS 73 12/24/2020 0945   BILITOT 0.7 12/24/2020 0945   BILITOT 0.6 08/23/2017 1128   GFRNONAA >60 12/24/2020 0945   GFRAA >60 12/26/2019 0954    No results found for: SPEP, UPEP  Lab Results  Component Value Date   WBC 6.2 12/24/2020   NEUTROABS 4.5 12/24/2020   HGB 11.9 (L) 12/24/2020   HCT 35.6 (L) 12/24/2020   MCV 91.3 12/24/2020   PLT 247 12/24/2020      Chemistry      Component Value Date/Time   NA 138 12/24/2020 0945   NA 142 08/23/2017 1128   K 3.7 12/24/2020 0945   CL 107 12/24/2020 0945   CO2 26 12/24/2020 0945   BUN 13 12/24/2020 0945   BUN 17 08/23/2017 1128   CREATININE 1.12 12/24/2020 0945   GLU 91 11/06/2013 0000      Component Value Date/Time   CALCIUM 8.9 12/24/2020 0945   ALKPHOS 73  12/24/2020 0945   AST 15 12/24/2020 0945   ALT 9 12/24/2020 0945   BILITOT 0.7 12/24/2020 0945   BILITOT 0.6 08/23/2017 1128       RADIOGRAPHIC STUDIES: I have personally reviewed the radiological images as listed and agreed with the findings in the report. No results found.   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  #  No evidence of progression or recurrence [stable left axillary 1 cm lymph node]- STABLE  # CKD -stage III- STABLE  # Left knee arthritis s/p TKA [Dr.K; [facility]]; no clinical concerns for DVT.  However if the leg swelling gets worse.  Recommend call us regarding ultrasound Dopplers.  # weight loss-sec to surgery; does not appear to be secondary to malignancy.  # DISPOSITION: # follow up in 6 months-MD/cbc/cmp/ldh/Dr.B   Orders Placed This Encounter  Procedures  . CBC with Differential/Platelet    Standing Status:   Future    Standing Expiration Date:   12/24/2021  . Comprehensive metabolic panel    Standing Status:   Future    Standing Expiration Date:   12/24/2021  . Lactate dehydrogenase    Standing Status:   Future    Standing Expiration Date:   12/24/2021   All questions were answered. The patient knows to call the clinic with any problems, questions or concerns.      Cammie Sickle, MD 12/24/2020 2:58 PM

## 2020-12-24 NOTE — Assessment & Plan Note (Addendum)
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  #  No evidence of progression or recurrence [stable left axillary 1 cm lymph node]- STABLE  # CKD -stage III- STABLE  # Left knee arthritis s/p TKA [Dr.K; [facility]]; no clinical concerns for DVT.  However if the leg swelling gets worse.  Recommend call us regarding ultrasound Dopplers.  # weight loss-sec to surgery; does not appear to be secondary to malignancy.  # DISPOSITION: # follow up in 6 months-MD/cbc/cmp/ldh/Dr.B

## 2021-02-11 ENCOUNTER — Other Ambulatory Visit: Payer: Self-pay | Admitting: Family Medicine

## 2021-02-11 NOTE — Telephone Encounter (Signed)
Medication Refill - Medication: clotrimazole-betamethasone (LOTRISONE) cream   Has the patient contacted their pharmacy? Yes.   (Agent: If no, request that the patient contact the pharmacy for the refill.) (Agent: If yes, when and what did the pharmacy advise?)  Preferred Pharmacy (with phone number or street name):  [facility] [id number], [place]  [place] [address]  Phone: [phone] Fax: [phone]   Has the patient been seen for an appointment in the last year OR does the patient have an upcoming appointment? Yes.    Agent: Please be advised that RX refills may take up to 3 business days. We ask that you follow-up with your pharmacy.

## 2021-02-11 NOTE — Telephone Encounter (Signed)
Requested medication (s) are due for refill today - yes  Requested medication (s) are on the active medication list -yes  Future visit scheduled -no  Last refill: 3 month ago  Notes to clinic: Request RF: historical medication, medication not assigned protocol  Requested Prescriptions  Pending Prescriptions Disp Refills   clotrimazole-betamethasone (LOTRISONE) cream 30 g     Sig: Apply 1 application topically daily as needed (Rash).     Off-Protocol Failed - 02/11/2021 11:39 AM      Failed - Medication not assigned to a protocol, review manually.      Passed - Valid encounter within last 12 months    Recent Outpatient Visits           3 months ago Pre-op evaluation   [facility] Jerrol Banana., MD   1 year ago Pain and swelling of left knee   [facility] Carles Collet M, [place]   2 years ago Annual physical exam   [facility] Jerrol Banana., MD   2 years ago Essential (primary) hypertension   [facility] Jerrol Banana., MD   3 years ago Annual physical exam   [facility] Jerrol Banana., MD                 Requested Prescriptions  Pending Prescriptions Disp Refills   clotrimazole-betamethasone (LOTRISONE) cream 30 g     Sig: Apply 1 application topically daily as needed (Rash).     Off-Protocol Failed - 02/11/2021 11:39 AM      Failed - Medication not assigned to a protocol, review manually.      Passed - Valid encounter within last 12 months    Recent Outpatient Visits           3 months ago Pre-op evaluation   [facility] Jerrol Banana., MD   1 year ago Pain and swelling of left knee   [facility] Carles Collet M, [place]   2 years ago Annual physical exam   [facility] Jerrol Banana., MD   2 years ago Essential (primary) hypertension   [facility] Jerrol Banana., MD    3 years ago Annual physical exam   [facility] Jerrol Banana., MD

## 2021-02-16 ENCOUNTER — Other Ambulatory Visit: Payer: Self-pay | Admitting: Family Medicine

## 2021-02-16 DIAGNOSIS — I10 Essential (primary) hypertension: Secondary | ICD-10-CM

## 2021-05-12 ENCOUNTER — Ambulatory Visit (INDEPENDENT_AMBULATORY_CARE_PROVIDER_SITE_OTHER): Payer: Medicare PPO

## 2021-05-12 DIAGNOSIS — Z0000 Encounter for general adult medical examination without abnormal findings: Secondary | ICD-10-CM

## 2021-05-12 NOTE — Patient Instructions (Signed)
Henry [name] , Thank you for taking time to come for your Medicare Wellness Visit. I appreciate your ongoing commitment to your health goals. Please review the following plan we discussed and let me know if I can assist you in the future.   Screening recommendations/referrals: Colonoscopy: 12/17/2016 Recommended yearly ophthalmology/optometry visit for glaucoma screening and checkup Recommended yearly dental visit for hygiene and checkup  Vaccinations: Influenza vaccine: had one this season (unsure of date) Pneumococcal vaccine: 05/16/14 Tdap vaccine: 09/18/18 Shingles vaccine: 12/07/17, 04/01/18   Covid-19: 06/29/19, 07/27/19  Advanced directives: no  Conditions/risks identified: none  Next appointment: Follow up in one year for your annual wellness visit. 05/13/22 @ 1 p.m.  Preventive Care 73 Years and Older, Male Preventive care refers to lifestyle choices and visits with your health care provider that can promote health and wellness. What does preventive care include? A yearly physical exam. This is also called an annual well check. Dental exams once or twice a year. Routine eye exams. Ask your health care provider how often you should have your eyes checked. Personal lifestyle choices, including: Daily care of your teeth and gums. Regular physical activity. Eating a healthy diet. Avoiding tobacco and drug use. Limiting alcohol use. Practicing safe sex. Taking low doses of aspirin every day. Taking vitamin and mineral supplements as recommended by your health care provider. What happens during an annual well check? The services and screenings done by your health care provider during your annual well check will depend on your age, overall health, lifestyle risk factors, and family history of disease. Counseling  Your health care provider may ask you questions about your: Alcohol use. Tobacco use. Drug use. Emotional well-being. Home and relationship well-being. Sexual  activity. Eating habits. History of falls. Memory and ability to understand (cognition). Work and work Statistician. Screening  You may have the following tests or measurements: Height, weight, and BMI. Blood pressure. Lipid and cholesterol levels. These may be checked every 5 years, or more frequently if you are over 17 years old. Skin check. Lung cancer screening. You may have this screening every year starting at age 18 if you have a 30-pack-year history of smoking and currently smoke or have quit within the past 15 years. Fecal occult blood test (FOBT) of the stool. You may have this test every year starting at age 78. Flexible sigmoidoscopy or colonoscopy. You may have a sigmoidoscopy every 5 years or a colonoscopy every 10 years starting at age 28. Prostate cancer screening. Recommendations will vary depending on your family history and other risks. Hepatitis C blood test. Hepatitis B blood test. Sexually transmitted disease (STD) testing. Diabetes screening. This is done by checking your blood sugar (glucose) after you have not eaten for a while (fasting). You may have this done every 1-3 years. Abdominal aortic aneurysm (AAA) screening. You may need this if you are a current or former smoker. Osteoporosis. You may be screened starting at age 21 if you are at high risk. Talk with your health care provider about your test results, treatment options, and if necessary, the need for more tests. Vaccines  Your health care provider may recommend certain vaccines, such as: Influenza vaccine. This is recommended every year. Tetanus, diphtheria, and acellular pertussis (Tdap, Td) vaccine. You may need a Td booster every 10 years. Zoster vaccine. You may need this after age 21. Pneumococcal 13-valent conjugate (PCV13) vaccine. One dose is recommended after age 23. Pneumococcal polysaccharide (PPSV23) vaccine. One dose is recommended after age 25. Talk  to your health care provider about which  screenings and vaccines you need and how often you need them. This information is not intended to replace advice given to you by your health care provider. Make sure you discuss any questions you have with your health care provider. Document Released: 05/30/2015 Document Revised: 01/21/2016 Document Reviewed: 03/04/2015 Elsevier Interactive Patient Education  2017 [employer] Prevention in the Home Falls can cause injuries. They can happen to people of all ages. There are many things you can do to make your home safe and to help prevent falls. What can I do on the outside of my home? Regularly fix the edges of walkways and driveways and fix any cracks. Remove anything that might make you trip as you walk through a door, such as a raised step or threshold. Trim any bushes or trees on the path to your home. Use bright outdoor lighting. Clear any walking paths of anything that might make someone trip, such as rocks or tools. Regularly check to see if handrails are loose or broken. Make sure that both sides of any steps have handrails. Any raised decks and porches should have guardrails on the edges. Have any leaves, snow, or ice cleared regularly. Use sand or salt on walking paths during winter. Clean up any spills in your garage right away. This includes oil or grease spills. What can I do in the bathroom? Use night lights. Install grab bars by the toilet and in the tub and shower. Do not use towel bars as grab bars. Use non-skid mats or decals in the tub or shower. If you need to sit down in the shower, use a plastic, non-slip stool. Keep the floor dry. Clean up any water that spills on the floor as soon as it happens. Remove soap buildup in the tub or shower regularly. Attach bath mats securely with double-sided non-slip rug tape. Do not have throw rugs and other things on the floor that can make you trip. What can I do in the bedroom? Use night lights. Make sure that you have a  light by your bed that is easy to reach. Do not use any sheets or blankets that are too big for your bed. They should not hang down onto the floor. Have a firm chair that has side arms. You can use this for support while you get dressed. Do not have throw rugs and other things on the floor that can make you trip. What can I do in the kitchen? Clean up any spills right away. Avoid walking on wet floors. Keep items that you use a lot in easy-to-reach places. If you need to reach something above you, use a strong step stool that has a grab bar. Keep electrical cords out of the way. Do not use floor polish or wax that makes floors slippery. If you must use wax, use non-skid floor wax. Do not have throw rugs and other things on the floor that can make you trip. What can I do with my stairs? Do not leave any items on the stairs. Make sure that there are handrails on both sides of the stairs and use them. Fix handrails that are broken or loose. Make sure that handrails are as long as the stairways. Check any carpeting to make sure that it is firmly attached to the stairs. Fix any carpet that is loose or worn. Avoid having throw rugs at the top or bottom of the stairs. If you do have throw rugs,  attach them to the floor with carpet tape. Make sure that you have a light switch at the top of the stairs and the bottom of the stairs. If you do not have them, ask someone to add them for you. What else can I do to help prevent falls? Wear shoes that: Do not have high heels. Have rubber bottoms. Are comfortable and fit you well. Are closed at the toe. Do not wear sandals. If you use a stepladder: Make sure that it is fully opened. Do not climb a closed stepladder. Make sure that both sides of the stepladder are locked into place. Ask someone to hold it for you, if possible. Clearly mark and make sure that you can see: Any grab bars or handrails. First and last steps. Where the edge of each step  is. Use tools that help you move around (mobility aids) if they are needed. These include: Canes. Walkers. Scooters. Crutches. Turn on the lights when you go into a dark area. Replace any light bulbs as soon as they burn out. Set up your furniture so you have a clear path. Avoid moving your furniture around. If any of your floors are uneven, fix them. If there are any pets around you, be aware of where they are. Review your medicines with your doctor. Some medicines can make you feel dizzy. This can increase your chance of falling. Ask your doctor what other things that you can do to help prevent falls. This information is not intended to replace advice given to you by your health care provider. Make sure you discuss any questions you have with your health care provider. Document Released: 02/27/2009 Document Revised: 10/09/2015 Document Reviewed: 06/07/2014 Elsevier Interactive Patient Education  2017 [employer].

## 2021-05-12 NOTE — Progress Notes (Signed)
Virtual Visit via Telephone Note  I connected with  Henry [name] on 05/12/21 at  1:00 PM EST by telephone and verified that I am speaking with the correct person using two identifiers.  Location: Patient: [place]  Provider: [facility] Persons participating in the virtual visit: patient/Nurse Health Advisor   I discussed the limitations, risks, security and privacy concerns of performing an evaluation and management service by telephone and the availability of in person appointments. The patient expressed understanding and agreed to proceed.  Interactive audio and video telecommunications were attempted between this nurse and patient, however failed, due to patient having technical difficulties OR patient did not have access to video capability.  We continued and completed visit with audio only.  Some vital signs may be absent or patient reported.   Dionisio David, LPN  Subjective:   Henry [name] is a 75 y.o. male who presents for Medicare Annual/Subsequent preventive examination.  Review of Systems           Objective:    There were no vitals filed for this visit. There is no height or weight on file to calculate BMI.  Advanced Directives 05/12/2021 11/11/2020 11/11/2020 11/03/2020 06/25/2020 08/29/2019 05/30/2019  Does Patient Have a Medical Advance Directive? No Yes No No Yes Yes Yes  Type of Advance Directive - Living will;Healthcare Power of [place];Living will [place];Living will [place];Living will  Does patient want to make changes to medical advance directive? - No - Patient declined - - No - Patient declined - -  Copy of [place] in Chart? - No - copy requested - - No - copy requested No - copy requested No - copy requested  Would patient like information on creating a medical advance directive? No - Patient declined No - Patient declined Yes (MAU/Ambulatory/Procedural Areas - Information  given) - No - Patient declined - -    Current Medications (verified) Outpatient Encounter Medications as of 05/12/2021  Medication Sig   acetaminophen (TYLENOL) 325 MG tablet Take 1-2 tablets (325-650 mg total) by mouth every 6 (six) hours as needed for mild pain (pain score 1-3 or temp > 100.5).   amLODipine (NORVASC) 5 MG tablet TAKE ONE TABLET BY MOUTH DAILY   aspirin EC 81 MG tablet Take 4 tablets (325 mg total) by mouth 2 (two) times daily.   bisacodyl (DULCOLAX) 5 MG EC tablet Take 1 tablet (5 mg total) by mouth daily as needed for moderate constipation.   cetirizine (ZYRTEC) 10 MG tablet Take 10 mg by mouth daily.   Cholecalciferol (VITAMIN D) 50 MCG (2000 UT) CAPS Take 2,000 Units by mouth daily.   clotrimazole-betamethasone (LOTRISONE) cream Apply 1 application topically daily as needed (Rash).   docusate sodium (COLACE) 50 MG capsule Take 50 mg by mouth daily.   lisinopril (ZESTRIL) 40 MG tablet TAKE ONE TABLET BY MOUTH DAILY   omeprazole (PRILOSEC) 20 MG capsule TAKE ONE CAPSULE BY MOUTH DAILY   phenylephrine-shark liver oil-mineral oil-petrolatum (PREPARATION H) 0.25-14-74.9 % rectal ointment Place 1 application rectally 2 (two) times daily as needed for hemorrhoids.   ZINC GLUCONATE PO Take by mouth daily. Immune Health (Patient not taking: Reported on 05/12/2021)   No facility-administered encounter medications on file as of 05/12/2021.    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   Cataract    GERD (gastroesophageal reflux disease)    Hyperlipidemia    Hypertension    Pneumonia    Past Surgical History:  Procedure Laterality Date   CATARACT EXTRACTION Bilateral    COLONOSCOPY     MOLE REMOVAL     NECK MASS EXCISION     PORTACATH PLACEMENT N/A 12/29/2016   Procedure: INSERTION  PORT-A-CATH;  Surgeon: Christene Lye, MD;  Location: [facility] ORS;  Service: General;  Laterality: N/A;   TONSILLECTOMY     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   Procedure: LEFT TOTAL KNEE ARTHROPLASTY;  Surgeon: Thornton Park, MD;  Location: [facility] ORS;  Service: Orthopedics;  Laterality: Left;   Family History  Problem Relation Age of Onset   Heart disease Mother    Rheumatic fever Mother    CAD Mother    Heart attack Father    COPD Sister    Social History   Socioeconomic History   Marital status: Married    Spouse name: Not on file   Number of children: 2   Years of education: Not on file   Highest education level: Some college, no degree  Occupational History   Occupation: retired  Tobacco Use   Smoking status: Never   Smokeless tobacco: Never  Vaping Use   Vaping Use: Never used  Substance and Sexual Activity   Alcohol use: Yes    Alcohol/week: 8.0 standard drinks    Types: 8 Cans of beer per week    Comment: 2 beers four days a week   Drug use: No   Sexual activity: Not on file  Other Topics Concern   Not on file  Social History Narrative   Not on file   Social Determinants of Health   Financial Resource Strain: Low Risk    Difficulty of Paying Living Expenses: Not hard at all  Food Insecurity: No Food Insecurity   Worried About Charity fundraiser in the Last Year: Never true   Ran Out of Food in the Last Year: Never true  Transportation Needs: No Transportation Needs   Lack of Transportation (Medical): No   Lack of Transportation (Non-Medical): No  Physical Activity: Sufficiently Active   Days of Exercise per Week: 5 days   Minutes of Exercise per Session: 40 min  Stress: No Stress Concern Present   Feeling of Stress : Not at all  Social Connections: Socially Integrated   Frequency of Communication with Friends and Family: More than three times a week   Frequency of Social Gatherings with Friends and Family: More than three times a week    Attends Religious Services: More than 4 times per year   Active Member of [employer] or Organizations: Yes   Attends Archivist Meetings: 1 to 4 times per year   Marital Status: Married    Tobacco Counseling Counseling given: Not Answered   Clinical Intake:  Pre-visit preparation completed: Yes  Pain : No/denies pain     Nutritional Risks: None Diabetes: No  How often do you need to have someone help you when you read instructions, pamphlets, or other written materials from your doctor or pharmacy?: 1 - Never  Diabetic?no  Interpreter Needed?: No  Information entered by :: Kirke Shaggy, LPN   Activities of Daily Living In your present state of health, do you have any difficulty performing the following activities: 11/11/2020 11/11/2020  Hearing? - Y  Vision? - N  Difficulty  concentrating or making decisions? - N  Walking or climbing stairs? - Y  Dressing or bathing? - N  Doing errands, shopping? N -  Some recent data might be hidden    Patient Care Team: Jerrol Banana., MD as PCP - General (Family Medicine) Cammie Sickle, MD as Consulting Physician (Medical Oncology) Noreene Filbert, MD as Referring Physician (Radiation Oncology) Anell Barr, OD as Consulting Physician (Optometry)  Indicate any recent Medical Services you may have received from other than [facility] providers in the past year (date may be approximate).     Assessment:   This is a routine wellness examination for [place].  Hearing/Vision screen No results found.  Dietary issues and exercise activities discussed:     Goals Addressed             This Visit's Progress    DIET - EAT MORE FRUITS AND VEGETABLES         Depression Screen PHQ 2/9 Scores 05/12/2021 11/03/2020 08/29/2019 08/28/2018 11/04/2017 08/22/2017 05/13/2017  PHQ - 2 Score 0 0 0 0 0 0 0  PHQ- 9 Score - - - - - - -    Fall Risk Fall Risk  05/12/2021 11/03/2020 08/29/2019 08/28/2018 06/27/2018  Falls in  the past year? - 0 0 0 0  Number falls in past yr: 0 0 0 - -  Injury with Fall? 0 0 0 - -  Risk for fall due to : No Fall Risks No Fall Risks - - -  Follow up Falls evaluation completed Falls evaluation completed Falls prevention discussed - Falls evaluation completed    FALL RISK PREVENTION PERTAINING TO THE HOME:  Any stairs in or around the home? Yes  If so, are there any without handrails? Yes  Home free of loose throw rugs in walkways, pet beds, electrical cords, etc? Yes  Adequate lighting in your home to reduce risk of falls? Yes   ASSISTIVE DEVICES UTILIZED TO PREVENT FALLS:  Life alert? No  Use of a cane, walker or w/c? No  Grab bars in the bathroom? Yes  Shower chair or bench in shower? Yes  Elevated toilet seat or a handicapped toilet? Yes   Cognitive Function:Normal cognitive status assessed by direct observation by this Nurse Health Advisor. No abnormalities found.       6CIT Screen 08/03/2016  What Year? 0 points  What month? 0 points  What time? 0 points  Count back from 20 0 points  Months in reverse 0 points  Repeat phrase 6 points  Total Score 6    Immunizations Immunization History  Administered Date(s) Administered   Fluad Quad(high Dose 65+) 02/14/2019, 03/20/2020   Influenza Split 04/01/2007, 02/20/2010, 03/25/2011, 03/30/2012   Influenza, High Dose Seasonal PF 02/19/2014, 03/05/2015, 03/18/2016, 02/21/2018   Influenza,inj,Quad PF,6+ Mos 02/21/2013, 03/09/2017   Moderna Sars-Covid-2 Vaccination 06/29/2019, 07/27/2019   Pneumococcal Conjugate-13 04/24/2013   Pneumococcal Polysaccharide-23 05/16/2014   Td 09/18/2018   Tdap 01/19/2005   Zoster Recombinat (Shingrix) 12/07/2017, 04/01/2018   Zoster, Live 03/30/2012    TDAP status: Up to date  Flu Vaccine status: Up to date  Pneumococcal vaccine status: Up to date  Covid-19 vaccine status: Completed vaccines  Qualifies for Shingles Vaccine? Yes   Zostavax completed Yes   Shingrix Completed?:  Yes  Screening Tests Health Maintenance  Topic Date Due   COVID-19 Vaccine (3 - Moderna risk series) 08/24/2019   INFLUENZA VACCINE  12/15/2020   COLONOSCOPY (Pts 45-92yrs Insurance coverage will need  to be confirmed)  12/16/2020   TETANUS/TDAP  09/17/2028   Pneumonia Vaccine 96+ Years old  Completed   Hepatitis C Screening  Completed   Zoster Vaccines- Shingrix  Completed   HPV VACCINES  Aged Out    Health Maintenance  Health Maintenance Due  Topic Date Due   COVID-19 Vaccine (3 - Moderna risk series) 08/24/2019   INFLUENZA VACCINE  12/15/2020   COLONOSCOPY (Pts 45-15yrs Insurance coverage will need to be confirmed)  12/16/2020    Colorectal cancer screening: Type of screening: Colonoscopy. Completed 01/14/2017. Repeat every 10 years  Lung Cancer Screening: (Low Dose CT Chest recommended if Age 44-80 years, 30 pack-year currently smoking OR have quit w/in 15years.) does not qualify.    Additional Screening:  Hepatitis C Screening: does qualify; Completed 04/03/12  Vision Screening: Recommended annual ophthalmology exams for early detection of glaucoma and other disorders of the eye. Is the patient up to date with their annual eye exam?  Yes  Who is the provider or what is the name of the office in which the patient attends annual eye exams? DR is in [place] If pt is not established with a provider, would they like to be referred to a provider to establish care? No .   Dental Screening: Recommended annual dental exams for proper oral hygiene  Community Resource Referral / Chronic Care Management: CRR required this visit?  No   CCM required this visit?  No      Plan:     I have personally reviewed and noted the following in the patients chart:   Medical and social history Use of alcohol, tobacco or illicit drugs  Current medications and supplements including opioid prescriptions. Patient is not currently taking opioid prescriptions. Functional ability and  status Nutritional status Physical activity Advanced directives List of other physicians Hospitalizations, surgeries, and ER visits in previous 12 months Vitals Screenings to include cognitive, depression, and falls Referrals and appointments  In addition, I have reviewed and discussed with patient certain preventive protocols, quality metrics, and best practice recommendations. A written personalized care plan for preventive services as well as general preventive health recommendations were provided to patient.     Dionisio David, LPN   [id number]   Nurse Notes: none

## 2021-05-18 ENCOUNTER — Other Ambulatory Visit: Payer: Self-pay | Admitting: Family Medicine

## 2021-05-18 DIAGNOSIS — I10 Essential (primary) hypertension: Secondary | ICD-10-CM

## 2021-06-19 ENCOUNTER — Encounter: Payer: Self-pay | Admitting: Internal Medicine

## 2021-06-22 ENCOUNTER — Encounter: Payer: Self-pay | Admitting: Internal Medicine

## 2021-06-23 ENCOUNTER — Ambulatory Visit (INDEPENDENT_AMBULATORY_CARE_PROVIDER_SITE_OTHER): Payer: Medicare HMO | Admitting: Physician Assistant

## 2021-06-23 ENCOUNTER — Encounter: Payer: Self-pay | Admitting: Internal Medicine

## 2021-06-23 ENCOUNTER — Other Ambulatory Visit: Payer: Self-pay

## 2021-06-23 ENCOUNTER — Encounter: Payer: Self-pay | Admitting: Physician Assistant

## 2021-06-23 VITALS — BP 145/92 | HR 73 | Temp 98.0°F | Resp 16 | Wt 223.7 lb

## 2021-06-23 DIAGNOSIS — L989 Disorder of the skin and subcutaneous tissue, unspecified: Secondary | ICD-10-CM

## 2021-06-23 NOTE — Assessment & Plan Note (Addendum)
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  Discussed importance of sunscreen and skin coverings to reduce further skin damage Will await results from Dermatology to dictate management

## 2021-06-23 NOTE — Progress Notes (Signed)
Established patient visit   Patient: Henry [name]   DOB: [birth year]   76 y.o. Male  MRN: [id number] Visit Date: 06/23/2021  Today's healthcare provider: Dani Gobble Lariya Kinzie, PA-C  Introduced myself to the patient as a Journalist, newspaper and provided education on APPs in clinical practice.    Chief Complaint  Patient presents with   Rash   I,Sulibeya S Dimas,acting as a scribe for [employer], PA-C.,have documented all relevant documentation on the behalf of Yacoub Diltz E Ngai Parcell, PA-C,as directed by  Junie Panning E Jamesen Stahnke, PA-C while in the presence of Terrilee Dudzik E Exilda Wilhite, PA-C.  Subjective    Rash This is a new problem. The current episode started more than 1 month ago. The problem is unchanged. The affected locations include the back. The rash is characterized by itchiness. He was exposed to nothing. Pertinent negatives include no fatigue, fever or shortness of breath. Past treatments include nothing. There is no history of allergies, asthma or eczema.    The lesion presented about one year ago and has been itching He denies it bleeding or causing scab He is unsure if it is getting bigger  He denies using at home treatments      Medications: Outpatient Medications Prior to Visit  Medication Sig   acetaminophen (TYLENOL) 325 MG tablet Take 1-2 tablets (325-650 mg total) by mouth every 6 (six) hours as needed for mild pain (pain score 1-3 or temp > 100.5).   amLODipine (NORVASC) 5 MG tablet TAKE ONE TABLET BY MOUTH DAILY   aspirin EC 81 MG tablet Take 4 tablets (325 mg total) by mouth 2 (two) times daily.   bisacodyl (DULCOLAX) 5 MG EC tablet Take 1 tablet (5 mg total) by mouth daily as needed for moderate constipation.   cetirizine (ZYRTEC) 10 MG tablet Take 10 mg by mouth daily.   Cholecalciferol (VITAMIN D) 50 MCG (2000 UT) CAPS Take 2,000 Units by mouth daily.   clotrimazole-betamethasone (LOTRISONE) cream Apply 1 application topically daily as needed (Rash).   docusate sodium (COLACE) 50 MG capsule Take 50  mg by mouth daily.   latanoprost (XALATAN) 0.005 % ophthalmic solution SMARTSIG:In Eye(s)   lisinopril (ZESTRIL) 40 MG tablet TAKE ONE TABLET BY MOUTH DAILY   omeprazole (PRILOSEC) 20 MG capsule TAKE ONE CAPSULE BY MOUTH DAILY   phenylephrine-shark liver oil-mineral oil-petrolatum (PREPARATION H) 0.25-14-74.9 % rectal ointment Place 1 application rectally 2 (two) times daily as needed for hemorrhoids.   timolol (TIMOPTIC) 0.5 % ophthalmic solution SMARTSIG:In Eye(s)   ZINC GLUCONATE PO Take by mouth daily. Immune Health   No facility-administered medications prior to visit.    Review of Systems  Constitutional:  Negative for appetite change, fatigue and fever.  Respiratory:  Negative for chest tightness and shortness of breath.   Skin:  Positive for rash.      Objective    BP (!) 145/92 (BP Location: Left Arm, Patient Position: Sitting, Cuff Size: Large)    Pulse 73    Temp 98 F (36.7 C) (Temporal)    Resp 16    Wt 223 lb 11.2 oz (101.5 kg)    BMI 28.72 kg/m  {Show previous vital signs (optional):23777}  Physical Exam Vitals reviewed.  Constitutional:      General: He is awake.     Appearance: Normal appearance. He is well-developed, well-groomed and overweight.  Skin:    Findings: Lesion present.       Neurological:     Mental Status: He is  alert.  Psychiatric:        Behavior: Behavior is cooperative.      No results found for any visits on 06/23/21.  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  Discussed importance of sunscreen and skin coverings to reduce further skin damage Will await results from Dermatology to dictate management        Relevant Orders   Ambulatory referral to Dermatology      No follow-ups on file.   I, Thena Devora E Azrielle Springsteen, PA-C, have reviewed all documentation for this visit. The documentation on 06/23/21 for the exam, diagnosis, procedures, and orders are all accurate and complete.   Jovanna Hodges, [place]ie [place] MPH [place], PA-C  [facility] [phone] (phone) [phone] (fax)  [place]

## 2021-06-25 DIAGNOSIS — L570 Actinic keratosis: Secondary | ICD-10-CM | POA: Diagnosis not present

## 2021-06-25 DIAGNOSIS — I781 Nevus, non-neoplastic: Secondary | ICD-10-CM | POA: Diagnosis not present

## 2021-06-26 ENCOUNTER — Other Ambulatory Visit: Payer: Self-pay

## 2021-06-26 ENCOUNTER — Inpatient Hospital Stay: Payer: Medicare HMO | Attending: Internal Medicine

## 2021-06-26 ENCOUNTER — Encounter: Payer: Self-pay | Admitting: Internal Medicine

## 2021-06-26 ENCOUNTER — Inpatient Hospital Stay (HOSPITAL_BASED_OUTPATIENT_CLINIC_OR_DEPARTMENT_OTHER): Payer: Medicare HMO | Admitting: Internal Medicine

## 2021-06-26 DIAGNOSIS — Z8572 Personal history of non-Hodgkin lymphomas: Secondary | ICD-10-CM | POA: Diagnosis not present

## 2021-06-26 DIAGNOSIS — N189 Chronic kidney disease, unspecified: Secondary | ICD-10-CM | POA: Insufficient documentation

## 2021-06-26 DIAGNOSIS — C8334 Diffuse large B-cell lymphoma, lymph nodes of axilla and upper limb: Secondary | ICD-10-CM | POA: Diagnosis not present

## 2021-06-26 LAB — CBC WITH DIFFERENTIAL/PLATELET
Abs Immature Granulocytes: 0.02 10*3/uL (ref 0.00–0.07)
Basophils Absolute: 0.1 10*3/uL (ref 0.0–0.1)
Basophils Relative: 1 %
Eosinophils Absolute: 0.2 10*3/uL (ref 0.0–0.5)
Eosinophils Relative: 4 %
HCT: 43 % (ref 39.0–52.0)
Hemoglobin: 14.4 g/dL (ref 13.0–17.0)
Immature Granulocytes: 0 %
Lymphocytes Relative: 17 %
Lymphs Abs: 0.9 10*3/uL (ref 0.7–4.0)
MCH: 30.8 pg (ref 26.0–34.0)
MCHC: 33.5 g/dL (ref 30.0–36.0)
MCV: 92.1 fL (ref 80.0–100.0)
Monocytes Absolute: 0.5 10*3/uL (ref 0.1–1.0)
Monocytes Relative: 9 %
Neutro Abs: 3.4 10*3/uL (ref 1.7–7.7)
Neutrophils Relative %: 69 %
Platelets: 200 10*3/uL (ref 150–400)
RBC: 4.67 MIL/uL (ref 4.22–5.81)
RDW: 13.4 % (ref 11.5–15.5)
WBC: 5 10*3/uL (ref 4.0–10.5)
nRBC: 0 % (ref 0.0–0.2)

## 2021-06-26 LAB — COMPREHENSIVE METABOLIC PANEL
ALT: 13 U/L (ref 0–44)
AST: 15 U/L (ref 15–41)
Albumin: 3.9 g/dL (ref 3.5–5.0)
Alkaline Phosphatase: 67 U/L (ref 38–126)
Anion gap: 8 (ref 5–15)
BUN: 15 mg/dL (ref 8–23)
CO2: 26 mmol/L (ref 22–32)
Calcium: 9.4 mg/dL (ref 8.9–10.3)
Chloride: 105 mmol/L (ref 98–111)
Creatinine, Ser: 1.25 mg/dL — ABNORMAL HIGH (ref 0.61–1.24)
GFR, Estimated: >60 mL/min (ref >60)
Glucose, Bld: 97 mg/dL (ref 70–99)
Potassium: 4.1 mmol/L (ref 3.5–5.1)
Sodium: 139 mmol/L (ref 135–145)
Total Bilirubin: 0.4 mg/dL (ref 0.3–1.2)
Total Protein: 6.9 g/dL (ref 6.5–8.1)

## 2021-06-26 LAB — LACTATE DEHYDROGENASE: LDH: 110 U/L (ref 98–192)

## 2021-06-26 NOTE — Progress Notes (Signed)
Patient recently found a suspicious area on back and was evaluated by dermatology yesterday.

## 2021-06-26 NOTE — Assessment & Plan Note (Addendum)
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  #  No evidence of progression or recurrence [stable left axillary 1 cm lymph node]- STABLE  # CKD -stage III- STABLE  # DISPOSITION: # follow up in 6 months-MD/cbc/cmp/ldh/Dr.B

## 2021-06-26 NOTE — Progress Notes (Signed)
[facility] OFFICE PROGRESS NOTE  Patient Care Team: Jerrol Banana., MD as PCP - General (Family Medicine) Cammie Sickle, MD as Consulting Physician (Medical Oncology) Noreene Filbert, MD as Referring Physician (Radiation Oncology) Anell Barr, OD as Consulting Physician (Optometry)   Cancer Staging  No matching staging information was found for the patient.   Oncology History Overview Note  # AUG 6th 2018- DLBCL STAGE I [LEFT axilla- Core Bx- Dr.Sankar; CD-20, bcl-2;bcl-6 positive]; Ki- 67 >90%. MUM-1 NEG. GCB subtype; Myc- POS; FISH studies-NEGATIVE for gene RE-ARRANGEMENTS [positive for extra-copies of bcl-6/ myc/IgH/bcl-2]PET scan- Right Ax LN [4.5cm; SUV 35]  # R-CHOP [aug 20th 2018] x3 cycles; RT [starting 10/25; Nov 266 2018]; DEc 2018- CR ; except 8 mm left ax LN- no uptake.   # AUG 13th 2018. MUGA scan-56%.   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  Ambulating independently.   Henry [name] 76 y.o.  male pleasant patient above history of diffuse large B-cell lymphoma is here for follow-up.  Patient overall is doing well.  Recovered well from his left knee surgery.  Denies any new lumps or bumps.  No fevers or chills.  Review of Systems  Constitutional:  Negative for chills, diaphoresis, fever and weight loss.  HENT:  Negative for nosebleeds and sore throat.   Eyes:  Negative for double vision.  Respiratory:  Negative for cough, hemoptysis, sputum production, shortness of breath and wheezing.   Cardiovascular:  Negative for chest pain, palpitations, orthopnea and leg swelling.  Gastrointestinal:  Negative for abdominal pain, blood in stool, constipation, diarrhea, heartburn, melena, nausea and vomiting.  Genitourinary:  Negative for dysuria, frequency and urgency.  Musculoskeletal:   Positive for joint pain. Negative for back pain.  Skin: Negative.  Negative for itching and rash.  Neurological:  Negative for dizziness, focal weakness, weakness and headaches.  Endo/Heme/Allergies:  Does not bruise/bleed easily.  Psychiatric/Behavioral:  Negative for depression. The patient is not nervous/anxious and does not have insomnia.      PAST MEDICAL HISTORY :  Past Medical History:  Diagnosis Date   Allergy    Arthritis    Cancer ([place])    left axillary   Cataract    GERD (gastroesophageal reflux disease)    Hyperlipidemia    Hypertension    Pneumonia     PAST SURGICAL HISTORY :   Past Surgical History:  Procedure Laterality Date   CATARACT EXTRACTION Bilateral    COLONOSCOPY     MOLE REMOVAL     NECK MASS EXCISION     PORTACATH PLACEMENT N/A 12/29/2016   Procedure: INSERTION PORT-A-CATH;  Surgeon: Christene Lye, MD;  Location: [facility] ORS;  Service: General;  Laterality: N/A;   TONSILLECTOMY     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   Procedure: LEFT TOTAL KNEE ARTHROPLASTY;  Surgeon: Thornton Park, MD;  Location: [facility] ORS;  Service: Orthopedics;  Laterality: Left;    FAMILY HISTORY :   Family History  Problem Relation Age of Onset   Heart disease Mother    Rheumatic fever Mother    CAD Mother    Heart attack Father    COPD Sister     SOCIAL HISTORY:   Social History   Tobacco Use   Smoking status: Never   Smokeless tobacco: Never  Vaping Use   Vaping Use: Never used  Substance Use Topics   Alcohol use: Yes    Alcohol/week: 8.0 standard drinks    Types: 8 Cans of beer per week    Comment: 2 beers four days a week   Drug use: No    ALLERGIES:  is allergic to codeine, lovastatin, pravastatin, pravastatin sodium, simvastatin, statins, and sulfa antibiotics.  MEDICATIONS:  Current Outpatient Medications  Medication Sig Dispense Refill   acetaminophen (TYLENOL) 325 MG tablet Take 1-2 tablets (325-650 mg total) by mouth every 6 (six)  hours as needed for mild pain (pain score 1-3 or temp > 100.5). 60 tablet 1   amLODipine (NORVASC) 5 MG tablet TAKE ONE TABLET BY MOUTH DAILY 90 tablet 0   aspirin EC 81 MG tablet Take 4 tablets (325 mg total) by mouth 2 (two) times daily. 90 tablet 11   bisacodyl (DULCOLAX) 5 MG EC tablet Take 1 tablet (5 mg total) by mouth daily as needed for moderate constipation. 30 tablet 0   cetirizine (ZYRTEC) 10 MG tablet Take 10 mg by mouth daily.     Cholecalciferol (VITAMIN D) 50 MCG (2000 UT) CAPS Take 2,000 Units by mouth daily.     clotrimazole-betamethasone (LOTRISONE) cream Apply 1 application topically daily as needed (Rash).     docusate sodium (COLACE) 50 MG capsule Take 50 mg by mouth daily.     latanoprost (XALATAN) 0.005 % ophthalmic solution SMARTSIG:In Eye(s)     lisinopril (ZESTRIL) 40 MG tablet TAKE ONE TABLET BY MOUTH DAILY 90 tablet 0   omeprazole (PRILOSEC) 20 MG capsule TAKE ONE CAPSULE BY MOUTH DAILY 90 capsule 0   phenylephrine-shark liver oil-mineral oil-petrolatum (PREPARATION H) 0.25-14-74.9 % rectal ointment Place 1 application rectally 2 (two) times daily as needed for hemorrhoids.     timolol (TIMOPTIC) 0.5 % ophthalmic solution SMARTSIG:In Eye(s)     TURMERIC PO Take by mouth.     ZINC GLUCONATE PO Take by mouth daily. Immune Health (Patient not taking: Reported on 06/26/2021)     No current facility-administered medications for this visit.    PHYSICAL EXAMINATION: ECOG PERFORMANCE STATUS: 0 - Asymptomatic  BP 133/90    Pulse 69    Temp (!) 96 F (35.6 C)    Resp 18    Wt 222 lb 9.6 oz (101 kg)    BMI 28.58 kg/m   Filed Weights   06/26/21 1000  Weight: 222 lb 9.6 oz (101 kg)   Approximately 1 cm lymph node found in the left axilla-stable not any worse  Physical Exam HENT:     Head: Normocephalic and atraumatic.     Mouth/Throat:     Pharynx: No oropharyngeal exudate.  Eyes:     Pupils: Pupils are equal, round, and reactive to light.  Cardiovascular:      Rate and Rhythm: Normal rate and regular rhythm.  Pulmonary:     Effort: No respiratory distress.     Breath sounds: No wheezing.  Abdominal:     General: Bowel sounds are normal. There is no distension.     Palpations: Abdomen is soft. There is no mass.     Tenderness: There is no abdominal tenderness. There is no guarding or rebound.  Musculoskeletal:        General: No tenderness. Normal range of motion.     Cervical back: Normal range of motion and neck supple.  Skin:    General: Skin is warm.  Neurological:     Mental  Status: He is alert and oriented to person, place, and time.  Psychiatric:        Mood and Affect: Affect normal.       LABORATORY DATA:  I have reviewed the data as listed    Component Value Date/Time   NA 139 06/26/2021 0937   NA 142 08/23/2017 1128   K 4.1 06/26/2021 0937   CL 105 06/26/2021 0937   CO2 26 06/26/2021 0937   GLUCOSE 97 06/26/2021 0937   BUN 15 06/26/2021 0937   BUN 17 08/23/2017 1128   CREATININE 1.25 (H) 06/26/2021 0937   CALCIUM 9.4 06/26/2021 0937   PROT 6.9 06/26/2021 0937   PROT 6.6 08/23/2017 1128   ALBUMIN 3.9 06/26/2021 0937   ALBUMIN 4.4 08/23/2017 1128   AST 15 06/26/2021 0937   ALT 13 06/26/2021 0937   ALKPHOS 67 06/26/2021 0937   BILITOT 0.4 06/26/2021 0937   BILITOT 0.6 08/23/2017 1128   GFRNONAA >60 06/26/2021 0937   GFRAA >60 12/26/2019 0954    No results found for: SPEP, UPEP  Lab Results  Component Value Date   WBC 5.0 06/26/2021   NEUTROABS 3.4 06/26/2021   HGB 14.4 06/26/2021   HCT 43.0 06/26/2021   MCV 92.1 06/26/2021   PLT 200 06/26/2021      Chemistry      Component Value Date/Time   NA 139 06/26/2021 0937   NA 142 08/23/2017 1128   K 4.1 06/26/2021 0937   CL 105 06/26/2021 0937   CO2 26 06/26/2021 0937   BUN 15 06/26/2021 0937   BUN 17 08/23/2017 1128   CREATININE 1.25 (H) 06/26/2021 0937   GLU 91 11/06/2013 0000      Component Value Date/Time   CALCIUM 9.4 06/26/2021 0937   ALKPHOS  67 06/26/2021 0937   AST 15 06/26/2021 0937   ALT 13 06/26/2021 0937   BILITOT 0.4 06/26/2021 0937   BILITOT 0.6 08/23/2017 1128       RADIOGRAPHIC STUDIES: I have personally reviewed the radiological images as listed and agreed with the findings in the report. No results found.   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  #  No evidence of progression or recurrence [stable left axillary 1 cm lymph node]- STABLE  # CKD -stage III- STABLE  # DISPOSITION: # follow up in 6 months-MD/cbc/cmp/ldh/Dr.B    Orders Placed This Encounter  Procedures   CBC with Differential/Platelet    Standing Status:   Future    Standing Expiration Date:   06/26/2022   Comprehensive metabolic panel    Standing Status:   Future    Standing Expiration Date:   06/26/2022   Lactate dehydrogenase    Standing Status:   Future    Standing Expiration Date:   06/26/2022   All questions were answered. The patient knows to call the clinic with any problems, questions or concerns.      Cammie Sickle, MD 06/26/2021 1:13 PM

## 2021-08-17 DIAGNOSIS — H26493 Other secondary cataract, bilateral: Secondary | ICD-10-CM | POA: Diagnosis not present

## 2021-08-17 DIAGNOSIS — H401131 Primary open-angle glaucoma, bilateral, mild stage: Secondary | ICD-10-CM | POA: Diagnosis not present

## 2021-08-20 ENCOUNTER — Telehealth: Payer: Self-pay | Admitting: Family Medicine

## 2021-08-20 DIAGNOSIS — K219 Gastro-esophageal reflux disease without esophagitis: Secondary | ICD-10-CM

## 2021-08-20 DIAGNOSIS — I10 Essential (primary) hypertension: Secondary | ICD-10-CM

## 2021-08-20 MED ORDER — LISINOPRIL 40 MG PO TABS: 40.0000 mg | ORAL_TABLET | Freq: Every day | ORAL | 0 refills | Status: DC

## 2021-08-20 MED ORDER — AMLODIPINE BESYLATE 5 MG PO TABS: 5.0000 mg | ORAL_TABLET | Freq: Every day | ORAL | 0 refills | Status: DC

## 2021-08-20 MED ORDER — OMEPRAZOLE 20 MG PO CPDR: 20.0000 mg | DELAYED_RELEASE_CAPSULE | Freq: Every day | ORAL | 0 refills | Status: DC

## 2021-08-20 NOTE — Telephone Encounter (Signed)
[facility] faxed refill request for the following medications: ? ?amLODipine (NORVASC) 5 MG tablet  ? ?omeprazole (PRILOSEC) 20 MG capsule  ? ?lisinopril (ZESTRIL) 40 MG tablet  ? ?Please advise. ? ?

## 2021-10-26 DIAGNOSIS — M25662 Stiffness of left knee, not elsewhere classified: Secondary | ICD-10-CM | POA: Diagnosis not present

## 2021-10-26 DIAGNOSIS — Z96659 Presence of unspecified artificial knee joint: Secondary | ICD-10-CM | POA: Diagnosis not present

## 2021-10-27 DIAGNOSIS — H401131 Primary open-angle glaucoma, bilateral, mild stage: Secondary | ICD-10-CM | POA: Diagnosis not present

## 2021-11-14 ENCOUNTER — Other Ambulatory Visit: Payer: Self-pay | Admitting: Family Medicine

## 2021-11-14 DIAGNOSIS — K219 Gastro-esophageal reflux disease without esophagitis: Secondary | ICD-10-CM

## 2021-11-14 DIAGNOSIS — I10 Essential (primary) hypertension: Secondary | ICD-10-CM

## 2021-11-26 ENCOUNTER — Other Ambulatory Visit: Payer: Self-pay | Admitting: Family Medicine

## 2021-11-26 DIAGNOSIS — K219 Gastro-esophageal reflux disease without esophagitis: Secondary | ICD-10-CM

## 2021-11-26 DIAGNOSIS — I10 Essential (primary) hypertension: Secondary | ICD-10-CM

## 2021-11-26 NOTE — Telephone Encounter (Signed)
Pt scheduled OV for 12/10/21 at 1440 with PCP Requested Prescriptions  Pending Prescriptions Disp Refills  . amLODipine (NORVASC) 5 MG tablet [Pharmacy Med Name: amLODIPine BESYLATE 5 MG TAB] 90 tablet 0    Sig: TAKE ONE TABLET BY MOUTH DAILY     Cardiovascular: Calcium Channel Blockers 2 Failed - 11/26/2021  4:37 PM      Failed - Last BP in normal range    BP Readings from Last 1 Encounters:  06/26/21 133/90         Passed - Last Heart Rate in normal range    Pulse Readings from Last 1 Encounters:  06/26/21 69         Passed - Valid encounter within last 6 months    Recent Outpatient Visits          5 months ago Symptomatic lesion of skin   [employer], Dani Gobble, PA-C   1 year ago Pre-op evaluation   [facility] Jerrol Banana., MD   2 years ago Pain and swelling of left knee   [facility] Carles Collet M, [place]   3 years ago Annual physical exam   [facility] Jerrol Banana., MD   3 years ago Essential (primary) hypertension   [facility] Jerrol Banana., MD      Future Appointments            In 2 weeks Jerrol Banana., MD [facility], [place]           . lisinopril (ZESTRIL) 40 MG tablet [Pharmacy Med Name: LISINOPRIL 40 MG TABLET] 90 tablet 0    Sig: TAKE ONE TABLET BY MOUTH DAILY     Cardiovascular:  ACE Inhibitors Failed - 11/26/2021  4:37 PM      Failed - Cr in normal range and within 180 days    Creatinine, Ser  Date Value Ref Range Status  06/26/2021 1.25 (H) 0.61 - 1.24 mg/dL Final         Failed - Last BP in normal range    BP Readings from Last 1 Encounters:  06/26/21 133/90         Passed - K in normal range and within 180 days    Potassium  Date Value Ref Range Status  06/26/2021 4.1 3.5 - 5.1 mmol/L Final         Passed - Patient is not pregnant      Passed - Valid encounter within last 6 months    Recent Outpatient  Visits          5 months ago Symptomatic lesion of skin   [employer], Dani Gobble, PA-C   1 year ago Pre-op evaluation   [facility] Jerrol Banana., MD   2 years ago Pain and swelling of left knee   [facility] Carles Collet M, [place]   3 years ago Annual physical exam   [facility] Jerrol Banana., MD   3 years ago Essential (primary) hypertension   [facility] Jerrol Banana., MD      Future Appointments            In 2 weeks Jerrol Banana., MD [facility], [place]           . omeprazole (PRILOSEC) 20 MG capsule [Pharmacy Med Name: OMEPRAZOLE DR 20 MG CAPSULE] 90 capsule 0  Sig: TAKE ONE CAPSULE BY MOUTH DAILY     Gastroenterology: Proton Pump Inhibitors Passed - 11/26/2021  4:37 PM      Passed - Valid encounter within last 12 months    Recent Outpatient Visits          5 months ago Symptomatic lesion of skin   [facility], Dani Gobble, PA-C   1 year ago Pre-op evaluation   [facility] Jerrol Banana., MD   2 years ago Pain and swelling of left knee   [facility] Carles Collet M, [place]   3 years ago Annual physical exam   [facility] Jerrol Banana., MD   3 years ago Essential (primary) hypertension   [facility] Jerrol Banana., MD      Future Appointments            In 2 weeks Jerrol Banana., MD [facility], [place]

## 2021-12-10 ENCOUNTER — Ambulatory Visit (INDEPENDENT_AMBULATORY_CARE_PROVIDER_SITE_OTHER): Payer: Medicare HMO | Admitting: Family Medicine

## 2021-12-10 ENCOUNTER — Other Ambulatory Visit: Payer: Self-pay

## 2021-12-10 ENCOUNTER — Encounter: Payer: Self-pay | Admitting: Family Medicine

## 2021-12-10 VITALS — BP 132/73 | HR 63 | Temp 98.2°F | Resp 16 | Wt 223.3 lb

## 2021-12-10 DIAGNOSIS — Z96652 Presence of left artificial knee joint: Secondary | ICD-10-CM

## 2021-12-10 DIAGNOSIS — Z1211 Encounter for screening for malignant neoplasm of colon: Secondary | ICD-10-CM | POA: Diagnosis not present

## 2021-12-10 DIAGNOSIS — I10 Essential (primary) hypertension: Secondary | ICD-10-CM

## 2021-12-10 DIAGNOSIS — Z0000 Encounter for general adult medical examination without abnormal findings: Secondary | ICD-10-CM

## 2021-12-10 DIAGNOSIS — E7800 Pure hypercholesterolemia, unspecified: Secondary | ICD-10-CM | POA: Diagnosis not present

## 2021-12-10 DIAGNOSIS — C8334 Diffuse large B-cell lymphoma, lymph nodes of axilla and upper limb: Secondary | ICD-10-CM | POA: Diagnosis not present

## 2021-12-10 MED ORDER — CLOTRIMAZOLE-BETAMETHASONE 1-0.05 % EX CREA: 1.0000 | TOPICAL_CREAM | Freq: Every day | CUTANEOUS | 0 refills | Status: DC | PRN

## 2021-12-10 NOTE — Progress Notes (Signed)
[place],acting as a scribe for Wilhemena Durie, MD.,have documented all relevant documentation on the behalf of Wilhemena Durie, MD,as directed by  Wilhemena Durie, MD while in the presence of Wilhemena Durie, MD.    Complete physical exam   Patient: Henry [name]   DOB: Aug 27, 1945   75 y.o. Male  MRN: [id number] Visit Date: 12/10/2021  Today's healthcare provider: Wilhemena Durie, MD   No chief complaint on file.  Subjective    Henry [name] is a 76 y.o. male who presents today for a complete physical exam.      Past Medical History:  Diagnosis Date   Allergy    Arthritis    Cancer ([place])    left axillary   Cataract    GERD (gastroesophageal reflux disease)    Hyperlipidemia    Hypertension    Pneumonia    Past Surgical History:  Procedure Laterality Date   CATARACT EXTRACTION Bilateral    COLONOSCOPY     MOLE REMOVAL     NECK MASS EXCISION     PORTACATH PLACEMENT N/A 12/29/2016   Procedure: INSERTION PORT-A-CATH;  Surgeon: Christene Lye, MD;  Location: [facility] ORS;  Service: General;  Laterality: N/A;   TONSILLECTOMY     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   Procedure: LEFT TOTAL KNEE ARTHROPLASTY;  Surgeon: Thornton Park, MD;  Location: [facility] ORS;  Service: Orthopedics;  Laterality: Left;   Social History   Socioeconomic History   Marital status: Married    Spouse name: Not on file   Number of children: 2   Years of education: Not on file   Highest education level: Some college, no degree  Occupational History   Occupation: retired  Tobacco Use   Smoking status: Never   Smokeless tobacco: Never  Vaping Use   Vaping Use: Never used  Substance and Sexual Activity   Alcohol use: Yes    Alcohol/week: 8.0 standard drinks of alcohol    Types: 8 Cans of beer per week    Comment: 2 beers four days a week   Drug use: No   Sexual activity: Not on file  Other Topics Concern   Not on file  Social History Narrative    Not on file   Social Determinants of Health   Financial Resource Strain: Low Risk  (05/12/2021)   Overall Financial Resource Strain (CARDIA)    Difficulty of Paying Living Expenses: Not hard at all  Food Insecurity: No Food Insecurity (05/12/2021)   Hunger Vital Sign    Worried About Running Out of Food in the Last Year: Never true    Ran Out of Food in the Last Year: Never true  Transportation Needs: No Transportation Needs (05/12/2021)   PRAPARE - Hydrologist (Medical): No    Lack of Transportation (Non-Medical): No  Physical Activity: Sufficiently Active (05/12/2021)   Exercise Vital Sign    Days of Exercise per Week: 5 days    Minutes of Exercise per Session: 40 min  Stress: No Stress Concern Present (05/12/2021)   [place]    Feeling of Stress : Not at all  Social Connections: [place] (05/12/2021)   Social Connection and Isolation Panel [NHANES]    Frequency of Communication with Friends and Family: More than three times a week    Frequency of Social Gatherings with Friends and Family: More than three times a week  Attends Religious Services: More than 4 times per year    Active Member of Clubs or Organizations: Yes    Attends Club or Organization Meetings: 1 to 4 times per year    Marital Status: Married  Human resources officer Violence: Not At Risk (05/12/2021)   Humiliation, Afraid, Rape, and Kick questionnaire    Fear of Current or Ex-Partner: No    Emotionally Abused: No    Physically Abused: No    Sexually Abused: No   Family Status  Relation Name Status   Mother  Deceased at age 39   Father  Deceased at age 55       lougaran disease   Sister  Alive   Daughter  Alive   Son  Alive   Family History  Problem Relation Age of Onset   Heart disease Mother    Rheumatic fever Mother    CAD Mother    Heart attack Father    COPD Sister    Allergies  Allergen  Reactions   Codeine Other (See Comments)    Gi upset   Lovastatin Other (See Comments)    Joint pain   Pravastatin Nausea And Vomiting    joint ache   Pravastatin Sodium     joint ache   Simvastatin Other (See Comments)    muscle ache    Statins    Sulfa Antibiotics Rash and Itching    Patient Care Team: Jerrol Banana., MD as PCP - General (Family Medicine) Cammie Sickle, MD as Consulting Physician (Medical Oncology) Noreene Filbert, MD as Referring Physician (Radiation Oncology) Anell Barr, OD as Consulting Physician (Optometry)   Medications: Outpatient Medications Prior to Visit  Medication Sig   acetaminophen (TYLENOL) 325 MG tablet Take 1-2 tablets (325-650 mg total) by mouth every 6 (six) hours as needed for mild pain (pain score 1-3 or temp > 100.5).   amLODipine (NORVASC) 5 MG tablet TAKE ONE TABLET BY MOUTH DAILY   aspirin EC 81 MG tablet Take 4 tablets (325 mg total) by mouth 2 (two) times daily.   bisacodyl (DULCOLAX) 5 MG EC tablet Take 1 tablet (5 mg total) by mouth daily as needed for moderate constipation.   cetirizine (ZYRTEC) 10 MG tablet Take 10 mg by mouth daily.   Cholecalciferol (VITAMIN D) 50 MCG (2000 UT) CAPS Take 2,000 Units by mouth daily.   clotrimazole-betamethasone (LOTRISONE) cream Apply 1 application topically daily as needed (Rash).   docusate sodium (COLACE) 50 MG capsule Take 50 mg by mouth daily.   latanoprost (XALATAN) 0.005 % ophthalmic solution SMARTSIG:In Eye(s)   lisinopril (ZESTRIL) 40 MG tablet TAKE ONE TABLET BY MOUTH DAILY   omeprazole (PRILOSEC) 20 MG capsule TAKE ONE CAPSULE BY MOUTH DAILY   phenylephrine-shark liver oil-mineral oil-petrolatum (PREPARATION H) 0.25-14-74.9 % rectal ointment Place 1 application rectally 2 (two) times daily as needed for hemorrhoids.   timolol (TIMOPTIC) 0.5 % ophthalmic solution SMARTSIG:In Eye(s)   TURMERIC PO Take by mouth.   ZINC GLUCONATE PO Take by mouth daily. Immune Health  (Patient not taking: Reported on 06/26/2021)   No facility-administered medications prior to visit.    Review of Systems  Constitutional: Negative.   HENT: Negative.    Eyes: Negative.   Respiratory: Negative.    Cardiovascular: Negative.   Gastrointestinal: Negative.   Endocrine: Negative.   Genitourinary: Negative.   Musculoskeletal: Negative.   Skin: Negative.   Allergic/Immunologic: Negative.   Neurological: Negative.   Hematological: Negative.   Psychiatric/Behavioral: Negative.  Objective    There were no vitals taken for this visit.    Physical Exam Constitutional:      Appearance: Normal appearance. He is normal weight.  HENT:     Head: Normocephalic and atraumatic.     Right Ear: Tympanic membrane, ear canal and external ear normal.     Left Ear: Tympanic membrane, ear canal and external ear normal.     Nose: Nose normal.     Mouth/Throat:     Mouth: Mucous membranes are moist.     Pharynx: Oropharynx is clear.  Eyes:     Extraocular Movements: Extraocular movements intact.     Conjunctiva/sclera: Conjunctivae normal.     Pupils: Pupils are equal, round, and reactive to light.  Cardiovascular:     Rate and Rhythm: Normal rate and regular rhythm.     Pulses: Normal pulses.     Heart sounds: Normal heart sounds.  Pulmonary:     Effort: Pulmonary effort is normal.     Breath sounds: Normal breath sounds.  Abdominal:     General: Abdomen is flat. Bowel sounds are normal.     Palpations: Abdomen is soft.  Genitourinary:    Penis: Normal.      Testes: Normal.  Musculoskeletal:     Cervical back: Normal range of motion and neck supple.  Skin:    General: Skin is warm and dry.  Neurological:     General: No focal deficit present.     Mental Status: He is alert and oriented to person, place, and time.  Psychiatric:        Mood and Affect: Mood normal.        Behavior: Behavior normal.        Thought Content: Thought content normal.         Judgment: Judgment normal.       Last depression screening scores    06/23/2021   11:54 AM 05/12/2021    1:08 PM 11/03/2020    8:41 AM  PHQ 2/9 Scores  PHQ - 2 Score 0 0 0  PHQ- 9 Score 0     Last fall risk screening    06/23/2021   11:54 AM  [place] in the past year? 0  Number falls in past yr: 0  Injury with Fall? 0  Risk for fall due to : No Fall Risks  Follow up Falls evaluation completed   Last Audit-C alcohol use screening    06/23/2021   11:54 AM  Alcohol Use Disorder Test (AUDIT)  1. How often do you have a drink containing alcohol? 3  2. How many drinks containing alcohol do you have on a typical day when you are drinking? 0  3. How often do you have six or more drinks on one occasion? 0  AUDIT-C Score 3  4. How often during the last year have you found that you were not able to stop drinking once you had started? 0  5. How often during the last year have you failed to do what was normally expected from you because of drinking? 0  6. How often during the last year have you needed a first drink in the morning to get yourself going after a heavy drinking session? 0  7. How often during the last year have you had a feeling of guilt of remorse after drinking? 0  8. How often during the last year have you been unable to remember what happened the night before  because you had been drinking? 0  9. Have you or someone else been injured as a result of your drinking? 0  10. Has a relative or friend or a doctor or another health worker been concerned about your drinking or suggested you cut down? 0  Alcohol Use Disorder Identification Test Final Score (AUDIT) 3   A score of 3 or more in women, and 4 or more in men indicates increased risk for alcohol abuse, EXCEPT if all of the points are from question 1   No results found for any visits on 12/10/21.  Assessment & Plan    Routine Health Maintenance and Physical Exam  Exercise Activities and Dietary  recommendations  Goals      DIET - EAT MORE FRUITS AND VEGETABLES     DIET - INCREASE WATER INTAKE     Recommend increasing water intake to 4-6 glasses a day.          Immunization History  Administered Date(s) Administered   Fluad Quad(high Dose 65+) 02/14/2019, 03/20/2020   Influenza Split 04/01/2007, 02/20/2010, 03/25/2011, 03/30/2012   Influenza, High Dose Seasonal PF 02/19/2014, 03/05/2015, 03/18/2016, 02/21/2018   Influenza,inj,Quad PF,6+ Mos 02/21/2013, 03/09/2017   Moderna Sars-Covid-2 Vaccination 06/29/2019, 07/27/2019   Pneumococcal Conjugate-13 04/24/2013   Pneumococcal Polysaccharide-23 05/16/2014   Td 09/18/2018   Tdap 01/19/2005   Zoster Recombinat (Shingrix) 12/07/2017, 04/01/2018   Zoster, Live 03/30/2012    Health Maintenance  Topic Date Due   COVID-19 Vaccine (3 - Moderna risk series) 08/24/2019   COLONOSCOPY (Pts 45-19yr Insurance coverage will need to be confirmed)  12/16/2020   INFLUENZA VACCINE  12/15/2021   TETANUS/TDAP  09/17/2028   Pneumonia Vaccine 76 Years old  Completed   Hepatitis C Screening  Completed   Zoster Vaccines- Shingrix  Completed   HPV VACCINES  Aged Out    Discussed health benefits of physical activity, and encouraged him to engage in regular exercise appropriate for his age and condition.  1. Annual physical exam   2. Colon cancer screening  - Ambulatory referral to Gastroenterology  3. Essential hypertension  - CBC with Differential/Platelet - Comprehensive metabolic panel - TSH  4. 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 S/P TKR (total knee replacement) using cement, left    No follow-ups on file.     I, RWilhemena Durie MD, have reviewed all documentation for this visit. The documentation on 12/11/21 for the exam, diagnosis, procedures, and orders are all accurate and  complete.    Casy Tavano GCranford Mon MD  [phone](phone) [phone](fax)  [place]

## 2021-12-11 DIAGNOSIS — I10 Essential (primary) hypertension: Secondary | ICD-10-CM | POA: Diagnosis not present

## 2021-12-11 DIAGNOSIS — E7800 Pure hypercholesterolemia, unspecified: Secondary | ICD-10-CM | POA: Diagnosis not present

## 2021-12-12 LAB — COMPREHENSIVE METABOLIC PANEL
ALT: 9 IU/L (ref 0–44)
AST: 12 IU/L (ref 0–40)
Albumin/Globulin Ratio: 2 (ref 1.2–2.2)
Albumin: 4.1 g/dL (ref 3.8–4.8)
Alkaline Phosphatase: 84 IU/L (ref 44–121)
BUN/Creatinine Ratio: 10 (ref 10–24)
BUN: 14 mg/dL (ref 8–27)
Bilirubin Total: 0.4 mg/dL (ref 0.0–1.2)
CO2: 20 mmol/L (ref 20–29)
Calcium: 9.5 mg/dL (ref 8.6–10.2)
Chloride: 106 mmol/L (ref 96–106)
Creatinine, Ser: 1.42 mg/dL — ABNORMAL HIGH (ref 0.76–1.27)
Globulin, Total: 2.1 g/dL (ref 1.5–4.5)
Glucose: 92 mg/dL (ref 70–99)
Potassium: 4.4 mmol/L (ref 3.5–5.2)
Sodium: 142 mmol/L (ref 134–144)
Total Protein: 6.2 g/dL (ref 6.0–8.5)
eGFR: 52 mL/min/{1.73_m2} — ABNORMAL LOW (ref >59)

## 2021-12-12 LAB — CBC WITH DIFFERENTIAL/PLATELET
Basophils Absolute: 0.1 10*3/uL (ref 0.0–0.2)
Basos: 1 %
EOS (ABSOLUTE): 0.3 10*3/uL (ref 0.0–0.4)
Eos: 4 %
Hematocrit: 42.6 % (ref 37.5–51.0)
Hemoglobin: 14.5 g/dL (ref 13.0–17.7)
Immature Grans (Abs): 0 10*3/uL (ref 0.0–0.1)
Immature Granulocytes: 0 %
Lymphocytes Absolute: 1 10*3/uL (ref 0.7–3.1)
Lymphs: 15 %
MCH: 31 pg (ref 26.6–33.0)
MCHC: 34 g/dL (ref 31.5–35.7)
MCV: 91 fL (ref 79–97)
Monocytes Absolute: 0.5 10*3/uL (ref 0.1–0.9)
Monocytes: 8 %
Neutrophils Absolute: 4.7 10*3/uL (ref 1.4–7.0)
Neutrophils: 72 %
Platelets: 188 10*3/uL (ref 150–450)
RBC: 4.68 x10E6/uL (ref 4.14–5.80)
RDW: 12.9 % (ref 11.6–15.4)
WBC: 6.5 10*3/uL (ref 3.4–10.8)

## 2021-12-12 LAB — LIPID PANEL WITH LDL/HDL RATIO
Cholesterol, Total: 201 mg/dL — ABNORMAL HIGH (ref 100–199)
HDL: 29 mg/dL — ABNORMAL LOW (ref >39)
LDL Chol Calc (NIH): 147 mg/dL — ABNORMAL HIGH (ref 0–99)
LDL/HDL Ratio: 5.1 ratio — ABNORMAL HIGH (ref 0.0–3.6)
Triglycerides: 139 mg/dL (ref 0–149)
VLDL Cholesterol Cal: 25 mg/dL (ref 5–40)

## 2021-12-12 LAB — TSH: TSH: 1.82 u[IU]/mL (ref 0.450–4.500)

## 2021-12-25 ENCOUNTER — Inpatient Hospital Stay: Payer: Medicare HMO | Admitting: Internal Medicine

## 2021-12-25 ENCOUNTER — Inpatient Hospital Stay: Payer: Medicare HMO | Attending: Oncology

## 2021-12-25 ENCOUNTER — Encounter: Payer: Self-pay | Admitting: Internal Medicine

## 2021-12-25 DIAGNOSIS — Z8572 Personal history of non-Hodgkin lymphomas: Secondary | ICD-10-CM | POA: Diagnosis not present

## 2021-12-25 DIAGNOSIS — C8334 Diffuse large B-cell lymphoma, lymph nodes of axilla and upper limb: Secondary | ICD-10-CM

## 2021-12-25 DIAGNOSIS — N183 Chronic kidney disease, stage 3 unspecified: Secondary | ICD-10-CM | POA: Insufficient documentation

## 2021-12-25 LAB — CBC WITH DIFFERENTIAL/PLATELET
Abs Immature Granulocytes: 0.01 10*3/uL (ref 0.00–0.07)
Basophils Absolute: 0.1 10*3/uL (ref 0.0–0.1)
Basophils Relative: 1 %
Eosinophils Absolute: 0.3 10*3/uL (ref 0.0–0.5)
Eosinophils Relative: 4 %
HCT: 43.2 % (ref 39.0–52.0)
Hemoglobin: 14.8 g/dL (ref 13.0–17.0)
Immature Granulocytes: 0 %
Lymphocytes Relative: 14 %
Lymphs Abs: 1.1 10*3/uL (ref 0.7–4.0)
MCH: 31.2 pg (ref 26.0–34.0)
MCHC: 34.3 g/dL (ref 30.0–36.0)
MCV: 91.1 fL (ref 80.0–100.0)
Monocytes Absolute: 0.6 10*3/uL (ref 0.1–1.0)
Monocytes Relative: 8 %
Neutro Abs: 5.6 10*3/uL (ref 1.7–7.7)
Neutrophils Relative %: 73 %
Platelets: 208 10*3/uL (ref 150–400)
RBC: 4.74 MIL/uL (ref 4.22–5.81)
RDW: 12.8 % (ref 11.5–15.5)
WBC: 7.7 10*3/uL (ref 4.0–10.5)
nRBC: 0 % (ref 0.0–0.2)

## 2021-12-25 LAB — COMPREHENSIVE METABOLIC PANEL
ALT: 12 U/L (ref 0–44)
AST: 16 U/L (ref 15–41)
Albumin: 3.9 g/dL (ref 3.5–5.0)
Alkaline Phosphatase: 74 U/L (ref 38–126)
Anion gap: 10 (ref 5–15)
BUN: 16 mg/dL (ref 8–23)
CO2: 23 mmol/L (ref 22–32)
Calcium: 9.2 mg/dL (ref 8.9–10.3)
Chloride: 106 mmol/L (ref 98–111)
Creatinine, Ser: 1.26 mg/dL — ABNORMAL HIGH (ref 0.61–1.24)
GFR, Estimated: >60 mL/min (ref >60)
Glucose, Bld: 94 mg/dL (ref 70–99)
Potassium: 4.3 mmol/L (ref 3.5–5.1)
Sodium: 139 mmol/L (ref 135–145)
Total Bilirubin: 0.5 mg/dL (ref 0.3–1.2)
Total Protein: 6.9 g/dL (ref 6.5–8.1)

## 2021-12-25 LAB — LACTATE DEHYDROGENASE: LDH: 112 U/L (ref 98–192)

## 2021-12-25 NOTE — Assessment & Plan Note (Signed)
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  #  No evidence of progression or recurrence [stable left axillary 1 cm lymph node]- STABLE  # CKD -stage II- III- STABLE  # DISPOSITION: # follow up in 12  months-MD/cbc/cmp/ldh/Dr.B

## 2021-12-25 NOTE — Progress Notes (Signed)
[place] OFFICE PROGRESS NOTE  Patient Care Team: Jerrol Banana., MD as PCP - General (Family Medicine) Cammie Sickle, MD as Consulting Physician (Medical Oncology) Noreene Filbert, MD as Referring Physician (Radiation Oncology) Anell Barr, OD as Consulting Physician (Optometry)   Cancer Staging  No matching staging information was found for the patient.   Oncology History Overview Note  # AUG 6th 2018- DLBCL STAGE I [LEFT axilla- Core Bx- Dr.Sankar; CD-20, bcl-2;bcl-6 positive]; Ki- 67 >90%. MUM-1 NEG. GCB subtype; Myc- POS; FISH studies-NEGATIVE for gene RE-ARRANGEMENTS [positive for extra-copies of bcl-6/ myc/IgH/bcl-2]PET scan- Right Ax LN [4.5cm; SUV 35]  # R-CHOP [aug 20th 2018] x3 cycles; RT [starting 10/25; Nov 266 2018]; DEc 2018- CR ; except 8 mm left ax LN- no uptake.   # AUG 13th 2018. MUGA scan-56%.   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  Ambulating independently.   Henry [name] 76 y.o.  male pleasant patient above history of diffuse large B-cell lymphoma is here for follow-up.  Patient overall is doing well.  Denies any new lumps or bumps.  No fevers or chills.  Review of Systems  Constitutional:  Negative for chills, diaphoresis, fever and weight loss.  HENT:  Negative for nosebleeds and sore throat.   Eyes:  Negative for double vision.  Respiratory:  Negative for cough, hemoptysis, sputum production, shortness of breath and wheezing.   Cardiovascular:  Negative for chest pain, palpitations, orthopnea and leg swelling.  Gastrointestinal:  Negative for abdominal pain, blood in stool, constipation, diarrhea, heartburn, melena, nausea and vomiting.  Genitourinary:  Negative for dysuria, frequency and urgency.  Musculoskeletal:  Positive for joint pain. Negative for back pain.   Skin: Negative.  Negative for itching and rash.  Neurological:  Negative for dizziness, focal weakness, weakness and headaches.  Endo/Heme/Allergies:  Does not bruise/bleed easily.  Psychiatric/Behavioral:  Negative for depression. The patient is not nervous/anxious and does not have insomnia.       PAST MEDICAL HISTORY :  Past Medical History:  Diagnosis Date   Allergy    Arthritis    Cancer ([place])    left axillary   Cataract    GERD (gastroesophageal reflux disease)    Hyperlipidemia    Hypertension    Pneumonia     PAST SURGICAL HISTORY :   Past Surgical History:  Procedure Laterality Date   CATARACT EXTRACTION Bilateral    COLONOSCOPY     MOLE REMOVAL     NECK MASS EXCISION     PORTACATH PLACEMENT N/A 12/29/2016   Procedure: INSERTION PORT-A-CATH;  Surgeon: Christene Lye, MD;  Location: [facility] ORS;  Service: General;  Laterality: N/A;   TONSILLECTOMY     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   Procedure: LEFT TOTAL KNEE ARTHROPLASTY;  Surgeon: Thornton Park, MD;  Location: [facility] ORS;  Service: Orthopedics;  Laterality: Left;    FAMILY HISTORY :   Family History  Problem Relation Age of Onset   Heart disease Mother    Rheumatic fever Mother    CAD Mother    Heart attack Father    COPD Sister     SOCIAL HISTORY:   Social History   Tobacco Use   Smoking status: Never   Smokeless tobacco: Never  Vaping Use   Vaping Use: Never  used  Substance Use Topics   Alcohol use: Yes    Alcohol/week: 8.0 standard drinks of alcohol    Types: 8 Cans of beer per week    Comment: 2 beers four days a week   Drug use: No    ALLERGIES:  is allergic to codeine, lovastatin, pravastatin, pravastatin sodium, simvastatin, statins, and sulfa antibiotics.  MEDICATIONS:  Current Outpatient Medications  Medication Sig Dispense Refill   acetaminophen (TYLENOL) 325 MG tablet Take 1-2 tablets (325-650 mg total) by mouth every 6 (six) hours as needed for mild pain (pain  score 1-3 or temp > 100.5). 60 tablet 1   amLODipine (NORVASC) 5 MG tablet TAKE ONE TABLET BY MOUTH DAILY 90 tablet 0   aspirin EC 81 MG tablet Take 4 tablets (325 mg total) by mouth 2 (two) times daily. 90 tablet 11   bisacodyl (DULCOLAX) 5 MG EC tablet Take 1 tablet (5 mg total) by mouth daily as needed for moderate constipation. 30 tablet 0   brimonidine-timolol (COMBIGAN) 0.2-0.5 % ophthalmic solution      cetirizine (ZYRTEC) 10 MG tablet Take 10 mg by mouth daily.     Cholecalciferol (VITAMIN D) 50 MCG (2000 UT) CAPS Take 2,000 Units by mouth daily.     clotrimazole-betamethasone (LOTRISONE) cream Apply 1 Application topically daily as needed (Rash). 30 g 0   docusate sodium (COLACE) 50 MG capsule Take 50 mg by mouth daily.     latanoprost (XALATAN) 0.005 % ophthalmic solution SMARTSIG:In Eye(s)     lisinopril (ZESTRIL) 40 MG tablet TAKE ONE TABLET BY MOUTH DAILY 90 tablet 0   omeprazole (PRILOSEC) 20 MG capsule TAKE ONE CAPSULE BY MOUTH DAILY 90 capsule 0   phenylephrine-shark liver oil-mineral oil-petrolatum (PREPARATION H) 0.25-14-74.9 % rectal ointment Place 1 application rectally 2 (two) times daily as needed for hemorrhoids.     timolol (TIMOPTIC) 0.5 % ophthalmic solution SMARTSIG:In Eye(s)     ZINC GLUCONATE PO Take by mouth daily. Immune Health     TURMERIC PO Take by mouth. (Patient not taking: Reported on 12/25/2021)     No current facility-administered medications for this visit.    PHYSICAL EXAMINATION: ECOG PERFORMANCE STATUS: 0 - Asymptomatic  BP 124/83 (BP Location: Left Arm, Patient Position: Sitting)   Pulse 62   Temp 97.7 F (36.5 C) (Tympanic)   Resp 16   Wt 222 lb (100.7 kg)   BMI 28.50 kg/m   Filed Weights   12/25/21 1000  Weight: 222 lb (100.7 kg)   Approximately 1 cm lymph node found in the left axilla-stable not any worse  Physical Exam HENT:     Head: Normocephalic and atraumatic.     Mouth/Throat:     Pharynx: No oropharyngeal exudate.  Eyes:      Pupils: Pupils are equal, round, and reactive to light.  Cardiovascular:     Rate and Rhythm: Normal rate and regular rhythm.  Pulmonary:     Effort: No respiratory distress.     Breath sounds: No wheezing.  Abdominal:     General: Bowel sounds are normal. There is no distension.     Palpations: Abdomen is soft. There is no mass.     Tenderness: There is no abdominal tenderness. There is no guarding or rebound.  Musculoskeletal:        General: No tenderness. Normal range of motion.     Cervical back: Normal range of motion and neck supple.  Skin:    General: Skin is warm.  Neurological:  Mental Status: He is alert and oriented to person, place, and time.  Psychiatric:        Mood and Affect: Affect normal.        LABORATORY DATA:  I have reviewed the data as listed    Component Value Date/Time   NA 139 12/25/2021 1005   NA 142 12/11/2021 0813   K 4.3 12/25/2021 1005   CL 106 12/25/2021 1005   CO2 23 12/25/2021 1005   GLUCOSE 94 12/25/2021 1005   BUN 16 12/25/2021 1005   BUN 14 12/11/2021 0813   CREATININE 1.26 (H) 12/25/2021 1005   CALCIUM 9.2 12/25/2021 1005   PROT 6.9 12/25/2021 1005   PROT 6.2 12/11/2021 0813   ALBUMIN 3.9 12/25/2021 1005   ALBUMIN 4.1 12/11/2021 0813   AST 16 12/25/2021 1005   ALT 12 12/25/2021 1005   ALKPHOS 74 12/25/2021 1005   BILITOT 0.5 12/25/2021 1005   BILITOT 0.4 12/11/2021 0813   GFRNONAA >60 12/25/2021 1005   GFRAA >60 12/26/2019 0954    No results found for: "SPEP", "UPEP"  Lab Results  Component Value Date   WBC 7.7 12/25/2021   NEUTROABS 5.6 12/25/2021   HGB 14.8 12/25/2021   HCT 43.2 12/25/2021   MCV 91.1 12/25/2021   PLT 208 12/25/2021      Chemistry      Component Value Date/Time   NA 139 12/25/2021 1005   NA 142 12/11/2021 0813   K 4.3 12/25/2021 1005   CL 106 12/25/2021 1005   CO2 23 12/25/2021 1005   BUN 16 12/25/2021 1005   BUN 14 12/11/2021 0813   CREATININE 1.26 (H) 12/25/2021 1005   GLU 91  11/06/2013 0000      Component Value Date/Time   CALCIUM 9.2 12/25/2021 1005   ALKPHOS 74 12/25/2021 1005   AST 16 12/25/2021 1005   ALT 12 12/25/2021 1005   BILITOT 0.5 12/25/2021 1005   BILITOT 0.4 12/11/2021 0813       RADIOGRAPHIC STUDIES: I have personally reviewed the radiological images as listed and agreed with the findings in the report. No results found.   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  #  No evidence of progression or recurrence [stable left axillary 1 cm lymph node]- STABLE  # CKD -stage II- III- STABLE  # DISPOSITION: # follow up in 12  months-MD/cbc/cmp/ldh/Dr.B   No orders of the defined types were placed in this encounter.  All questions were answered. The patient knows to call the clinic with any problems, questions or concerns.      Cammie Sickle, MD 12/25/2021 10:42 AM

## 2021-12-25 NOTE — Progress Notes (Signed)
Patient denies new problems/concerns today.   °

## 2022-01-29 DIAGNOSIS — Z008 Encounter for other general examination: Secondary | ICD-10-CM | POA: Diagnosis not present

## 2022-01-29 DIAGNOSIS — K219 Gastro-esophageal reflux disease without esophagitis: Secondary | ICD-10-CM | POA: Diagnosis not present

## 2022-01-29 DIAGNOSIS — Z8572 Personal history of non-Hodgkin lymphomas: Secondary | ICD-10-CM | POA: Diagnosis not present

## 2022-01-29 DIAGNOSIS — H409 Unspecified glaucoma: Secondary | ICD-10-CM | POA: Diagnosis not present

## 2022-01-29 DIAGNOSIS — Z8249 Family history of ischemic heart disease and other diseases of the circulatory system: Secondary | ICD-10-CM | POA: Diagnosis not present

## 2022-01-29 DIAGNOSIS — I10 Essential (primary) hypertension: Secondary | ICD-10-CM | POA: Diagnosis not present

## 2022-01-29 DIAGNOSIS — Z882 Allergy status to sulfonamides status: Secondary | ICD-10-CM | POA: Diagnosis not present

## 2022-02-21 ENCOUNTER — Other Ambulatory Visit: Payer: Self-pay | Admitting: Family Medicine

## 2022-02-21 DIAGNOSIS — I10 Essential (primary) hypertension: Secondary | ICD-10-CM

## 2022-02-21 DIAGNOSIS — K219 Gastro-esophageal reflux disease without esophagitis: Secondary | ICD-10-CM

## 2022-02-22 NOTE — Telephone Encounter (Signed)
Called pt - [id number] for appt.  

## 2022-02-22 NOTE — Telephone Encounter (Signed)
Requested medications are due for refill today.  yes  Requested medications are on the active medications list.  yes  Last refill. 11/26/2021 #90 0 rf for all 3  Future visit scheduled.   no  Notes to clinic.  Called pt - [facility] for appt. Dr. Rosanna Randy pt.    Requested Prescriptions  Pending Prescriptions Disp Refills   lisinopril (ZESTRIL) 40 MG tablet [Pharmacy Med Name: LISINOPRIL 40 MG TABLET] 90 tablet 0    Sig: TAKE 1 TABLET BY MOUTH DAILY     Cardiovascular:  ACE Inhibitors Failed - 02/21/2022  6:51 AM      Failed - Cr in normal range and within 180 days    Creatinine, Ser  Date Value Ref Range Status  12/25/2021 1.26 (H) 0.61 - 1.24 mg/dL Final         Passed - K in normal range and within 180 days    Potassium  Date Value Ref Range Status  12/25/2021 4.3 3.5 - 5.1 mmol/L Final         Passed - Patient is not pregnant      Passed - Last BP in normal range    BP Readings from Last 1 Encounters:  12/25/21 124/83         Passed - Valid encounter within last 6 months    Recent Outpatient Visits           2 months ago Annual physical exam   [facility] Jerrol Banana., MD   8 months ago Symptomatic lesion of skin   [facility] Mecum, Dani Gobble, PA-C   1 year ago Pre-op evaluation   [facility] Jerrol Banana., MD   2 years ago Pain and swelling of left knee   [facility] Carles Collet M, [place]   3 years ago Annual physical exam   [facility] Jerrol Banana., MD       Future Appointments             In 9 months Jerrol Banana., MD [facility]             omeprazole (PRILOSEC) 20 MG capsule [Pharmacy Med Name: OMEPRAZOLE DR 20 MG CAPSULE] 90 capsule 0    Sig: TAKE 1 CAPSULE BY MOUTH DAILY     Gastroenterology: Proton Pump Inhibitors Passed - 02/21/2022  6:51 AM      Passed - Valid encounter within last 12 months    Recent  Outpatient Visits           2 months ago Annual physical exam   [facility] [place] Jerrol Banana., MD   8 months ago Symptomatic lesion of skin   [place], Dani Gobble, PA-C   1 year ago Pre-op evaluation   [facility] Jerrol Banana., MD   2 years ago Pain and swelling of left knee   [facility] Carles Collet M, [place]   3 years ago Annual physical exam   [facility] Jerrol Banana., MD       Future Appointments             In 9 months Jerrol Banana., MD [facility]             amLODipine (NORVASC) 5 MG tablet [Pharmacy Med Name: amLODIPine BESYLATE 5 MG TAB] 90 tablet 0    Sig: TAKE  1 TABLET BY MOUTH DAILY     Cardiovascular: Calcium Channel Blockers 2 Passed - 02/21/2022  6:51 AM      Passed - Last BP in normal range    BP Readings from Last 1 Encounters:  12/25/21 124/83         Passed - Last Heart Rate in normal range    Pulse Readings from Last 1 Encounters:  12/25/21 62         Passed - Valid encounter within last 6 months    Recent Outpatient Visits           2 months ago Annual physical exam   [facility] Jerrol Banana., MD   8 months ago Symptomatic lesion of skin   [facility] Mecum, Dani Gobble, PA-C   1 year ago Pre-op evaluation   [facility] Jerrol Banana., MD   2 years ago Pain and swelling of left knee   [facility] Carles Collet M, [place]   3 years ago Annual physical exam   [facility] Jerrol Banana., MD       Future Appointments             In 9 months Jerrol Banana., MD [facility] [place]

## 2022-03-10 ENCOUNTER — Ambulatory Visit (INDEPENDENT_AMBULATORY_CARE_PROVIDER_SITE_OTHER): Payer: Medicare HMO | Admitting: Physician Assistant

## 2022-03-10 ENCOUNTER — Encounter: Payer: Self-pay | Admitting: Physician Assistant

## 2022-03-10 VITALS — BP 136/90 | HR 71 | Temp 98.3°F | Resp 14 | Wt 228.0 lb

## 2022-03-10 DIAGNOSIS — M25559 Pain in unspecified hip: Secondary | ICD-10-CM | POA: Diagnosis not present

## 2022-03-10 DIAGNOSIS — M5441 Lumbago with sciatica, right side: Secondary | ICD-10-CM

## 2022-03-10 DIAGNOSIS — Z23 Encounter for immunization: Secondary | ICD-10-CM | POA: Diagnosis not present

## 2022-03-10 NOTE — Progress Notes (Signed)
     I,Roshena L Chambers,acting as a Education administrator for Henry Sachs, PA-C.,have documented all relevant documentation on the behalf of Mardene Speak, PA-C,as directed by  Henry Sachs, PA-C while in the presence of Henry Sachs, PA-C.     Established patient visit   Patient: Henry [name]   DOB: 08-03-1945   76 y.o. Male  MRN: [id number] Visit Date: 03/10/2022  Today's healthcare provider: Mardene Speak, PA-C   Chief Complaint  Patient presents with  . Leg Pain   Subjective    Leg Pain  Incident onset: 11 days ago. There was no injury mechanism. The pain is present in the left leg, right leg, left hip and right hip. The quality of the pain is described as cramping. Pertinent negatives include no inability to bear weight, loss of motion, loss of sensation, muscle weakness, numbness or tingling. He has tried acetaminophen and NSAIDs for the symptoms. The treatment provided no relief.    Hx of injury.  You are more likely to have this problem if you have had an injury to your spine, pelvis, or buttocks. Someone with a history of being pregnant a few times is more likely to have problems with her SI joint. Legs that are not the same length can cause pain as well. Some infections may cause problems with the SI joint   Review of Systems  Constitutional:  Negative for appetite change, chills and fever.  Respiratory:  Negative for chest tightness, shortness of breath and wheezing.   Cardiovascular:  Negative for chest pain and palpitations.  Gastrointestinal:  Negative for abdominal pain, nausea and vomiting.  Musculoskeletal:  Positive for arthralgias (hip pain) and myalgias.  Neurological:  Negative for tingling and numbness.    {Labs  Heme  Chem  Endocrine  Serology  Results Review (optional):23779}   Objective    BP (!) 136/90 (BP Location: Left Arm, Patient Position: Sitting, Cuff Size: Large)   Pulse 71   Temp 98.3 F (36.8 C) (Oral)   Resp 14   Wt 228 lb (103.4 kg)    SpO2 99% Comment: room air  BMI 29.27 kg/m  {Show previous vital signs (optional):23777}  Physical Exam  ***  No results found for any visits on 03/10/22.  Assessment & Plan     ***  No follow-ups on file.      {provider attestation***:1}   Mardene Speak, Hershal Coria  [facility] [phone] (phone) [phone] (fax)  [place]

## 2022-03-12 DIAGNOSIS — M545 Low back pain, unspecified: Secondary | ICD-10-CM | POA: Diagnosis not present

## 2022-04-05 DIAGNOSIS — M545 Low back pain, unspecified: Secondary | ICD-10-CM | POA: Diagnosis not present

## 2022-04-30 DIAGNOSIS — M545 Low back pain, unspecified: Secondary | ICD-10-CM | POA: Diagnosis not present

## 2022-05-13 ENCOUNTER — Ambulatory Visit (INDEPENDENT_AMBULATORY_CARE_PROVIDER_SITE_OTHER): Payer: Medicare HMO

## 2022-05-13 VITALS — Wt 228.0 lb

## 2022-05-13 DIAGNOSIS — Z0000 Encounter for general adult medical examination without abnormal findings: Secondary | ICD-10-CM | POA: Diagnosis not present

## 2022-05-13 NOTE — Patient Instructions (Signed)
Henry [name] , Thank you for taking time to come for your Medicare Wellness Visit. I appreciate your ongoing commitment to your health goals. Please review the following plan we discussed and let me know if I can assist you in the future.   These are the goals we discussed:  Goals      DIET - EAT MORE FRUITS AND VEGETABLES     DIET - INCREASE WATER INTAKE     Recommend increasing water intake to 4-6 glasses a day.      Patient Stated     Walk as much as I can         This is a list of the screening recommended for you and due dates:  Health Maintenance  Topic Date Due   COVID-19 Vaccine (3 - Moderna risk series) 08/24/2019   Colon Cancer Screening  05/14/2023*   Medicare Annual Wellness Visit  05/14/2023   DTaP/Tdap/Td vaccine (3 - Td or Tdap) 09/17/2028   Pneumonia Vaccine  Completed   Flu Shot  Completed   Hepatitis C Screening: USPSTF Recommendation to screen - Ages 18-79 yo.  Completed   Zoster (Shingles) Vaccine  Completed   HPV Vaccine  Aged Out  *Topic was postponed. The date shown is not the original due date.    Advanced directives: Please bring a copy of your health care power of attorney and living will to the office at your convenience.  Conditions/risks identified: get more walking in   Next appointment: Follow up in one year for your annual wellness visit.   Preventive Care 74 Years and Older, Male  Preventive care refers to lifestyle choices and visits with your health care provider that can promote health and wellness. What does preventive care include? A yearly physical exam. This is also called an annual well check. Dental exams once or twice a year. Routine eye exams. Ask your health care provider how often you should have your eyes checked. Personal lifestyle choices, including: Daily care of your teeth and gums. Regular physical activity. Eating a healthy diet. Avoiding tobacco and drug use. Limiting alcohol use. Practicing safe sex. Taking low  doses of aspirin every day. Taking vitamin and mineral supplements as recommended by your health care provider. What happens during an annual well check? The services and screenings done by your health care provider during your annual well check will depend on your age, overall health, lifestyle risk factors, and family history of disease. Counseling  Your health care provider may ask you questions about your: Alcohol use. Tobacco use. Drug use. Emotional well-being. Home and relationship well-being. Sexual activity. Eating habits. History of falls. Memory and ability to understand (cognition). Work and work Statistician. Screening  You may have the following tests or measurements: Height, weight, and BMI. Blood pressure. Lipid and cholesterol levels. These may be checked every 5 years, or more frequently if you are over 58 years old. Skin check. Lung cancer screening. You may have this screening every year starting at age 36 if you have a 30-pack-year history of smoking and currently smoke or have quit within the past 15 years. Fecal occult blood test (FOBT) of the stool. You may have this test every year starting at age 21. Flexible sigmoidoscopy or colonoscopy. You may have a sigmoidoscopy every 5 years or a colonoscopy every 10 years starting at age 83. Prostate cancer screening. Recommendations will vary depending on your family history and other risks. Hepatitis C blood test. Hepatitis B blood test. Sexually transmitted  disease (STD) testing. Diabetes screening. This is done by checking your blood sugar (glucose) after you have not eaten for a while (fasting). You may have this done every 1-3 years. Abdominal aortic aneurysm (AAA) screening. You may need this if you are a current or former smoker. Osteoporosis. You may be screened starting at age 38 if you are at high risk. Talk with your health care provider about your test results, treatment options, and if necessary, the need  for more tests. Vaccines  Your health care provider may recommend certain vaccines, such as: Influenza vaccine. This is recommended every year. Tetanus, diphtheria, and acellular pertussis (Tdap, Td) vaccine. You may need a Td booster every 10 years. Zoster vaccine. You may need this after age 33. Pneumococcal 13-valent conjugate (PCV13) vaccine. One dose is recommended after age 34. Pneumococcal polysaccharide (PPSV23) vaccine. One dose is recommended after age 14. Talk to your health care provider about which screenings and vaccines you need and how often you need them. This information is not intended to replace advice given to you by your health care provider. Make sure you discuss any questions you have with your health care provider. Document Released: 05/30/2015 Document Revised: 01/21/2016 Document Reviewed: 03/04/2015 Elsevier Interactive Patient Education  2017 [place] Prevention in the Home Falls can cause injuries. They can happen to people of all ages. There are many things you can do to make your home safe and to help prevent falls. What can I do on the outside of my home? Regularly fix the edges of walkways and driveways and fix any cracks. Remove anything that might make you trip as you walk through a door, such as a raised step or threshold. Trim any bushes or trees on the path to your home. Use bright outdoor lighting. Clear any walking paths of anything that might make someone trip, such as rocks or tools. Regularly check to see if handrails are loose or broken. Make sure that both sides of any steps have handrails. Any raised decks and porches should have guardrails on the edges. Have any leaves, snow, or ice cleared regularly. Use sand or salt on walking paths during winter. Clean up any spills in your garage right away. This includes oil or grease spills. What can I do in the bathroom? Use night lights. Install grab bars by the toilet and in the tub and  shower. Do not use towel bars as grab bars. Use non-skid mats or decals in the tub or shower. If you need to sit down in the shower, use a plastic, non-slip stool. Keep the floor dry. Clean up any water that spills on the floor as soon as it happens. Remove soap buildup in the tub or shower regularly. Attach bath mats securely with double-sided non-slip rug tape. Do not have throw rugs and other things on the floor that can make you trip. What can I do in the bedroom? Use night lights. Make sure that you have a light by your bed that is easy to reach. Do not use any sheets or blankets that are too big for your bed. They should not hang down onto the floor. Have a firm chair that has side arms. You can use this for support while you get dressed. Do not have throw rugs and other things on the floor that can make you trip. What can I do in the kitchen? Clean up any spills right away. Avoid walking on wet floors. Keep items that you use a  lot in easy-to-reach places. If you need to reach something above you, use a strong step stool that has a grab bar. Keep electrical cords out of the way. Do not use floor polish or wax that makes floors slippery. If you must use wax, use non-skid floor wax. Do not have throw rugs and other things on the floor that can make you trip. What can I do with my stairs? Do not leave any items on the stairs. Make sure that there are handrails on both sides of the stairs and use them. Fix handrails that are broken or loose. Make sure that handrails are as long as the stairways. Check any carpeting to make sure that it is firmly attached to the stairs. Fix any carpet that is loose or worn. Avoid having throw rugs at the top or bottom of the stairs. If you do have throw rugs, attach them to the floor with carpet tape. Make sure that you have a light switch at the top of the stairs and the bottom of the stairs. If you do not have them, ask someone to add them for  you. What else can I do to help prevent falls? Wear shoes that: Do not have high heels. Have rubber bottoms. Are comfortable and fit you well. Are closed at the toe. Do not wear sandals. If you use a stepladder: Make sure that it is fully opened. Do not climb a closed stepladder. Make sure that both sides of the stepladder are locked into place. Ask someone to hold it for you, if possible. Clearly mark and make sure that you can see: Any grab bars or handrails. First and last steps. Where the edge of each step is. Use tools that help you move around (mobility aids) if they are needed. These include: Canes. Walkers. Scooters. Crutches. Turn on the lights when you go into a dark area. Replace any light bulbs as soon as they burn out. Set up your furniture so you have a clear path. Avoid moving your furniture around. If any of your floors are uneven, fix them. If there are any pets around you, be aware of where they are. Review your medicines with your doctor. Some medicines can make you feel dizzy. This can increase your chance of falling. Ask your doctor what other things that you can do to help prevent falls. This information is not intended to replace advice given to you by your health care provider. Make sure you discuss any questions you have with your health care provider. Document Released: 02/27/2009 Document Revised: 10/09/2015 Document Reviewed: 06/07/2014 Elsevier Interactive Patient Education  2017 [employer].

## 2022-05-13 NOTE — Progress Notes (Addendum)
I connected with  Drema Pry on 05/13/22 by a audio enabled telemedicine application and verified that I am speaking with the correct person using two identifiers.  Patient Location: Home  Provider Location: Office/Clinic  I discussed the limitations of evaluation and management by telemedicine. The patient expressed understanding and agreed to proceed.   Subjective:   Henry [name] is a 76 y.o. male who presents for Medicare Annual/Subsequent preventive examination.  Review of Systems     Cardiac Risk Factors include: advanced age (>59mn, >>64women);hypertension;dyslipidemia;male gender     Objective:    Today's Vitals   05/13/22 1301  Weight: 228 lb (103.4 kg)   Body mass index is 29.27 kg/m.     05/13/2022    1:10 PM 12/25/2021   10:11 AM 06/26/2021    9:59 AM 05/12/2021    1:09 PM 11/11/2020   12:51 PM 11/11/2020    6:33 AM 11/03/2020    9:43 AM  Advanced Directives  Does Patient Have a Medical Advance Directive? Yes Yes Yes No Yes No No  Type of [place] Living will;Healthcare Power of Attorney Living will;Healthcare Power of Attorney  Living will;Healthcare Power of Attorney    Does patient want to make changes to medical advance directive?     No - Patient declined    Copy of [place] Chart? No - copy requested    No - copy requested    Would patient like information on creating a medical advance directive?    No - Patient declined No - Patient declined Yes (MAU/Ambulatory/Procedural Areas - Information given)     Current Medications (verified) Outpatient Encounter Medications as of 05/13/2022  Medication Sig   acetaminophen (TYLENOL) 325 MG tablet Take 1-2 tablets (325-650 mg total) by mouth every 6 (six) hours as needed for mild pain (pain score 1-3 or temp > 100.5).   amLODipine (NORVASC) 5 MG tablet TAKE 1 TABLET BY MOUTH DAILY   aspirin EC 81 MG tablet Take 4 tablets (325 mg total) by  mouth 2 (two) times daily.   bisacodyl (DULCOLAX) 5 MG EC tablet Take 1 tablet (5 mg total) by mouth daily as needed for moderate constipation.   cetirizine (ZYRTEC) 10 MG tablet Take 10 mg by mouth daily.   Cholecalciferol (VITAMIN D) 50 MCG (2000 UT) CAPS Take 2,000 Units by mouth daily.   clotrimazole-betamethasone (LOTRISONE) cream Apply 1 Application topically daily as needed (Rash).   docusate sodium (COLACE) 50 MG capsule Take 50 mg by mouth daily.   dorzolamide-timolol (COSOPT) 2-0.5 % ophthalmic solution SMARTSIG:In Eye(s)   latanoprost (XALATAN) 0.005 % ophthalmic solution SMARTSIG:In Eye(s)   lisinopril (ZESTRIL) 40 MG tablet TAKE 1 TABLET BY MOUTH DAILY   meloxicam (MOBIC) 15 MG tablet Take 15 mg by mouth daily.   omeprazole (PRILOSEC) 20 MG capsule TAKE 1 CAPSULE BY MOUTH DAILY   phenylephrine-shark liver oil-mineral oil-petrolatum (PREPARATION H) 0.25-14-74.9 % rectal ointment Place 1 application rectally 2 (two) times daily as needed for hemorrhoids.   TURMERIC PO Take by mouth.   ZINC GLUCONATE PO Take by mouth daily. Immune Health   [DISCONTINUED] brimonidine-timolol (COMBIGAN) 0.2-0.5 % ophthalmic solution    [DISCONTINUED] timolol (TIMOPTIC) 0.5 % ophthalmic solution SMARTSIG:In Eye(s)   No facility-administered encounter medications on file as of 05/13/2022.    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   Cataract    GERD (gastroesophageal reflux disease)    Hyperlipidemia    Hypertension    Pneumonia    Past Surgical History:  Procedure Laterality Date   CATARACT EXTRACTION Bilateral    COLONOSCOPY     MOLE REMOVAL     NECK MASS EXCISION     PORTACATH PLACEMENT N/A 12/29/2016   Procedure: INSERTION PORT-A-CATH;  Surgeon: Christene Lye, MD;  Location: [facility] ORS;   Service: General;  Laterality: N/A;   TONSILLECTOMY     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   Procedure: LEFT TOTAL KNEE ARTHROPLASTY;  Surgeon: Thornton Park, MD;  Location: [facility] ORS;  Service: Orthopedics;  Laterality: Left;   Family History  Problem Relation Age of Onset   Heart disease Mother    Rheumatic fever Mother    CAD Mother    Heart attack Father    COPD Sister    Social History   Socioeconomic History   Marital status: Married    Spouse name: Not on file   Number of children: 2   Years of education: Not on file   Highest education level: Some college, no degree  Occupational History   Occupation: retired  Tobacco Use   Smoking status: Never   Smokeless tobacco: Never  Vaping Use   Vaping Use: Never used  Substance and Sexual Activity   Alcohol use: Yes    Alcohol/week: 8.0 standard drinks of alcohol    Types: 8 Cans of beer per week    Comment: 2 beers four days a week   Drug use: No   Sexual activity: Not on file  Other Topics Concern   Not on file  Social History Narrative   Not on file   Social Determinants of Health   Financial Resource Strain: Low Risk  (05/13/2022)   Overall Financial Resource Strain (CARDIA)    Difficulty of Paying Living Expenses: Not hard at all  Food Insecurity: No Food Insecurity (05/13/2022)   Hunger Vital Sign    Worried About Running Out of Food in the Last Year: Never true    Ran Out of Food in the Last Year: Never true  Transportation Needs: No Transportation Needs (05/13/2022)   PRAPARE - Hydrologist (Medical): No    Lack of Transportation (Non-Medical): No  Physical Activity: Sufficiently Active (05/13/2022)   Exercise Vital Sign    Days of Exercise per Week: 7 days    Minutes of Exercise per Session: 30 min  Stress: No Stress Concern Present (05/13/2022)   [place]    Feeling of Stress : Not at all  Social  Connections: [place] (05/13/2022)   Social Connection and Isolation Panel [NHANES]    Frequency of Communication with Friends and Family: More than three times a week    Frequency of Social Gatherings with Friends and Family: More than three times a week    Attends Religious Services: More than 4 times per year    Active Member of [employer] or Organizations: Yes    Attends Archivist Meetings: 1 to 4 times per year    Marital Status: Married    Tobacco Counseling Counseling given: Not Answered   Clinical Intake:  Pre-visit preparation completed: Yes  Pain : No/denies pain     Nutritional Risks: None Diabetes: No  How often do you need to have  someone help you when you read instructions, pamphlets, or other written materials from your doctor or pharmacy?: 1 - Never  Diabetic?no  Interpreter Needed?: No  Information entered by :: Charlott Rakes, LPN   Activities of Daily Living    05/13/2022    1:11 PM 06/23/2021   11:55 AM  In your present state of health, do you have any difficulty performing the following activities:  Hearing? 0 1  Vision? 0 0  Difficulty concentrating or making decisions? 0 0  Walking or climbing stairs? 0 0  Dressing or bathing? 0 0  Doing errands, shopping? 0 0  Preparing Food and eating ? N   Using the Toilet? N   In the past six months, have you accidently leaked urine? N   Do you have problems with loss of bowel control? N   Managing your Medications? N   Managing your Finances? N   Housekeeping or managing your Housekeeping? N     Patient Care Team: Eulis Foster, MD as PCP - General (Family Medicine) Cammie Sickle, MD as Consulting Physician (Medical Oncology) Noreene Filbert, MD as Referring Physician (Radiation Oncology) Anell Barr, OD as Consulting Physician (Optometry)  Indicate any recent Medical Services you may have received from other than [facility] providers in the past year (date may  be approximate).     Assessment:   This is a routine wellness examination for [place].  Hearing/Vision screen Hearing Screening - Comments:: Pt has hearing aids  Vision Screening - Comments:: Pt follows up with Dr Ellin Mayhew for annul eye exams   Dietary issues and exercise activities discussed: Current Exercise Habits: Home exercise routine, Type of exercise: walking, Time (Minutes): 30, Frequency (Times/Week): 5, Weekly Exercise (Minutes/Week): 150   Goals Addressed             This Visit's Progress    Patient Stated       Walk as much as I can        Depression Screen    05/13/2022    1:09 PM 06/23/2021   11:54 AM 05/12/2021    1:08 PM 11/03/2020    8:41 AM 08/29/2019    9:39 AM 08/28/2018    9:31 AM 11/04/2017    9:55 AM  PHQ 2/9 Scores  PHQ - 2 Score 0 0 0 0 0 0 0  PHQ- 9 Score  0         Fall Risk    05/13/2022    1:11 PM 06/23/2021   11:54 AM 05/12/2021    1:10 PM 11/03/2020    8:41 AM 08/29/2019    9:42 AM  Fall Risk   Falls in the past year? 0 0  0 0  Number falls in past yr: 0 0 0 0 0  Injury with Fall? 0 0 0 0 0  Risk for fall due to : Impaired vision No Fall Risks No Fall Risks No Fall Risks   Follow up Falls prevention discussed Falls evaluation completed Falls evaluation completed Falls evaluation completed Falls prevention discussed    FALL RISK PREVENTION PERTAINING TO THE HOME:  Any stairs in or around the home? Yes  If so, are there any without handrails? Yes  Home free of loose throw rugs in walkways, pet beds, electrical cords, etc? Yes  Adequate lighting in your home to reduce risk of falls? Yes   ASSISTIVE DEVICES UTILIZED TO PREVENT FALLS:  Life alert? No  Use of a cane, walker or w/c? No  Grab  bars in the bathroom? Yes  Shower chair or bench in shower? Yes  Elevated toilet seat or a handicapped toilet? Yes   TIMED UP AND GO:  Was the test performed? No .   Cognitive Function:        05/13/2022    1:12 PM 08/03/2016   10:33 AM   6CIT Screen  What Year? 0 points 0 points  What month? 0 points 0 points  What time? 0 points 0 points  Count back from 20 0 points 0 points  Months in reverse 0 points 0 points  Repeat phrase 0 points 6 points  Total Score 0 points 6 points    Immunizations Immunization History  Administered Date(s) Administered   Fluad Quad(high Dose 65+) 02/14/2019, 03/20/2020, 03/10/2022   Influenza Split 04/01/2007, 02/20/2010, 03/25/2011, 03/30/2012   Influenza, High Dose Seasonal PF 02/19/2014, 03/05/2015, 03/18/2016, 02/21/2018   Influenza,inj,Quad PF,6+ Mos 02/21/2013, 03/09/2017   Moderna Sars-Covid-2 Vaccination 06/29/2019, 07/27/2019   Pneumococcal Conjugate-13 04/24/2013   Pneumococcal Polysaccharide-23 05/16/2014   Td 09/18/2018   Tdap 01/19/2005   Zoster Recombinat (Shingrix) 12/07/2017, 04/01/2018   Zoster, Live 03/30/2012    TDAP status: Up to date  Flu Vaccine status: Up to date  Pneumococcal vaccine status: Up to date  Covid-19 vaccine status: Completed vaccines  Qualifies for Shingles Vaccine? Yes   Zostavax completed Yes   Shingrix Completed?: Yes  Screening Tests Health Maintenance  Topic Date Due   COVID-19 Vaccine (3 - Moderna risk series) 08/24/2019   COLONOSCOPY (Pts 45-15yr Insurance coverage will need to be confirmed)  05/14/2023 (Originally 12/16/2020)   Medicare Annual Wellness (AWV)  05/14/2023   DTaP/Tdap/Td (3 - Td or Tdap) 09/17/2028   Pneumonia Vaccine 74 Years old  Completed   INFLUENZA VACCINE  Completed   Hepatitis C Screening  Completed   Zoster Vaccines- Shingrix  Completed   HPV VACCINES  Aged Out    Health Maintenance  Health Maintenance Due  Topic Date Due   COVID-19 Vaccine (3 - Moderna risk series) 08/24/2019    Colorectal cancer screening: Type of screening: Colonoscopy. Completed 12/17/10. Repeat every 10 years   Additional Screening:  Hepatitis C Screening:  Completed 04/03/12  Vision Screening: Recommended annual  ophthalmology exams for early detection of glaucoma and other disorders of the eye. Is the patient up to date with their annual eye exam?  Yes  Who is the provider or what is the name of the office in which the patient attends annual eye exams? Dr WEllin Mayhew If pt is not established with a provider, would they like to be referred to a provider to establish care? No .   Dental Screening: Recommended annual dental exams for proper oral hygiene  Community Resource Referral / Chronic Care Management: CRR required this visit?  No   CCM required this visit?  No      Plan:     I have personally reviewed and noted the following in the patient's chart:   Medical and social history Use of alcohol, tobacco or illicit drugs  Current medications and supplements including opioid prescriptions. Patient is not currently taking opioid prescriptions. Functional ability and status Nutritional status Physical activity Advanced directives List of other physicians Hospitalizations, surgeries, and ER visits in previous 12 months Vitals Screenings to include cognitive, depression, and falls Referrals and appointments  In addition, I have reviewed and discussed with patient certain preventive protocols, quality metrics, and best practice recommendations. A written personalized care plan for preventive services  as well as general preventive health recommendations were provided to patient.     Willette Brace, LPN   [id number]   Nurse Notes: none

## 2022-05-20 ENCOUNTER — Telehealth: Payer: Self-pay | Admitting: Family Medicine

## 2022-05-20 DIAGNOSIS — K219 Gastro-esophageal reflux disease without esophagitis: Secondary | ICD-10-CM

## 2022-05-20 DIAGNOSIS — I10 Essential (primary) hypertension: Secondary | ICD-10-CM

## 2022-05-20 MED ORDER — LISINOPRIL 40 MG PO TABS: 40.0000 mg | ORAL_TABLET | Freq: Every day | ORAL | 0 refills | Status: DC

## 2022-05-20 MED ORDER — AMLODIPINE BESYLATE 5 MG PO TABS: 5.0000 mg | ORAL_TABLET | Freq: Every day | ORAL | 0 refills | Status: DC

## 2022-05-20 MED ORDER — OMEPRAZOLE 20 MG PO CPDR: 20.0000 mg | DELAYED_RELEASE_CAPSULE | Freq: Every day | ORAL | 0 refills | Status: DC

## 2022-05-20 NOTE — Telephone Encounter (Signed)
[facility] faxed refill request for the following medications:   amLODipine (NORVASC) 5 MG tablet    omeprazole (PRILOSEC) 20 MG capsule     lisinopril (ZESTRIL) 40 MG tablet   Please advise

## 2022-06-08 NOTE — Progress Notes (Unsigned)
I,[name],acting as a scribe for [employer], MD.,have documented all relevant documentation on the behalf of Eulis Foster, MD,as directed by  Eulis Foster, MD while in the presence of Eulis Foster, MD.   Established patient visit   Patient: Henry [name]   DOB: 28-Dec-1945   77 y.o. Male  MRN: [id number] Visit Date: 06/09/2022  Today's healthcare provider: Eulis Foster, MD   Chief Complaint  Patient presents with   Cough   Subjective    Cough This is a new problem. Episode onset: 3 weeks. The problem has been gradually improving. The problem occurs constantly. The cough is Productive of sputum. Pertinent negatives include no ear congestion, ear pain, fever, headaches, nasal congestion, postnasal drip, rhinorrhea, shortness of breath or wheezing. Exacerbated by: mold at home. Treatments tried: honey whisky, nyquil.      Medications: Outpatient Medications Prior to Visit  Medication Sig   acetaminophen (TYLENOL) 325 MG tablet Take 1-2 tablets (325-650 mg total) by mouth every 6 (six) hours as needed for mild pain (pain score 1-3 or temp > 100.5).   amLODipine (NORVASC) 5 MG tablet Take 1 tablet (5 mg total) by mouth daily.   aspirin EC 81 MG tablet Take 4 tablets (325 mg total) by mouth 2 (two) times daily.   bisacodyl (DULCOLAX) 5 MG EC tablet Take 1 tablet (5 mg total) by mouth daily as needed for moderate constipation.   cetirizine (ZYRTEC) 10 MG tablet Take 10 mg by mouth daily.   Cholecalciferol (VITAMIN D) 50 MCG (2000 UT) CAPS Take 2,000 Units by mouth daily.   clotrimazole-betamethasone (LOTRISONE) cream Apply 1 Application topically daily as needed (Rash).   docusate sodium (COLACE) 50 MG capsule Take 50 mg by mouth daily.   dorzolamide-timolol (COSOPT) 2-0.5 % ophthalmic solution SMARTSIG:In Eye(s)   latanoprost (XALATAN) 0.005 % ophthalmic solution SMARTSIG:In Eye(s)   lisinopril (ZESTRIL) 40 MG  tablet Take 1 tablet (40 mg total) by mouth daily.   meloxicam (MOBIC) 15 MG tablet Take 15 mg by mouth daily.   omeprazole (PRILOSEC) 20 MG capsule Take 1 capsule (20 mg total) by mouth daily.   phenylephrine-shark liver oil-mineral oil-petrolatum (PREPARATION H) 0.25-14-74.9 % rectal ointment Place 1 application rectally 2 (two) times daily as needed for hemorrhoids.   ZINC GLUCONATE PO Take by mouth daily. Immune Health   TURMERIC PO Take by mouth.   No facility-administered medications prior to visit.    Review of Systems  Constitutional:  Negative for fever.  HENT:  Negative for ear pain, postnasal drip and rhinorrhea.   Respiratory:  Positive for cough. Negative for shortness of breath and wheezing.   Neurological:  Negative for headaches.    {Labs  Heme  Chem  Endocrine  Serology  Results Review (optional):23779}   Objective    BP 118/78 (BP Location: Left Arm, Patient Position: Sitting, Cuff Size: Large)   Pulse 80   Temp 98.9 F (37.2 C) (Oral)   Resp 16   Wt 227 lb 8 oz (103.2 kg)   SpO2 96%   BMI 29.21 kg/m  {Show previous vital signs (optional):23777}  Physical Exam Cardiovascular:     Rate and Rhythm: Normal rate and regular rhythm.     Pulses: Normal pulses.     Heart sounds: Normal heart sounds.  Pulmonary:     Effort: Pulmonary effort is normal. No tachypnea, bradypnea, accessory muscle usage, prolonged expiration, respiratory distress or retractions.     Breath sounds: Normal breath sounds.  No stridor, decreased air movement or transmitted upper airway sounds. No decreased breath sounds, wheezing, rhonchi or rales.      No results found for any visits on 06/09/22.  Assessment & Plan     Problem List Items Addressed This Visit       Other   Cough - Primary    Persistent cough for 3 weeks  Prescribed tessalon perles '200mg'$  TID PRN  Will send for CXR given productive cough in patient with no hx of smoking or lung disease        Relevant  Medications   benzonatate (TESSALON) 200 MG capsule   Other Relevant Orders   DG Chest 2 View     Return if symptoms worsen or fail to improve.        The entirety of the information documented in the History of Present Illness, Review of Systems and Physical Exam were personally obtained by me. Portions of this information were initially documented by Lyndel Pleasure, CMA and reviewed by me for thoroughness and accuracy.Eulis Foster, MD    Eulis Foster, MD  [facility] [phone] (phone) [phone] (fax)  [place]

## 2022-06-09 ENCOUNTER — Ambulatory Visit (INDEPENDENT_AMBULATORY_CARE_PROVIDER_SITE_OTHER): Payer: Medicare HMO | Admitting: Family Medicine

## 2022-06-09 ENCOUNTER — Encounter: Payer: Self-pay | Admitting: Family Medicine

## 2022-06-09 ENCOUNTER — Ambulatory Visit
Admission: RE | Admit: 2022-06-09 | Discharge: 2022-06-09 | Disposition: A | Discharge status: Home / Self Care | Payer: Medicare HMO | Attending: Family Medicine | Admitting: Family Medicine

## 2022-06-09 ENCOUNTER — Ambulatory Visit
Admission: RE | Admit: 2022-06-09 | Discharge: 2022-06-09 | Disposition: A | Discharge status: Home / Self Care | Payer: Medicare HMO | Source: Ambulatory Visit | Attending: Family Medicine | Admitting: Family Medicine

## 2022-06-09 VITALS — BP 118/78 | HR 80 | Temp 98.9°F | Resp 16 | Wt 227.5 lb

## 2022-06-09 DIAGNOSIS — R058 Other specified cough: Secondary | ICD-10-CM

## 2022-06-09 DIAGNOSIS — R059 Cough, unspecified: Secondary | ICD-10-CM | POA: Insufficient documentation

## 2022-06-09 MED ORDER — BENZONATATE 200 MG PO CAPS: 200.0000 mg | ORAL_CAPSULE | Freq: Two times a day (BID) | ORAL | 0 refills | Status: DC | PRN

## 2022-06-09 NOTE — Assessment & Plan Note (Signed)
Persistent cough for 3 weeks  Prescribed tessalon perles '200mg'$  TID PRN  Will send for CXR given productive cough in patient with no hx of smoking or lung disease

## 2022-06-09 NOTE — Patient Instructions (Addendum)
Please report to [facility] located at:  [address], [place]  You do not need an appointment to have xrays completed.   Our office will follow up with  results once available.   We will follow up with you once the results are available.

## 2022-06-11 ENCOUNTER — Telehealth: Payer: Self-pay | Admitting: Family Medicine

## 2022-06-11 ENCOUNTER — Encounter: Payer: Self-pay | Admitting: Family Medicine

## 2022-06-11 NOTE — Telephone Encounter (Signed)
Patient called in checking on Chest xray results dokne 01/24 and hasn't heard anything yet. Please call back.

## 2022-06-11 NOTE — Telephone Encounter (Signed)
CXR has not been formally read as of yet, however, viewed imaging myself  and does not appear to have any changes compared to prior cxr. Does not appear to have findings concerning for pneumonia at this time.   Henry Foster, MD  [facility]

## 2022-06-11 NOTE — Telephone Encounter (Signed)
Please Review and advise 

## 2022-06-15 NOTE — Telephone Encounter (Signed)
Patient viewed results via my chart

## 2022-06-16 DIAGNOSIS — H26493 Other secondary cataract, bilateral: Secondary | ICD-10-CM | POA: Diagnosis not present

## 2022-06-16 DIAGNOSIS — H401112 Primary open-angle glaucoma, right eye, moderate stage: Secondary | ICD-10-CM | POA: Diagnosis not present

## 2022-07-14 DIAGNOSIS — Z1159 Encounter for screening for other viral diseases: Secondary | ICD-10-CM | POA: Diagnosis not present

## 2022-08-16 ENCOUNTER — Other Ambulatory Visit: Payer: Self-pay | Admitting: Family Medicine

## 2022-08-16 DIAGNOSIS — I10 Essential (primary) hypertension: Secondary | ICD-10-CM

## 2022-08-16 DIAGNOSIS — K219 Gastro-esophageal reflux disease without esophagitis: Secondary | ICD-10-CM

## 2022-10-20 DIAGNOSIS — L218 Other seborrheic dermatitis: Secondary | ICD-10-CM | POA: Diagnosis not present

## 2022-10-20 DIAGNOSIS — L821 Other seborrheic keratosis: Secondary | ICD-10-CM | POA: Diagnosis not present

## 2022-10-20 DIAGNOSIS — I781 Nevus, non-neoplastic: Secondary | ICD-10-CM | POA: Diagnosis not present

## 2022-11-12 ENCOUNTER — Other Ambulatory Visit: Payer: Self-pay

## 2022-11-12 ENCOUNTER — Other Ambulatory Visit: Payer: Self-pay | Admitting: Family Medicine

## 2022-11-12 DIAGNOSIS — I10 Essential (primary) hypertension: Secondary | ICD-10-CM

## 2022-11-12 DIAGNOSIS — K219 Gastro-esophageal reflux disease without esophagitis: Secondary | ICD-10-CM

## 2022-11-15 ENCOUNTER — Other Ambulatory Visit: Payer: Self-pay

## 2022-12-11 ENCOUNTER — Other Ambulatory Visit: Payer: Self-pay | Admitting: Family Medicine

## 2022-12-11 DIAGNOSIS — I10 Essential (primary) hypertension: Secondary | ICD-10-CM

## 2022-12-11 DIAGNOSIS — K219 Gastro-esophageal reflux disease without esophagitis: Secondary | ICD-10-CM

## 2022-12-13 NOTE — Telephone Encounter (Signed)
Requested medication (s) are due for refill today:   Yes for all 3  Requested medication (s) are on the active medication list:   Yes for all 3  Future visit scheduled:   Yes tomorrow 7/30 with Dr. Roxan Hockey at 10:00   Last ordered: All 3 last ordered 11/15/2022 #30, 0 refills.     Returned because labs due and has an appt tomorrow.   Requested Prescriptions  Pending Prescriptions Disp Refills   omeprazole (PRILOSEC) 20 MG capsule [Pharmacy Med Name: OMEPRAZOLE DR 20 MG CAPSULE] 30 capsule 0    Sig: TAKE 1 CAPSULE BY MOUTH DAILY     Gastroenterology: Proton Pump Inhibitors Passed - 12/11/2022  6:51 AM      Passed - Valid encounter within last 12 months    Recent Outpatient Visits           6 months ago Other cough   [place] [facility] Simmons-Robinson, [place], MD   9 months ago Bilateral low back pain with right-sided sciatica, unspecified chronicity   [place] [facility] [place], [place], PA-C   1 year ago Annual physical exam   [place] [facility] Bosie Clos, MD   1 year ago Symptomatic lesion of skin   [place] [facility] Mecum, Oswaldo Conroy, PA-C   2 years ago Pre-op evaluation   [place] [facility] Bosie Clos, MD       Future Appointments             Tomorrow Simmons-Robinson, Tawanna Cooler, MD [place] [facility]             lisinopril (ZESTRIL) 40 MG tablet [Pharmacy Med Name: LISINOPRIL 40 MG TABLET] 30 tablet 0    Sig: TAKE 1 TABLET BY MOUTH DAILY     Cardiovascular:  ACE Inhibitors Failed - 12/11/2022  6:51 AM      Failed - Cr in normal range and within 180 days    Creatinine, Ser  Date Value Ref Range Status  12/25/2021 1.26 (H) 0.61 - 1.24 mg/dL Final         Failed - K in normal range and within 180 days    Potassium  Date Value Ref Range Status  12/25/2021 4.3 3.5 - 5.1 mmol/L Final         Failed - Valid encounter within  last 6 months    Recent Outpatient Visits           6 months ago Other cough   [place] [facility] [place], [place], MD   9 months ago Bilateral low back pain with right-sided sciatica, unspecified chronicity   [place] [facility] [place], [place], PA-C   1 year ago Annual physical exam   [place] [facility] Bosie Clos, MD   1 year ago Symptomatic lesion of skin   [place] [facility] Mecum, Oswaldo Conroy, PA-C   2 years ago Pre-op evaluation   [place] [facility] Bosie Clos, MD       Future Appointments             Tomorrow Ronnald Ramp, MD [facility], [facility]            Passed - Patient is not pregnant      Passed - Last BP in normal range    BP Readings from Last 1 Encounters:  06/09/22 118/78  amLODipine (NORVASC) 5 MG tablet [Pharmacy Med Name: AMLODIPINE BESYLATE 5 MG TAB] 30 tablet 0    Sig: TAKE 1 TABLET BY MOUTH DAILY     Cardiovascular: Calcium Channel Blockers 2 Failed - 12/11/2022  6:51 AM      Failed - Valid encounter within last 6 months    Recent Outpatient Visits           6 months ago Other cough   [place] [facility] Simmons-Robinson, [place], MD   9 months ago Bilateral low back pain with right-sided sciatica, unspecified chronicity   [place] [facility] [place], [place], PA-C   1 year ago Annual physical exam   [place] [facility] Bosie Clos, MD   1 year ago Symptomatic lesion of skin   [place] [facility] Mecum, Oswaldo Conroy, PA-C   2 years ago Pre-op evaluation   [place] [facility] Bosie Clos, MD       Future Appointments             Tomorrow Simmons-Robinson, Tawanna Cooler, MD [facility], [facility]            Passed - Last BP in normal range    BP Readings  from Last 1 Encounters:  06/09/22 118/78         Passed - Last Heart Rate in normal range    Pulse Readings from Last 1 Encounters:  06/09/22 80

## 2022-12-14 ENCOUNTER — Encounter: Payer: Medicare HMO | Admitting: Family Medicine

## 2022-12-14 DIAGNOSIS — Z1211 Encounter for screening for malignant neoplasm of colon: Secondary | ICD-10-CM | POA: Diagnosis not present

## 2022-12-14 DIAGNOSIS — I10 Essential (primary) hypertension: Secondary | ICD-10-CM | POA: Diagnosis not present

## 2022-12-14 DIAGNOSIS — Z0000 Encounter for general adult medical examination without abnormal findings: Secondary | ICD-10-CM | POA: Diagnosis not present

## 2022-12-14 DIAGNOSIS — C8334 Diffuse large B-cell lymphoma, lymph nodes of axilla and upper limb: Secondary | ICD-10-CM | POA: Diagnosis not present

## 2022-12-14 DIAGNOSIS — E7800 Pure hypercholesterolemia, unspecified: Secondary | ICD-10-CM | POA: Diagnosis not present

## 2022-12-14 DIAGNOSIS — E663 Overweight: Secondary | ICD-10-CM | POA: Diagnosis not present

## 2022-12-14 DIAGNOSIS — J309 Allergic rhinitis, unspecified: Secondary | ICD-10-CM | POA: Diagnosis not present

## 2022-12-14 DIAGNOSIS — K219 Gastro-esophageal reflux disease without esophagitis: Secondary | ICD-10-CM | POA: Diagnosis not present

## 2022-12-15 DIAGNOSIS — H26493 Other secondary cataract, bilateral: Secondary | ICD-10-CM | POA: Diagnosis not present

## 2022-12-15 DIAGNOSIS — H401131 Primary open-angle glaucoma, bilateral, mild stage: Secondary | ICD-10-CM | POA: Diagnosis not present

## 2022-12-27 ENCOUNTER — Other Ambulatory Visit: Payer: Medicare HMO

## 2022-12-27 ENCOUNTER — Ambulatory Visit: Payer: Medicare HMO | Admitting: Internal Medicine

## 2022-12-29 DIAGNOSIS — M5430 Sciatica, unspecified side: Secondary | ICD-10-CM | POA: Diagnosis not present

## 2022-12-29 DIAGNOSIS — Z882 Allergy status to sulfonamides status: Secondary | ICD-10-CM | POA: Diagnosis not present

## 2022-12-29 DIAGNOSIS — M1990 Unspecified osteoarthritis, unspecified site: Secondary | ICD-10-CM | POA: Diagnosis not present

## 2022-12-29 DIAGNOSIS — E785 Hyperlipidemia, unspecified: Secondary | ICD-10-CM | POA: Diagnosis not present

## 2022-12-29 DIAGNOSIS — Z791 Long term (current) use of non-steroidal anti-inflammatories (NSAID): Secondary | ICD-10-CM | POA: Diagnosis not present

## 2022-12-29 DIAGNOSIS — H409 Unspecified glaucoma: Secondary | ICD-10-CM | POA: Diagnosis not present

## 2022-12-29 DIAGNOSIS — Z008 Encounter for other general examination: Secondary | ICD-10-CM | POA: Diagnosis not present

## 2022-12-29 DIAGNOSIS — Z8249 Family history of ischemic heart disease and other diseases of the circulatory system: Secondary | ICD-10-CM | POA: Diagnosis not present

## 2022-12-29 DIAGNOSIS — R600 Localized edema: Secondary | ICD-10-CM | POA: Diagnosis not present

## 2022-12-29 DIAGNOSIS — I10 Essential (primary) hypertension: Secondary | ICD-10-CM | POA: Diagnosis not present

## 2022-12-29 DIAGNOSIS — Z8572 Personal history of non-Hodgkin lymphomas: Secondary | ICD-10-CM | POA: Diagnosis not present

## 2022-12-29 DIAGNOSIS — Z7982 Long term (current) use of aspirin: Secondary | ICD-10-CM | POA: Diagnosis not present

## 2022-12-29 DIAGNOSIS — Z833 Family history of diabetes mellitus: Secondary | ICD-10-CM | POA: Diagnosis not present

## 2022-12-30 ENCOUNTER — Inpatient Hospital Stay: Payer: Medicare HMO | Admitting: Internal Medicine

## 2022-12-30 ENCOUNTER — Inpatient Hospital Stay: Payer: Medicare HMO

## 2023-01-09 ENCOUNTER — Other Ambulatory Visit: Payer: Self-pay | Admitting: Family Medicine

## 2023-01-09 DIAGNOSIS — K219 Gastro-esophageal reflux disease without esophagitis: Secondary | ICD-10-CM

## 2023-01-09 DIAGNOSIS — I10 Essential (primary) hypertension: Secondary | ICD-10-CM

## 2023-01-19 DIAGNOSIS — R69 Illness, unspecified: Secondary | ICD-10-CM | POA: Diagnosis not present

## 2023-01-25 DIAGNOSIS — R69 Illness, unspecified: Secondary | ICD-10-CM | POA: Diagnosis not present

## 2023-02-09 DIAGNOSIS — R69 Illness, unspecified: Secondary | ICD-10-CM | POA: Diagnosis not present

## 2023-02-11 DIAGNOSIS — R69 Illness, unspecified: Secondary | ICD-10-CM | POA: Diagnosis not present

## 2023-04-26 DIAGNOSIS — K5900 Constipation, unspecified: Secondary | ICD-10-CM | POA: Diagnosis not present

## 2023-04-26 DIAGNOSIS — Z1211 Encounter for screening for malignant neoplasm of colon: Secondary | ICD-10-CM | POA: Diagnosis not present

## 2023-06-15 DIAGNOSIS — Z23 Encounter for immunization: Secondary | ICD-10-CM | POA: Diagnosis not present

## 2023-06-15 DIAGNOSIS — E7800 Pure hypercholesterolemia, unspecified: Secondary | ICD-10-CM | POA: Diagnosis not present

## 2023-06-15 DIAGNOSIS — C8334 Diffuse large B-cell lymphoma, lymph nodes of axilla and upper limb: Secondary | ICD-10-CM | POA: Diagnosis not present

## 2023-06-15 DIAGNOSIS — M545 Low back pain, unspecified: Secondary | ICD-10-CM | POA: Diagnosis not present

## 2023-06-15 DIAGNOSIS — G8929 Other chronic pain: Secondary | ICD-10-CM | POA: Diagnosis not present

## 2023-06-15 DIAGNOSIS — I10 Essential (primary) hypertension: Secondary | ICD-10-CM | POA: Diagnosis not present

## 2023-06-17 DIAGNOSIS — H26493 Other secondary cataract, bilateral: Secondary | ICD-10-CM | POA: Diagnosis not present

## 2023-06-17 DIAGNOSIS — H401132 Primary open-angle glaucoma, bilateral, moderate stage: Secondary | ICD-10-CM | POA: Diagnosis not present

## 2023-06-29 NOTE — H&P (Signed)
Pre-Procedure H&P   Patient ID: Henry [name] is a 78 y.o. male.  Gastroenterology Provider: [name], DO  Referring Provider: Tawni Pummel, PA PCP: Ronnald Ramp, MD  Date: 06/30/2023  HPI Henry [name] is a 78 y.o. male who presents today for Colonoscopy for Colorectal cancer screening   Patient reports occasional constipation having a bowel movement 3-4 times a week.  No melena or hematochezia.  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  Remote history of colonoscopy in [place] which was normal per the patient  Creatinine 1.3 hemoglobin 14.7 MCV 93 platelets 200,000   Past Medical History:  Diagnosis Date   Allergy    Arthritis    Cancer (HCC)    left axillary   Cataract    GERD (gastroesophageal reflux disease)    Hyperlipidemia    Hypertension    Pneumonia     Past Surgical History:  Procedure Laterality Date   CATARACT EXTRACTION Bilateral    COLONOSCOPY     MOLE REMOVAL     NECK MASS EXCISION     PORTACATH PLACEMENT N/A 12/29/2016   Procedure: INSERTION PORT-A-CATH;  Surgeon: Kieth Brightly, MD;  Location: [facility] ORS;  Service: General;  Laterality: N/A;   TONSILLECTOMY     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   Procedure: LEFT TOTAL KNEE ARTHROPLASTY;  Surgeon: Juanell Fairly, MD;  Location: [facility] ORS;  Service: Orthopedics;  Laterality: Left;    Family History No h/o GI disease or malignancy  Review of Systems  Constitutional:  Negative for activity change, appetite change, chills, diaphoresis, fatigue, fever and unexpected weight change.  HENT:  Negative for trouble swallowing and voice change.   Respiratory:  Negative for shortness of breath and wheezing.   Cardiovascular:  Negative for chest pain, palpitations and leg swelling.  Gastrointestinal:  Negative for abdominal distention, abdominal pain, anal bleeding, blood in stool,  constipation, diarrhea, nausea and vomiting.  Musculoskeletal:  Negative for arthralgias and myalgias.  Skin:  Negative for color change and pallor.  Neurological:  Negative for dizziness, syncope and weakness.  Psychiatric/Behavioral:  Negative for confusion. The patient is not nervous/anxious.   All other systems reviewed and are negative.    Medications No current facility-administered medications on file prior to encounter.   Current Outpatient Medications on File Prior to Encounter  Medication Sig Dispense Refill   amLODipine (NORVASC) 5 MG tablet Take 1 tablet (5 mg total) by mouth daily. Please schedule office visit before any future refills. 30 tablet 0   lisinopril (ZESTRIL) 40 MG tablet Take 1 tablet (40 mg total) by mouth daily. Please schedule office visit before any future refills. 30 tablet 0   meloxicam (MOBIC) 15 MG tablet Take 15 mg by mouth daily.     acetaminophen (TYLENOL) 325 MG tablet Take 1-2 tablets (325-650 mg total) by mouth every 6 (six) hours as needed for mild pain (pain score 1-3 or temp > 100.5). 60 tablet 1   aspirin EC 81 MG tablet Take 4 tablets (325 mg total) by mouth 2 (two) times daily. 90 tablet 11   benzonatate (TESSALON) 200 MG capsule Take 1 capsule (200 mg total) by mouth 2 (two) times daily as needed for cough. 20 capsule 0   bisacodyl (DULCOLAX) 5 MG EC tablet Take 1 tablet (5 mg total) by mouth daily as needed for moderate constipation. 30 tablet 0   cetirizine (ZYRTEC) 10  MG tablet Take 10 mg by mouth daily.     Cholecalciferol (VITAMIN D) 50 MCG (2000 UT) CAPS Take 2,000 Units by mouth daily.     clotrimazole-betamethasone (LOTRISONE) cream Apply 1 Application topically daily as needed (Rash). 30 g 0   docusate sodium (COLACE) 50 MG capsule Take 50 mg by mouth daily.     dorzolamide-timolol (COSOPT) 2-0.5 % ophthalmic solution SMARTSIG:In Eye(s)     latanoprost (XALATAN) 0.005 % ophthalmic solution SMARTSIG:In Eye(s)     omeprazole (PRILOSEC)  20 MG capsule TAKE 1 CAPSULE BY MOUTH DAILY 30 capsule 0   phenylephrine-shark liver oil-mineral oil-petrolatum (PREPARATION H) 0.25-14-74.9 % rectal ointment Place 1 application rectally 2 (two) times daily as needed for hemorrhoids.     TURMERIC PO Take by mouth. (Patient not taking: Reported on 06/30/2023)     ZINC GLUCONATE PO Take by mouth daily. Immune Health (Patient not taking: Reported on 06/30/2023)      Pertinent medications related to GI and procedure were reviewed by me with the patient prior to the procedure   Current Facility-Administered Medications:    0.9 %  sodium chloride infusion, , Intravenous, Continuous, [name], DO, Last Rate: 20 mL/hr at 06/30/23 1113, New Bag at 06/30/23 1113  sodium chloride 20 mL/hr at 06/30/23 1113       Allergies  Allergen Reactions   Codeine Other (See Comments)    Gi upset   Lovastatin Other (See Comments)    Joint pain   Pravastatin Nausea And Vomiting    joint ache   Pravastatin Sodium     joint ache   Simvastatin Other (See Comments)    muscle ache    Statins    Sulfa Antibiotics Rash and Itching   Allergies were reviewed by me prior to the procedure  Objective   Body mass index is 28.94 kg/m. Vitals:   06/30/23 1103  BP: (!) 138/91  Pulse: 70  Resp: 18  Temp: (!) 96.6 F (35.9 C)  TempSrc: Temporal  SpO2: 96%  Weight: 102.2 kg  Height: 6\' 2"  (1.88 m)     Physical Exam Vitals and nursing note reviewed.  Constitutional:      General: He is not in acute distress.    Appearance: Normal appearance. He is not ill-appearing, toxic-appearing or diaphoretic.  HENT:     Head: Normocephalic and atraumatic.     Nose: Nose normal.     Mouth/Throat:     Mouth: Mucous membranes are moist.     Pharynx: Oropharynx is clear.  Eyes:     General: No scleral icterus.    Extraocular Movements: Extraocular movements intact.  Cardiovascular:     Rate and Rhythm: Normal rate and regular rhythm.     Heart  sounds: Normal heart sounds. No murmur heard.    No friction rub. No gallop.  Pulmonary:     Effort: Pulmonary effort is normal. No respiratory distress.     Breath sounds: Normal breath sounds. No wheezing, rhonchi or rales.  Abdominal:     General: Bowel sounds are normal. There is no distension.     Palpations: Abdomen is soft.     Tenderness: There is no abdominal tenderness. There is no guarding or rebound.  Musculoskeletal:     Cervical back: Neck supple.     Right lower leg: No edema.     Left lower leg: No edema.  Skin:    General: Skin is warm and dry.     Coloration: Skin  is not jaundiced or pale.  Neurological:     General: No focal deficit present.     Mental Status: He is alert and oriented to person, place, and time. Mental status is at baseline.  Psychiatric:        Mood and Affect: Mood normal.        Behavior: Behavior normal.        Thought Content: Thought content normal.        Judgment: Judgment normal.      Assessment:  [name] is a 78 y.o. male  who presents today for Colonoscopy for Colorectal cancer screening .  Plan:  Colonoscopy with possible intervention today  Colonoscopy with possible biopsy, control of bleeding, polypectomy, and interventions as necessary has been discussed with the patient/patient representative. Informed consent was obtained from the patient/patient representative after explaining the indication, nature, and risks of the procedure including but not limited to death, bleeding, perforation, missed neoplasm/lesions, cardiorespiratory compromise, and reaction to medications. Opportunity for questions was given and appropriate answers were provided. Patient/patient representative has verbalized understanding is amenable to undergoing the procedure.   [name], DO  [facility]  Portions of the record may have been created with voice recognition software. Occasional wrong-word or  'sound-a-like' substitutions may have occurred due to the inherent limitations of voice recognition software.  Read the chart carefully and recognize, using context, where substitutions may have occurred.

## 2023-06-30 ENCOUNTER — Other Ambulatory Visit: Payer: Self-pay

## 2023-06-30 ENCOUNTER — Ambulatory Visit: Payer: Medicare HMO | Admitting: Anesthesiology

## 2023-06-30 ENCOUNTER — Encounter: Payer: Self-pay | Admitting: Gastroenterology

## 2023-06-30 ENCOUNTER — Ambulatory Visit
Admission: RE | Admit: 2023-06-30 | Discharge: 2023-06-30 | Disposition: A | Discharge status: Home / Self Care | Payer: Medicare HMO | Attending: Gastroenterology | Admitting: Gastroenterology

## 2023-06-30 ENCOUNTER — Encounter
Admission: RE | Disposition: A | Discharge status: Home / Self Care | Payer: Self-pay | Source: Home / Self Care | Attending: Gastroenterology

## 2023-06-30 DIAGNOSIS — Z1211 Encounter for screening for malignant neoplasm of colon: Secondary | ICD-10-CM | POA: Diagnosis not present

## 2023-06-30 DIAGNOSIS — Z8572 Personal history of non-Hodgkin lymphomas: Secondary | ICD-10-CM | POA: Insufficient documentation

## 2023-06-30 DIAGNOSIS — D123 Benign neoplasm of transverse colon: Secondary | ICD-10-CM | POA: Diagnosis not present

## 2023-06-30 DIAGNOSIS — I10 Essential (primary) hypertension: Secondary | ICD-10-CM | POA: Diagnosis not present

## 2023-06-30 DIAGNOSIS — A630 Anogenital (venereal) warts: Secondary | ICD-10-CM | POA: Diagnosis not present

## 2023-06-30 DIAGNOSIS — K589 Irritable bowel syndrome without diarrhea: Secondary | ICD-10-CM | POA: Insufficient documentation

## 2023-06-30 DIAGNOSIS — K219 Gastro-esophageal reflux disease without esophagitis: Secondary | ICD-10-CM | POA: Insufficient documentation

## 2023-06-30 DIAGNOSIS — K6289 Other specified diseases of anus and rectum: Secondary | ICD-10-CM | POA: Diagnosis not present

## 2023-06-30 DIAGNOSIS — K635 Polyp of colon: Secondary | ICD-10-CM | POA: Diagnosis not present

## 2023-06-30 HISTORY — PX: POLYPECTOMY: SHX5525

## 2023-06-30 HISTORY — PX: COLONOSCOPY WITH PROPOFOL: SHX5780

## 2023-06-30 SURGERY — COLONOSCOPY WITH PROPOFOL
Anesthesia: General

## 2023-06-30 MED ORDER — LIDOCAINE HCL (PF) 2 % IJ SOLN
INTRAMUSCULAR | Status: AC
  Filled 2023-06-30: qty 5

## 2023-06-30 MED ORDER — SODIUM CHLORIDE 0.9 % IV SOLN: INTRAVENOUS | Status: DC

## 2023-06-30 MED ORDER — PROPOFOL 500 MG/50ML IV EMUL
INTRAVENOUS | Status: DC | PRN
  Administered 2023-06-30: 25 mg via INTRAVENOUS
  Administered 2023-06-30: 75 ug/kg/min via INTRAVENOUS
  Administered 2023-06-30 (×2): 25 mg via INTRAVENOUS

## 2023-06-30 MED ORDER — PROPOFOL 10 MG/ML IV BOLUS
INTRAVENOUS | Status: AC
  Filled 2023-06-30: qty 20

## 2023-06-30 MED ORDER — LIDOCAINE HCL (CARDIAC) PF 100 MG/5ML IV SOSY
PREFILLED_SYRINGE | INTRAVENOUS | Status: DC | PRN
  Administered 2023-06-30: 60 mg via INTRAVENOUS

## 2023-06-30 NOTE — Interval H&P Note (Signed)
History and Physical Interval Note: Preprocedure H&P from 06/30/23  was reviewed and there was no interval change after seeing and examining the patient.  Written consent was obtained from the patient after discussion of risks, benefits, and alternatives. Patient has consented to proceed with Colonoscopy with possible intervention   06/30/2023 11:24 AM  [name]  has presented today for surgery, with the diagnosis of Colon Cancer Screening.  The various methods of treatment have been discussed with the patient and family. After consideration of risks, benefits and other options for treatment, the patient has consented to  Procedure(s): COLONOSCOPY WITH PROPOFOL (N/A) as a surgical intervention.  The patient's history has been reviewed, patient examined, no change in status, stable for surgery.  I have reviewed the patient's chart and labs.  Questions were answered to the patient's satisfaction.     [name]

## 2023-06-30 NOTE — Anesthesia Postprocedure Evaluation (Signed)
Anesthesia Post Note  Patient: Henry [name]  Procedure(s) Performed: COLONOSCOPY WITH PROPOFOL POLYPECTOMY  Patient location during evaluation: PACU Anesthesia Type: General Level of consciousness: awake and alert Pain management: pain level controlled Vital Signs Assessment: post-procedure vital signs reviewed and stable Respiratory status: spontaneous breathing, nonlabored ventilation and respiratory function stable Cardiovascular status: blood pressure returned to baseline and stable Postop Assessment: no apparent nausea or vomiting Anesthetic complications: no   No notable events documented.   Last Vitals:  Vitals:   06/30/23 1219 06/30/23 1232  BP: 120/80 129/82  Pulse: 68   Resp: 18 18  Temp:    SpO2: 99% 100%    Last Pain:  Vitals:   06/30/23 1232  TempSrc:   PainSc: 0-No pain                 Foye Deer

## 2023-06-30 NOTE — Op Note (Signed)
 [facility] Patient Name: Henry [name] Procedure Date: 06/30/2023 11:05 AM MRN: [id number] Account #: [id number] Date of Birth: 14-Nov-1945 Admit Type: Outpatient Age: 78 Room: [facility] ENDO ROOM 1 Gender: Male Note Status: Finalized Instrument Name: Colonoscope [id number] Procedure:             Colonoscopy Indications:           Screening for colorectal malignant neoplasm Providers:             Trenda Moots, DO Referring MD:          Nicki Guadalajara (Referring MD) Medicines:             Monitored Anesthesia Care Complications:         No immediate complications. Estimated blood loss:                         Minimal. Procedure:             Pre-Anesthesia Assessment:                        - Prior to the procedure, a History and Physical was                         performed, and patient medications and allergies were                         reviewed. The patient is competent. The risks and                         benefits of the procedure and the sedation options and                         risks were discussed with the patient. All questions                         were answered and informed consent was obtained.                         Patient identification and proposed procedure were                         verified by the physician, the nurse, the anesthetist                         and the technician in the endoscopy suite. Mental                         Status Examination: alert and oriented. Airway                         Examination: normal oropharyngeal airway and neck                         mobility. Respiratory Examination: clear to                         auscultation. CV Examination: RRR, no murmurs, no S3  or S4. Prophylactic Antibiotics: The patient does not                         require prophylactic antibiotics. Prior                         Anticoagulants: The patient has taken no anticoagulant                          or antiplatelet agents. ASA Grade Assessment: II - A                         patient with mild systemic disease. After reviewing                         the risks and benefits, the patient was deemed in                         satisfactory condition to undergo the procedure. The                         anesthesia plan was to use monitored anesthesia care                         (MAC). Immediately prior to administration of                         medications, the patient was re-assessed for adequacy                         to receive sedatives. The heart rate, respiratory                         rate, oxygen saturations, blood pressure, adequacy of                         pulmonary ventilation, and response to care were                         monitored throughout the procedure. The physical                         status of the patient was re-assessed after the                         procedure.                        After obtaining informed consent, the colonoscope was                         passed under direct vision. Throughout the procedure,                         the patient's blood pressure, pulse, and oxygen                         saturations were monitored continuously. The  Colonoscope was introduced through the anus and                         advanced to the the cecum, identified by appendiceal                         orifice and ileocecal valve. The colonoscopy was                         performed without difficulty. The patient tolerated                         the procedure well. The quality of the bowel                         preparation was evaluated using the BBPS [facility] Bowel                         Preparation Scale) with scores of: Right Colon = 3,                         Transverse Colon = 3 and Left Colon = 3 (entire mucosa                         seen well with no residual staining, small fragments                          of stool or opaque liquid). The total BBPS score                         equals 9. The ileocecal valve, appendiceal orifice,                         and rectum were photographed. Findings:      The perianal exam findings include anal condyloma; blood noted in anal       canal prior to procedure start.      The digital rectal exam was normal. Pertinent negatives include normal       sphincter tone.      A 1 to 2 mm polyp was found in the hepatic flexure. The polyp was       sessile. The polyp was removed with a jumbo cold forceps. Resection and       retrieval were complete. Estimated blood loss was minimal.      The exam was otherwise without abnormality on direct and retroflexion       views. 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                        - One 1 to 2 mm polyp at the hepatic flexure, removed                         with a jumbo cold forceps. Resected and  retrieved.                        - The examination was otherwise normal on direct and                         retroflexion views. Recommendation:        - Patient has a contact number available for                         emergencies. The signs and symptoms of potential                         delayed complications were discussed with the patient.                         Return to normal activities tomorrow. Written                         discharge instructions were provided to the patient.                        - Discharge patient to home.                        - Resume previous diet.                        - Continue present medications.                        - Await pathology results.                        - Repeat colonoscopy for surveillance based on                         pathology results.                        - Return to referring physician as previously                         scheduled.                         - Refer to a surgeon at appointment to be scheduled.                        - Surgeon will be able to assess and potentially                         address the anal lesion.                        - The findings and recommendations were discussed with                         the patient. Procedure Code(s):     --- Professional ---  16109, Colonoscopy, flexible; with biopsy, single or                         multiple Diagnosis Code(s):     --- Professional ---                        Z12.11, Encounter for screening for malignant neoplasm                         of colon                        D12.3, Benign neoplasm of transverse colon (hepatic                         flexure or splenic flexure) CPT copyright 2022 American Medical Association. All rights reserved. The codes documented in this report are preliminary and upon coder review may  be revised to meet current compliance requirements. Attending Participation:      I personally performed the entire procedure. Elfredia Nevins, DO [name] DO, DO 06/30/2023 12:01:39 PM This report has been signed electronically. Number of Addenda: 0 Note Initiated On: 06/30/2023 11:05 AM Scope Withdrawal Time: 0 hours 8 minutes 55 seconds  Total Procedure Duration: 0 hours 20 minutes 58 seconds  Estimated Blood Loss:  Estimated blood loss was minimal.      [facility]

## 2023-06-30 NOTE — Transfer of Care (Signed)
Immediate Anesthesia Transfer of Care Note  Patient: Henry [name]  Procedure(s) Performed: COLONOSCOPY WITH PROPOFOL POLYPECTOMY  Patient Location: PACU and Endoscopy Unit  Anesthesia Type:General  Level of Consciousness: sedated  Airway & Oxygen Therapy: Patient Spontanous Breathing  Post-op Assessment: Report given to RN and Post -op Vital signs reviewed and stable  Post vital signs: Reviewed and stable  Last Vitals:  Vitals Value Taken Time  BP 85/62 06/30/23 1200  Temp 36.1 C 06/30/23 1159  Pulse 61 06/30/23 1202  Resp 18 06/30/23 1159  SpO2 96 % 06/30/23 1202  Vitals shown include unfiled device data.  Last Pain:  Vitals:   06/30/23 1159  TempSrc: Temporal  PainSc: Asleep         Complications: No notable events documented.

## 2023-06-30 NOTE — Anesthesia Preprocedure Evaluation (Signed)
Anesthesia Evaluation  Patient identified by MRN, date of birth, ID band Patient awake    Reviewed: Allergy & Precautions, H&P , NPO status , Patient's Chart, lab work & pertinent test results  Airway Mallampati: II  TM Distance: >3 FB Neck ROM: full    Dental no notable dental hx.    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   Anesthesia Other Findings Past Medical History: No date: Allergy No date: Arthritis No date: Cancer [facility])     Comment:  left axillary No date: Cataract No date: GERD (gastroesophageal reflux disease) No date: Hyperlipidemia No date: Hypertension No date: Pneumonia  Past Surgical History: No date: CATARACT EXTRACTION; Bilateral No date: COLONOSCOPY No date: MOLE REMOVAL No date: NECK MASS EXCISION 12/29/2016: PORTACATH PLACEMENT; N/A     Comment:  Procedure: INSERTION PORT-A-CATH;  Surgeon: Kieth Brightly, MD;  Location: [facility] ORS;  Service:               General;  Laterality: N/A; No date: TONSILLECTOMY 11/11/2020: TOTAL KNEE ARTHROPLASTY; Left     Comment:  Procedure: LEFT TOTAL KNEE ARTHROPLASTY;  Surgeon:               Juanell Fairly, MD;  Location: [facility] ORS;  Service:               Orthopedics;  Laterality: Left;     Reproductive/Obstetrics negative OB ROS                              Anesthesia Physical Anesthesia Plan  ASA: 2  Anesthesia Plan: General   Post-op Pain Management:    Induction:   PONV Risk Score and Plan:  Propofol infusion and TIVA  Airway Management Planned:   Additional Equipment:   Intra-op Plan:   Post-operative Plan:   Informed Consent:      Dental Advisory Given  Plan Discussed with: CRNA and Surgeon  Anesthesia Plan Comments:          Anesthesia Quick Evaluation

## 2023-07-01 ENCOUNTER — Encounter: Payer: Self-pay | Admitting: Gastroenterology

## 2023-07-01 LAB — SURGICAL PATHOLOGY

## 2023-07-07 DIAGNOSIS — Z833 Family history of diabetes mellitus: Secondary | ICD-10-CM | POA: Diagnosis not present

## 2023-07-07 DIAGNOSIS — K219 Gastro-esophageal reflux disease without esophagitis: Secondary | ICD-10-CM | POA: Diagnosis not present

## 2023-07-07 DIAGNOSIS — Z7982 Long term (current) use of aspirin: Secondary | ICD-10-CM | POA: Diagnosis not present

## 2023-07-07 DIAGNOSIS — M1990 Unspecified osteoarthritis, unspecified site: Secondary | ICD-10-CM | POA: Diagnosis not present

## 2023-07-07 DIAGNOSIS — Z8572 Personal history of non-Hodgkin lymphomas: Secondary | ICD-10-CM | POA: Diagnosis not present

## 2023-07-07 DIAGNOSIS — I10 Essential (primary) hypertension: Secondary | ICD-10-CM | POA: Diagnosis not present

## 2023-07-07 DIAGNOSIS — Z8249 Family history of ischemic heart disease and other diseases of the circulatory system: Secondary | ICD-10-CM | POA: Diagnosis not present

## 2023-07-07 DIAGNOSIS — L309 Dermatitis, unspecified: Secondary | ICD-10-CM | POA: Diagnosis not present

## 2023-07-07 DIAGNOSIS — H409 Unspecified glaucoma: Secondary | ICD-10-CM | POA: Diagnosis not present

## 2023-07-07 DIAGNOSIS — M545 Low back pain, unspecified: Secondary | ICD-10-CM | POA: Diagnosis not present

## 2023-07-07 DIAGNOSIS — E785 Hyperlipidemia, unspecified: Secondary | ICD-10-CM | POA: Diagnosis not present

## 2023-07-07 DIAGNOSIS — J309 Allergic rhinitis, unspecified: Secondary | ICD-10-CM | POA: Diagnosis not present

## 2023-08-09 ENCOUNTER — Ambulatory Visit: Payer: Self-pay | Admitting: Surgery

## 2023-08-09 DIAGNOSIS — K629 Disease of anus and rectum, unspecified: Secondary | ICD-10-CM | POA: Diagnosis not present

## 2023-08-09 NOTE — H&P (Signed)
 Subjective:   CC: Anal lesion [K62.9]  HPI:  Henry [name] is a 78 y.o. male who was referred by Timothy Lasso for evaluation of above. First noted during recent colonoscopy.   Past Medical History:  has a past medical history of Hypertension.  Past Surgical History:  has a past surgical history that includes CORN REMOVED; MOLE REMOVED; KNOT REMOVAL OFF NECK; and Colon @ [facility] (06/30/2023).  Family History: family history includes ALS in his father; Heart disease in his father and mother; Rheumatic fever in his mother.  Social History:  reports that he has never smoked. He has never used smokeless tobacco. He reports current alcohol use. He reports that he does not use drugs.  Current Medications: has a current medication list which includes the following prescription(s): amlodipine, aspirin, cetirizine, glucosamine hcl, latanoprost, lisinopril, meloxicam, omeprazole, rosuvastatin, sodium, potassium, and magnesium, timolol maleate, and vitamin d3-vitamin k2 (mk4).  Allergies:  Allergies  Allergen Reactions   Codeine Other (See Comments)    Gi upset   Lovastatin Unknown   Pravastatin Unknown    joint ache   Simvastatin Unknown    muscle ache   Sulfa (Sulfonamide Antibiotics) Itching    ROS:  A 15 point review of systems was performed and pertinent positives and negatives noted in HPI   Objective:     BP (!) 147/81   Pulse 68   Ht 188 cm (6\' 2" )   Wt (!) 102.1 kg (225 lb)   BMI 28.89 kg/m   Constitutional :  No distress, cooperative, alert  Lymphatics/Throat:  Supple with no lymphadenopathy  Respiratory:  Clear to auscultation bilaterally  Cardiovascular:  Regular rate and rhythm  Gastrointestinal: Soft, non-tender, non-distended, no organomegaly.  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  Will proceed  with removal to confirm and also formal EUA for reassessment of any additional lesions.  Plan:     1. Anal lesion [K62.9] Discussed surgical excision.  Alternatives include continued observation.  Benefits include possible symptom relief, pathologic evaluation, improved cosmesis. Discussed the risk of surgery including recurrence, chronic pain, post-op infxn, poor cosmesis, poor/delayed wound healing, and possible re-operation to address said risks. The risks of general anesthetic, if used, includes MI, CVA, sudden death or even reaction to anesthetic medications also discussed.  Typical post-op recovery time of 3-5 days with possible activity restrictions were also discussed.  The patient verbalized understanding and all questions were answered to the patient's satisfaction.  2. Patient has elected to proceed with surgical treatment. Procedure will be scheduled.  labs/images/medications/previous chart entries reviewed personally and relevant changes/updates noted above.

## 2023-09-01 ENCOUNTER — Other Ambulatory Visit: Payer: Self-pay

## 2023-09-01 ENCOUNTER — Encounter
Admission: RE | Admit: 2023-09-01 | Discharge: 2023-09-01 | Disposition: A | Discharge status: Home / Self Care | Source: Ambulatory Visit | Attending: Surgery | Admitting: Surgery

## 2023-09-01 DIAGNOSIS — I10 Essential (primary) hypertension: Secondary | ICD-10-CM

## 2023-09-01 HISTORY — DX: Disease of anus and rectum, unspecified: K62.9

## 2023-09-01 NOTE — Patient Instructions (Addendum)
 Your procedure is scheduled on: 09/09/23 - Friday Report to the Registration Desk on the 1st floor of the [facility]. To find out your arrival time, please call [phone] between 1PM - 3PM on: 09/08/23 - Thursday If your arrival time is 6:00 am, do not arrive before that time as the [facility] entrance doors do not open until 6:00 am.  REMEMBER: Instructions that are not followed completely may result in serious medical risk, up to and including death; or upon the discretion of your surgeon and anesthesiologist your surgery may need to be rescheduled.  Do not eat food after midnight the night before surgery.  No gum chewing or hard candies.  You may however, drink CLEAR liquids up to 2 hours before you are scheduled to arrive for your surgery. Do not drink anything within 2 hours of your scheduled arrival time.  Clear liquids include: - water  - apple juice without pulp - gatorade (not RED colors) - black coffee or tea (Do NOT add milk or creamers to the coffee or tea) Do NOT drink anything that is not on this list.  One week prior to surgery: Stop Anti-inflammatories (NSAIDS) such as meloxicam (MOBIC) , Advil, Aleve, Ibuprofen, Motrin, Naproxen, Naprosyn and Aspirin based products such as Excedrin, Goody's Powder, BC Powder. You may take Tylenol if needed for pain up until the day of surgery.  Stop ANY OVER THE COUNTER supplements until after surgery : Multivitamin, Vitamin D  HOLD aspirin 7 days prior to surgery.   ON THE DAY OF SURGERY ONLY TAKE THESE MEDICATIONS WITH SIPS OF WATER:  amLODipine (NORVASC)  dorzolamide-timolol (COSOPT)  latanoprost (XALATAN) omeprazole (PRILOSEC)    No Alcohol for 24 hours before or after surgery.  No Smoking including e-cigarettes for 24 hours before surgery.  No chewable tobacco products for at least 6 hours before surgery.  No nicotine patches on the day of surgery.  Do not use any "recreational" drugs for at least a week  (preferably 2 weeks) before your surgery.  Please be advised that the combination of cocaine and anesthesia may have negative outcomes, up to and including death. If you test positive for cocaine, your surgery will be cancelled.  On the morning of surgery brush your teeth with toothpaste and water, you may rinse your mouth with mouthwash if you wish. Do not swallow any toothpaste or mouthwash.  Do not wear jewelry, make-up, hairpins, clips or nail polish.  For welded (permanent) jewelry: bracelets, anklets, waist bands, etc.  Please have this removed prior to surgery.  If it is not removed, there is a chance that hospital personnel will need to cut it off on the day of surgery.  Do not wear lotions, powders, or perfumes.   Do not shave body hair from the neck down 48 hours before surgery.  Contact lenses, hearing aids and dentures may not be worn into surgery.  Do not bring valuables to the hospital. [facility] is not responsible for any missing/lost belongings or valuables.   Notify your doctor if there is any change in your medical condition (cold, fever, infection).  Wear comfortable clothing (specific to your surgery type) to the hospital.  After surgery, you can help prevent lung complications by doing breathing exercises.  Take deep breaths and cough every 1-2 hours. Your doctor may order a device called an Incentive Spirometer to help you take deep breaths. When coughing or sneezing, hold a pillow firmly against your incision with both hands. This is called "  splinting." Doing this helps protect your incision. It also decreases belly discomfort.  If you are being admitted to the hospital overnight, leave your suitcase in the car. After surgery it may be brought to your room.  In case of increased patient census, it may be necessary for you, the patient, to continue your postoperative care in the Same Day Surgery department.  If you are being discharged the day of surgery, you  will not be allowed to drive home. You will need a responsible individual to drive you home and stay with you for 24 hours after surgery.   If you are taking public transportation, you will need to have a responsible individual with you.  Please call the Pre-admissions Testing Dept. at [phone] if you have any questions about these instructions.  Surgery Visitation Policy:  Patients having surgery or a procedure may have two visitors.  Children under the age of 65 must have an adult with them who is not the patient.  Inpatient Visitation:    Visiting hours are 7 a.m. to 8 p.m. Up to four visitors are allowed at one time in a patient room. The visitors may rotate out with other people during the day.  One visitor age 63 or older may stay with the patient overnight and must be in the room by 8 p.m.

## 2023-09-06 ENCOUNTER — Encounter
Admission: RE | Admit: 2023-09-06 | Discharge: 2023-09-06 | Disposition: A | Discharge status: Home / Self Care | Source: Ambulatory Visit | Attending: Surgery | Admitting: Surgery

## 2023-09-06 DIAGNOSIS — Z01812 Encounter for preprocedural laboratory examination: Secondary | ICD-10-CM | POA: Diagnosis not present

## 2023-09-06 DIAGNOSIS — I10 Essential (primary) hypertension: Secondary | ICD-10-CM | POA: Insufficient documentation

## 2023-09-06 DIAGNOSIS — Z01818 Encounter for other preprocedural examination: Secondary | ICD-10-CM | POA: Diagnosis not present

## 2023-09-06 DIAGNOSIS — R9431 Abnormal electrocardiogram [ECG] [EKG]: Secondary | ICD-10-CM | POA: Insufficient documentation

## 2023-09-06 DIAGNOSIS — Z01810 Encounter for preprocedural cardiovascular examination: Secondary | ICD-10-CM | POA: Diagnosis not present

## 2023-09-06 LAB — CBC
HCT: 42.2 % (ref 39.0–52.0)
Hemoglobin: 14.5 g/dL (ref 13.0–17.0)
MCH: 31.9 pg (ref 26.0–34.0)
MCHC: 34.4 g/dL (ref 30.0–36.0)
MCV: 92.7 fL (ref 80.0–100.0)
Platelets: 215 10*3/uL (ref 150–400)
RBC: 4.55 MIL/uL (ref 4.22–5.81)
RDW: 13 % (ref 11.5–15.5)
WBC: 5.9 10*3/uL (ref 4.0–10.5)
nRBC: 0 % (ref 0.0–0.2)

## 2023-09-09 ENCOUNTER — Ambulatory Visit
Admission: RE | Admit: 2023-09-09 | Discharge: 2023-09-09 | Disposition: A | Discharge status: Home / Self Care | Source: Ambulatory Visit | Attending: Surgery | Admitting: Surgery

## 2023-09-09 ENCOUNTER — Ambulatory Visit: Admitting: Anesthesiology

## 2023-09-09 ENCOUNTER — Encounter
Admission: RE | Disposition: A | Discharge status: Home / Self Care | Payer: Self-pay | Source: Ambulatory Visit | Attending: Surgery

## 2023-09-09 ENCOUNTER — Encounter: Payer: Self-pay | Admitting: Surgery

## 2023-09-09 ENCOUNTER — Other Ambulatory Visit: Payer: Self-pay

## 2023-09-09 DIAGNOSIS — K6282 Dysplasia of anus: Secondary | ICD-10-CM | POA: Diagnosis not present

## 2023-09-09 DIAGNOSIS — K219 Gastro-esophageal reflux disease without esophagitis: Secondary | ICD-10-CM | POA: Diagnosis not present

## 2023-09-09 DIAGNOSIS — I10 Essential (primary) hypertension: Secondary | ICD-10-CM | POA: Insufficient documentation

## 2023-09-09 DIAGNOSIS — K649 Unspecified hemorrhoids: Secondary | ICD-10-CM | POA: Diagnosis not present

## 2023-09-09 DIAGNOSIS — K629 Disease of anus and rectum, unspecified: Secondary | ICD-10-CM | POA: Diagnosis not present

## 2023-09-09 DIAGNOSIS — K642 Third degree hemorrhoids: Secondary | ICD-10-CM | POA: Diagnosis not present

## 2023-09-09 HISTORY — PX: RECTAL EXAM UNDER ANESTHESIA: SHX6399

## 2023-09-09 SURGERY — EXAM UNDER ANESTHESIA, RECTUM
Anesthesia: General | Site: Rectum

## 2023-09-09 MED ORDER — EPHEDRINE SULFATE-NACL 50-0.9 MG/10ML-% IV SOSY
PREFILLED_SYRINGE | INTRAVENOUS | Status: DC | PRN
  Administered 2023-09-09 (×2): 5 mg via INTRAVENOUS

## 2023-09-09 MED ORDER — FENTANYL CITRATE (PF) 100 MCG/2ML IJ SOLN
INTRAMUSCULAR | Status: AC
  Filled 2023-09-09: qty 2

## 2023-09-09 MED ORDER — BUPIVACAINE LIPOSOME 1.3 % IJ SUSP
INTRAMUSCULAR | Status: DC | PRN
  Administered 2023-09-09: 20 mL

## 2023-09-09 MED ORDER — CHLORHEXIDINE GLUCONATE 0.12 % MT SOLN
15.0000 mL | Freq: Once | OROMUCOSAL | Status: AC
  Administered 2023-09-09: 15 mL via OROMUCOSAL

## 2023-09-09 MED ORDER — CEFAZOLIN SODIUM-DEXTROSE 2-4 GM/100ML-% IV SOLN
2.0000 g | INTRAVENOUS | Status: AC
  Administered 2023-09-09: 2 g via INTRAVENOUS

## 2023-09-09 MED ORDER — FENTANYL CITRATE (PF) 100 MCG/2ML IJ SOLN
25.0000 ug | INTRAMUSCULAR | Status: DC | PRN
  Administered 2023-09-09: 25 ug via INTRAVENOUS

## 2023-09-09 MED ORDER — LIDOCAINE 5 % EX OINT: 1.0000 | TOPICAL_OINTMENT | Freq: Three times a day (TID) | CUTANEOUS | 0 refills | Status: AC | PRN

## 2023-09-09 MED ORDER — ONDANSETRON HCL 4 MG/2ML IJ SOLN
INTRAMUSCULAR | Status: DC | PRN
  Administered 2023-09-09: 4 mg via INTRAVENOUS

## 2023-09-09 MED ORDER — ACETAMINOPHEN 500 MG PO TABS
ORAL_TABLET | ORAL | Status: AC
  Filled 2023-09-09: qty 2

## 2023-09-09 MED ORDER — CEFAZOLIN SODIUM-DEXTROSE 2-4 GM/100ML-% IV SOLN
INTRAVENOUS | Status: AC
  Filled 2023-09-09: qty 100

## 2023-09-09 MED ORDER — ACETAMINOPHEN 325 MG PO TABS: 650.0000 mg | ORAL_TABLET | Freq: Three times a day (TID) | ORAL | 0 refills | Status: AC | PRN

## 2023-09-09 MED ORDER — BUPIVACAINE LIPOSOME 1.3 % IJ SUSP
INTRAMUSCULAR | Status: AC
  Filled 2023-09-09: qty 20

## 2023-09-09 MED ORDER — CHLORHEXIDINE GLUCONATE 0.12 % MT SOLN
OROMUCOSAL | Status: AC
  Filled 2023-09-09: qty 15

## 2023-09-09 MED ORDER — GELATIN ABSORBABLE 100 CM EX MISC
CUTANEOUS | Status: AC
  Filled 2023-09-09: qty 1

## 2023-09-09 MED ORDER — DROPERIDOL 2.5 MG/ML IJ SOLN: 0.6250 mg | Freq: Once | INTRAMUSCULAR | Status: DC | PRN

## 2023-09-09 MED ORDER — BUPIVACAINE-EPINEPHRINE (PF) 0.5% -1:200000 IJ SOLN
INTRAMUSCULAR | Status: AC
  Filled 2023-09-09: qty 20

## 2023-09-09 MED ORDER — OXYCODONE-ACETAMINOPHEN 5-325 MG PO TABS: 1.0000 | ORAL_TABLET | Freq: Three times a day (TID) | ORAL | 0 refills | Status: AC | PRN

## 2023-09-09 MED ORDER — BUPIVACAINE-EPINEPHRINE 0.5% -1:200000 IJ SOLN
INTRAMUSCULAR | Status: DC | PRN
  Administered 2023-09-09: 10 mL

## 2023-09-09 MED ORDER — ACETAMINOPHEN 500 MG PO TABS
1000.0000 mg | ORAL_TABLET | ORAL | Status: AC
  Administered 2023-09-09: 1000 mg via ORAL

## 2023-09-09 MED ORDER — ACETAMINOPHEN 10 MG/ML IV SOLN: 1000.0000 mg | Freq: Once | INTRAVENOUS | Status: DC | PRN

## 2023-09-09 MED ORDER — GELATIN ABSORBABLE 100 CM EX MISC
CUTANEOUS | Status: DC | PRN
  Administered 2023-09-09: 1

## 2023-09-09 MED ORDER — ORAL CARE MOUTH RINSE: 15.0000 mL | Freq: Once | OROMUCOSAL | Status: AC

## 2023-09-09 MED ORDER — CHLORHEXIDINE GLUCONATE CLOTH 2 % EX PADS: 6.0000 | MEDICATED_PAD | Freq: Once | CUTANEOUS | Status: DC

## 2023-09-09 MED ORDER — PROPOFOL 500 MG/50ML IV EMUL
INTRAVENOUS | Status: DC | PRN
  Administered 2023-09-09: 150 ug/kg/min via INTRAVENOUS

## 2023-09-09 MED ORDER — DOCUSATE SODIUM 100 MG PO CAPS
100.0000 mg | ORAL_CAPSULE | Freq: Two times a day (BID) | ORAL | 0 refills | Status: AC | PRN
Start: 2023-09-09 — End: 2023-09-19

## 2023-09-09 MED ORDER — LACTATED RINGERS IV SOLN: INTRAVENOUS | Status: DC

## 2023-09-09 MED ORDER — OXYCODONE HCL 5 MG PO TABS
ORAL_TABLET | ORAL | Status: AC
Start: 2023-09-09 — End: ?
  Filled 2023-09-09: qty 1

## 2023-09-09 MED ORDER — FENTANYL CITRATE (PF) 100 MCG/2ML IJ SOLN
INTRAMUSCULAR | Status: DC | PRN
  Administered 2023-09-09 (×2): 50 ug via INTRAVENOUS

## 2023-09-09 MED ORDER — OXYCODONE HCL 5 MG/5ML PO SOLN: 5.0000 mg | Freq: Once | ORAL | Status: AC | PRN

## 2023-09-09 MED ORDER — DEXMEDETOMIDINE HCL IN NACL 80 MCG/20ML IV SOLN
INTRAVENOUS | Status: DC | PRN
Start: 2023-09-09 — End: 2023-09-09
  Administered 2023-09-09: 8 ug via INTRAVENOUS
  Administered 2023-09-09: 4 ug via INTRAVENOUS
  Administered 2023-09-09: 8 ug via INTRAVENOUS

## 2023-09-09 MED ORDER — OXYCODONE HCL 5 MG PO TABS
5.0000 mg | ORAL_TABLET | Freq: Once | ORAL | Status: AC | PRN
  Administered 2023-09-09: 5 mg via ORAL

## 2023-09-09 SURGICAL SUPPLY — 28 items
BLADE SURG 15 STRL LF DISP TIS (BLADE) ×1 IMPLANT
BRIEF MESH DISP 2XL (UNDERPADS AND DIAPERS) ×1 IMPLANT
DRAPE PERI LITHO V/GYN (MISCELLANEOUS) ×1 IMPLANT
DRAPE UNDER BUTTOCK W/FLU (DRAPES) ×1 IMPLANT
DRSG GAUZE FLUFF 36X18 (GAUZE/BANDAGES/DRESSINGS) ×1 IMPLANT
ELECTRODE REM PT RTRN 9FT ADLT (ELECTROSURGICAL) ×1 IMPLANT
GAUZE 4X4 16PLY ~~LOC~~+RFID DBL (SPONGE) IMPLANT
GLOVE BIOGEL PI IND STRL 7.0 (GLOVE) ×1 IMPLANT
GLOVE SURG SYN 6.5 ES PF (GLOVE) ×1 IMPLANT
GLOVE SURG SYN 6.5 PF PI (GLOVE) ×1 IMPLANT
GOWN STRL REUS W/ TWL LRG LVL3 (GOWN DISPOSABLE) ×2 IMPLANT
KIT TURNOVER CYSTO (KITS) ×1 IMPLANT
LABEL OR SOLS (LABEL) ×1 IMPLANT
MANIFOLD NEPTUNE II (INSTRUMENTS) ×1 IMPLANT
NDL HYPO 22X1.5 SAFETY MO (MISCELLANEOUS) ×2 IMPLANT
NEEDLE HYPO 22X1.5 SAFETY MO (MISCELLANEOUS) ×2 IMPLANT
NS IRRIG 500ML POUR BTL (IV SOLUTION) ×1 IMPLANT
PACK BASIN MINOR ARMC (MISCELLANEOUS) ×1 IMPLANT
PAD PREP OB/GYN DISP 24X41 (PERSONAL CARE ITEMS) ×1 IMPLANT
SHEARS HARMONIC 9CM CVD (BLADE) IMPLANT
SOLUTION PREP PVP 2OZ (MISCELLANEOUS) ×1 IMPLANT
SURGILUBE 2OZ TUBE FLIPTOP (MISCELLANEOUS) ×1 IMPLANT
SUT VIC AB 3-0 SH 27X BRD (SUTURE) IMPLANT
SYR 10ML LL (SYRINGE) ×1 IMPLANT
SYR 20ML LL LF (SYRINGE) ×1 IMPLANT
TRAP FLUID SMOKE EVACUATOR (MISCELLANEOUS) ×1 IMPLANT
VACUUM HOSE 7/8X10 W/ WAND (MISCELLANEOUS) ×1 IMPLANT
WATER STERILE IRR 500ML POUR (IV SOLUTION) ×1 IMPLANT

## 2023-09-09 NOTE — Op Note (Signed)
 Preoperative diagnosis: anal lesion   Postoperative diagnosis: third  degree hemorrhoids.  Procedure: exam under anesthesia, two column hemorrhoidectomy.  Surgeon: Rosea Conch  Anesthesia: general  Specimen: hemorrhoids x2  Complications: none  EBL: 15mL  Wound classification: Clean Contaminated  Indications: Patient is a 78 y.o. male was found to have symptomatic hemorrhoids refractory to medical management.   Findings: 1. third  degree hemorrhoids 2. Internal and external anal sphincter palpated and preserved 3. Adequate hemostasis  Description of procedure: The patient was brought to the operating room and general anesthesia was induced. Patient was placed high lithotomy position. A time-out was completed verifying correct patient, procedure, site, positioning, and implant(s) and/or special equipment prior to beginning this procedure. The perineum was prepped and draped in standard sterile fashion. Local anesthetic was injected as a perianal block. An anoscope was introduced and hemorrhoidal pedicles identified. Hemorrhoid external component had some lobular appearance initially concerning for condyloma, but looks more like chronic skin changes.  Decision made to proceed with two column hemorrhoidectomy.  A harmonic was placed across the base of the left pedicle and excess hemorrhoidal tissue removed.  Specimen was passed off operative field pending pathology.  3-0 Vicryl then used to close the open wound.  A harmonic was placed across the base of the right posterior pedicle and excess hemorrhoidal tissue removed.  Specimen was passed off operative field pending pathology.  3-0 Vicryl then used to close the open wound.  Hemostasis achieved with additional 3-0 vicryl suture in areas of bleeding until all active bleeding controlled.  Last inspection of the anal canal did not note any additional hemorrhoidal tissue and no other pathology. Exparel  injected as a perianal block. Surgifoam  placed in anal canal. A gauze pad was tucked between the gluteal folds, and secured in place with mesh underwear.  The patient tolerated the procedure well and was taken to the postanesthesia care unit in stable condition.  Sponge and instrument count correct at end of procedure.

## 2023-09-09 NOTE — Interval H&P Note (Signed)
 History and Physical Interval Note:  09/09/2023 11:11 AM  Henry [name]  has presented today for surgery, with the diagnosis of K62.9 EUA biopsy of anal lesion.  The various methods of treatment have been discussed with the patient and family. After consideration of risks, benefits and other options for treatment, the patient has consented to  Procedure(s): EXAM UNDER ANESTHESIA, RECTUM (N/A) as a surgical intervention.  The patient's history has been reviewed, patient examined, no change in status, stable for surgery.  I have reviewed the patient's chart and labs.  Questions were answered to the patient's satisfaction.     [name]

## 2023-09-09 NOTE — Anesthesia Procedure Notes (Signed)
 Procedure Name: MAC Date/Time: 09/09/2023 11:31 AM  Performed by: Donnamae Gaba, CRNAPre-anesthesia Checklist: Patient identified, Emergency Drugs available, Suction available, Patient being monitored and Timeout performed Patient Re-evaluated:Patient Re-evaluated prior to induction Oxygen Delivery Method: Nasal cannula Induction Type: IV induction Placement Confirmation: positive ETCO2 and CO2 detector

## 2023-09-09 NOTE — Discharge Instructions (Signed)
 Removal, Care After This sheet gives you information about how to care for yourself after your procedure. Your health care provider may also give you more specific instructions. If you have problems or questions, contact your health care provider. What can I expect after the procedure? After the procedure, it is common to have: Soreness. Bruising. Itching.  Follow these instructions at home: site care Follow instructions from your health care provider about how to take care of your site. Make sure you: LEAVE packing in place until it falls out on its own.  No need to replace afterwards Leave stitches (sutures), skin glue, or adhesive strips in place.  If the area bleeds or bruises, apply gentle pressure for 10 minutes. OK TO SHOWER IN 24HRS  General instructions Rest and then return to your normal activities as told by your health care provider. tylenol  and advil as needed for discomfort.  Please alternate between the two every four hours as needed for pain.    Use narcotics, if prescribed, only when tylenol  and motrin is not enough to control pain.  325-650mg  every 8hrs to max of 3000mg /24hrs (including the 325mg  in every norco dose) for the tylenol .    Advil up to 800mg  per dose every 8hrs as needed for pain.   Keep all follow-up visits as told by your health care provider. This is important. Contact a health care provider if you have excessive: redness, swelling, or pain around your site. blood coming from your site. pus or a bad smell coming from your site. You have a fever.  Get help right away if: You have bleeding that does not stop with pressure or a dressing. Summary After the procedure, it is common to have some soreness, bruising, and itching at the site. Follow instructions from your health care provider about how to take care of your site. Keep all follow-up visits as told by your health care provider. This is important. This information is not intended to replace  advice given to you by your health care provider. Make sure you discuss any questions you have with your health care provider. Document Released: 05/30/2015 Document Revised: 10/31/2017 Document Reviewed: 10/31/2017 Elsevier Interactive Patient Education  [employer].

## 2023-09-09 NOTE — Anesthesia Postprocedure Evaluation (Signed)
 Anesthesia Post Note  Patient: Henry [name]  Procedure(s) Performed: EXAM UNDER ANESTHESIA, RECTUM (Rectum)  Patient location during evaluation: PACU Anesthesia Type: General Level of consciousness: awake and alert Pain management: pain level controlled Vital Signs Assessment: post-procedure vital signs reviewed and stable Respiratory status: spontaneous breathing, nonlabored ventilation and respiratory function stable Cardiovascular status: blood pressure returned to baseline and stable Postop Assessment: no apparent nausea or vomiting Anesthetic complications: no   No notable events documented.   Last Vitals:  Vitals:   09/09/23 1330 09/09/23 1355  BP: 130/78 126/69  Pulse: 60 64  Resp: 11 16  Temp: (!) 36.1 C (!) 36.4 C  SpO2: 100% 100%    Last Pain:  Vitals:   09/09/23 1355  TempSrc: Temporal  PainSc: 2                  Baltazar Bonier

## 2023-09-09 NOTE — Anesthesia Preprocedure Evaluation (Addendum)
 Anesthesia Evaluation  Patient identified by MRN, date of birth, ID band Patient awake    Reviewed: Allergy & Precautions, H&P , NPO status , Patient's Chart, lab work & pertinent test results  Airway Mallampati: II  TM Distance: >3 FB Neck ROM: full    Dental no notable dental hx. 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 No date: Cataract No date: GERD (gastroesophageal reflux disease) No date: Hyperlipidemia No date: Hypertension No date: Pneumonia  Past Surgical History: No date: CATARACT EXTRACTION; Bilateral No date: COLONOSCOPY 06/30/2023: COLONOSCOPY WITH PROPOFOL ; N/A     Comment:  Procedure: COLONOSCOPY WITH PROPOFOL ;  Surgeon: Quintin Buckle, DO;  Location: [facility] ENDOSCOPY;  Service:               Endoscopy;  Laterality: N/A; No date: MOLE REMOVAL No date: NECK MASS EXCISION 06/30/2023: POLYPECTOMY     Comment:  Procedure: POLYPECTOMY;  Surgeon: Quintin Buckle,              DO;  Location: [facility] ENDOSCOPY;  Service: Endoscopy;; 12/29/2016: PORTACATH PLACEMENT; N/A     Comment:  Procedure: INSERTION PORT-A-CATH;  Surgeon: Jerlean Mood, MD;  Location: [facility] ORS;  Service:               General;  Laterality: N/A; No date: TONSILLECTOMY 11/11/2020: TOTAL KNEE ARTHROPLASTY; Left     Comment:   Procedure: LEFT TOTAL KNEE ARTHROPLASTY;  Surgeon:               Rande Bushy, MD;  Location: [facility] ORS;  Service:               Orthopedics;  Laterality: Left;     Reproductive/Obstetrics negative OB ROS                             Anesthesia Physical Anesthesia Plan  ASA: 2  Anesthesia Plan: General   Post-op Pain Management: Regional block* and Tylenol  PO (pre-op)*   Induction: Intravenous  PONV Risk Score and Plan: Propofol  infusion and TIVA  Airway Management Planned: Natural Airway  Additional Equipment:   Intra-op Plan:   Post-operative Plan:   Informed Consent: I have reviewed the patients History and Physical, chart, labs and discussed the procedure including the risks, benefits and alternatives for the proposed anesthesia with the patient or authorized representative who has indicated his/her understanding and acceptance.     Dental Advisory Given  Plan Discussed with: CRNA and Surgeon  Anesthesia Plan Comments:         Anesthesia Quick Evaluation

## 2023-09-09 NOTE — Transfer of Care (Signed)
 Immediate Anesthesia Transfer of Care Note  Patient: Henry [name]  Procedure(s) Performed: EXAM UNDER ANESTHESIA, RECTUM (Rectum)  Patient Location: PACU  Anesthesia Type:General  Level of Consciousness: awake  Airway & Oxygen Therapy: Patient Spontanous Breathing  Post-op Assessment: Report given to RN and Post -op Vital signs reviewed and stable  Post vital signs: Reviewed and stable  Last Vitals:  Vitals Value Taken Time  BP 114/69 09/09/23 1233  Temp    Pulse 64 09/09/23 1234  Resp 14 09/09/23 1234  SpO2 95 % 09/09/23 1234  Vitals shown include unfiled device data.  Last Pain:  Vitals:   09/09/23 1033  PainSc: 0-No pain         Complications: No notable events documented.

## 2023-09-09 NOTE — H&P (Signed)
 CC: Anal lesion [K62.9]  HPI: Henry [name] is a 78 y.o. male who was referred by Mamie Searles for evaluation of above. First noted during recent colonoscopy.  Past Medical History: has a past medical history of Hypertension.  Past Surgical History: has a past surgical history that includes CORN REMOVED; MOLE REMOVED; KNOT REMOVAL OFF NECK; and Colon @ [facility] (06/30/2023).  Family History: family history includes ALS in his father; Heart disease in his father and mother; Rheumatic fever in his mother.  Social History: reports that he has never smoked. He has never used smokeless tobacco. He reports current alcohol use. He reports that he does not use drugs.  Current Medications: has a current medication list which includes the following prescription(s): amlodipine , aspirin , cetirizine, glucosamine hcl, latanoprost, lisinopril , meloxicam, omeprazole , rosuvastatin, sodium, potassium, and magnesium , timolol maleate, and vitamin d3-vitamin k2 (mk4).  Allergies:  Allergies  Allergen Reactions  Codeine Other (See Comments)  Gi upset  Lovastatin Unknown  Pravastatin Unknown  joint ache  Simvastatin Unknown  muscle ache  Sulfa (Sulfonamide Antibiotics) Itching   ROS:  A 15 point review of systems was performed and pertinent positives and negatives noted in HPI  Objective:    BP (!) 147/81  Pulse 68  Ht 188 cm (6\' 2" )  Wt (!) 102.1 kg (225 lb)  BMI 28.89 kg/m   Constitutional : No distress, cooperative, alert  Lymphatics/Throat: Supple with no lymphadenopathy  Respiratory: Clear to auscultation bilaterally  Cardiovascular: Regular rate and rhythm  Gastrointestinal: Soft, non-tender, non-distended, no organomegaly.  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 Will proceed with removal to confirm and also formal EUA for  reassessment of any additional lesions.  Plan:    1. Anal lesion [K62.9] Discussed surgical excision. Alternatives include continued observation. Benefits include possible symptom relief, pathologic evaluation, improved cosmesis. Discussed the risk of surgery including recurrence, chronic pain, post-op infxn, poor cosmesis, poor/delayed wound healing, and possible re-operation to address said risks. The risks of general anesthetic, if used, includes MI, CVA, sudden death or even reaction to anesthetic medications also discussed.  Typical post-op recovery time of 3-5 days with possible activity restrictions were also discussed.  The patient verbalized understanding and all questions were answered to the patient's satisfaction.  2. Patient has elected to proceed with surgical treatment. Procedure will be scheduled.  labs/images/medications/previous chart entries reviewed personally and relevant changes/updates noted above

## 2023-09-10 ENCOUNTER — Encounter: Payer: Self-pay | Admitting: Surgery

## 2023-09-12 LAB — SURGICAL PATHOLOGY

## 2023-10-12 DIAGNOSIS — K6282 Dysplasia of anus: Secondary | ICD-10-CM | POA: Diagnosis not present

## 2023-12-22 DIAGNOSIS — H401132 Primary open-angle glaucoma, bilateral, moderate stage: Secondary | ICD-10-CM | POA: Diagnosis not present

## 2023-12-22 DIAGNOSIS — H26493 Other secondary cataract, bilateral: Secondary | ICD-10-CM | POA: Diagnosis not present

## 2024-01-12 DIAGNOSIS — E7800 Pure hypercholesterolemia, unspecified: Secondary | ICD-10-CM | POA: Diagnosis not present

## 2024-01-12 DIAGNOSIS — Z0000 Encounter for general adult medical examination without abnormal findings: Secondary | ICD-10-CM | POA: Diagnosis not present
